# Patient Record
Sex: Male | Born: 1935 | Race: White | Hispanic: No | Marital: Married | Smoking: Never smoker
Health system: Southern US, Community
[De-identification: ages and names within clinical notes are randomized; demographics above are authoritative.]

## PROBLEM LIST (undated history)

## (undated) DIAGNOSIS — I420 Dilated cardiomyopathy: Secondary | ICD-10-CM

## (undated) DIAGNOSIS — I10 Essential (primary) hypertension: Secondary | ICD-10-CM

## (undated) DIAGNOSIS — I209 Angina pectoris, unspecified: Secondary | ICD-10-CM

## (undated) DIAGNOSIS — E785 Hyperlipidemia, unspecified: Secondary | ICD-10-CM

## (undated) DIAGNOSIS — I8393 Asymptomatic varicose veins of bilateral lower extremities: Secondary | ICD-10-CM

## (undated) DIAGNOSIS — I252 Old myocardial infarction: Secondary | ICD-10-CM

## (undated) DIAGNOSIS — M109 Gout, unspecified: Secondary | ICD-10-CM

## (undated) DIAGNOSIS — I2510 Atherosclerotic heart disease of native coronary artery without angina pectoris: Secondary | ICD-10-CM

## (undated) DIAGNOSIS — I255 Ischemic cardiomyopathy: Secondary | ICD-10-CM

## (undated) DIAGNOSIS — I5042 Chronic combined systolic (congestive) and diastolic (congestive) heart failure: Secondary | ICD-10-CM

## (undated) DIAGNOSIS — E039 Hypothyroidism, unspecified: Secondary | ICD-10-CM

## (undated) HISTORY — PX: CHOLECYSTECTOMY: SHX55

## (undated) HISTORY — PX: VASCULAR SURGERY: SHX849

---

## 2012-03-09 DEATH — deceased

## 2012-10-09 DIAGNOSIS — I5042 Chronic combined systolic (congestive) and diastolic (congestive) heart failure: Secondary | ICD-10-CM

## 2012-10-09 HISTORY — DX: Chronic combined systolic (congestive) and diastolic (congestive) heart failure: I50.42

## 2017-04-27 ENCOUNTER — Emergency Department (HOSPITAL_COMMUNITY): Payer: Medicare (Managed Care)

## 2017-04-27 ENCOUNTER — Inpatient Hospital Stay (HOSPITAL_COMMUNITY): Payer: Medicare (Managed Care)

## 2017-04-27 ENCOUNTER — Encounter (HOSPITAL_COMMUNITY): Payer: Self-pay | Admitting: Emergency Medicine

## 2017-04-27 ENCOUNTER — Other Ambulatory Visit: Payer: Self-pay

## 2017-04-27 ENCOUNTER — Inpatient Hospital Stay (HOSPITAL_COMMUNITY)
Admission: EM | Admit: 2017-04-27 | Discharge: 2017-05-15 | DRG: 233 | Disposition: A | Discharge status: Home Health | Payer: Medicare (Managed Care) | Attending: Thoracic Surgery (Cardiothoracic Vascular Surgery) | Admitting: Thoracic Surgery (Cardiothoracic Vascular Surgery)

## 2017-04-27 DIAGNOSIS — E039 Hypothyroidism, unspecified: Secondary | ICD-10-CM | POA: Diagnosis present

## 2017-04-27 DIAGNOSIS — I420 Dilated cardiomyopathy: Secondary | ICD-10-CM | POA: Diagnosis present

## 2017-04-27 DIAGNOSIS — R310 Gross hematuria: Secondary | ICD-10-CM | POA: Diagnosis not present

## 2017-04-27 DIAGNOSIS — Z8679 Personal history of other diseases of the circulatory system: Secondary | ICD-10-CM | POA: Diagnosis not present

## 2017-04-27 DIAGNOSIS — Z955 Presence of coronary angioplasty implant and graft: Secondary | ICD-10-CM | POA: Diagnosis not present

## 2017-04-27 DIAGNOSIS — R748 Abnormal levels of other serum enzymes: Secondary | ICD-10-CM

## 2017-04-27 DIAGNOSIS — I472 Ventricular tachycardia: Secondary | ICD-10-CM | POA: Diagnosis present

## 2017-04-27 DIAGNOSIS — M109 Gout, unspecified: Secondary | ICD-10-CM | POA: Diagnosis present

## 2017-04-27 DIAGNOSIS — I350 Nonrheumatic aortic (valve) stenosis: Secondary | ICD-10-CM | POA: Diagnosis present

## 2017-04-27 DIAGNOSIS — I4510 Unspecified right bundle-branch block: Secondary | ICD-10-CM | POA: Diagnosis present

## 2017-04-27 DIAGNOSIS — I252 Old myocardial infarction: Secondary | ICD-10-CM

## 2017-04-27 DIAGNOSIS — I5043 Acute on chronic combined systolic (congestive) and diastolic (congestive) heart failure: Secondary | ICD-10-CM

## 2017-04-27 DIAGNOSIS — Z8249 Family history of ischemic heart disease and other diseases of the circulatory system: Secondary | ICD-10-CM | POA: Diagnosis not present

## 2017-04-27 DIAGNOSIS — I10 Essential (primary) hypertension: Secondary | ICD-10-CM | POA: Diagnosis present

## 2017-04-27 DIAGNOSIS — R739 Hyperglycemia, unspecified: Secondary | ICD-10-CM | POA: Diagnosis present

## 2017-04-27 DIAGNOSIS — Z01810 Encounter for preprocedural cardiovascular examination: Secondary | ICD-10-CM | POA: Diagnosis not present

## 2017-04-27 DIAGNOSIS — I4892 Unspecified atrial flutter: Secondary | ICD-10-CM | POA: Diagnosis present

## 2017-04-27 DIAGNOSIS — I509 Heart failure, unspecified: Secondary | ICD-10-CM | POA: Diagnosis not present

## 2017-04-27 DIAGNOSIS — J810 Acute pulmonary edema: Secondary | ICD-10-CM

## 2017-04-27 DIAGNOSIS — I110 Hypertensive heart disease with heart failure: Secondary | ICD-10-CM | POA: Diagnosis present

## 2017-04-27 DIAGNOSIS — Z8739 Personal history of other diseases of the musculoskeletal system and connective tissue: Secondary | ICD-10-CM | POA: Diagnosis not present

## 2017-04-27 DIAGNOSIS — I255 Ischemic cardiomyopathy: Secondary | ICD-10-CM | POA: Diagnosis present

## 2017-04-27 DIAGNOSIS — I481 Persistent atrial fibrillation: Secondary | ICD-10-CM | POA: Diagnosis not present

## 2017-04-27 DIAGNOSIS — Z8639 Personal history of other endocrine, nutritional and metabolic disease: Secondary | ICD-10-CM | POA: Diagnosis not present

## 2017-04-27 DIAGNOSIS — I361 Nonrheumatic tricuspid (valve) insufficiency: Secondary | ICD-10-CM | POA: Diagnosis not present

## 2017-04-27 DIAGNOSIS — I77810 Thoracic aortic ectasia: Secondary | ICD-10-CM | POA: Diagnosis present

## 2017-04-27 DIAGNOSIS — Z79899 Other long term (current) drug therapy: Secondary | ICD-10-CM

## 2017-04-27 DIAGNOSIS — D62 Acute posthemorrhagic anemia: Secondary | ICD-10-CM | POA: Diagnosis not present

## 2017-04-27 DIAGNOSIS — I25110 Atherosclerotic heart disease of native coronary artery with unstable angina pectoris: Secondary | ICD-10-CM | POA: Diagnosis present

## 2017-04-27 DIAGNOSIS — I2510 Atherosclerotic heart disease of native coronary artery without angina pectoris: Secondary | ICD-10-CM

## 2017-04-27 DIAGNOSIS — I4891 Unspecified atrial fibrillation: Secondary | ICD-10-CM | POA: Diagnosis not present

## 2017-04-27 DIAGNOSIS — G4733 Obstructive sleep apnea (adult) (pediatric): Secondary | ICD-10-CM | POA: Diagnosis present

## 2017-04-27 DIAGNOSIS — J9811 Atelectasis: Secondary | ICD-10-CM | POA: Diagnosis not present

## 2017-04-27 DIAGNOSIS — M1A9XX0 Chronic gout, unspecified, without tophus (tophi): Secondary | ICD-10-CM

## 2017-04-27 DIAGNOSIS — I214 Non-ST elevation (NSTEMI) myocardial infarction: Secondary | ICD-10-CM | POA: Diagnosis present

## 2017-04-27 DIAGNOSIS — E785 Hyperlipidemia, unspecified: Secondary | ICD-10-CM

## 2017-04-27 DIAGNOSIS — R0600 Dyspnea, unspecified: Secondary | ICD-10-CM | POA: Diagnosis present

## 2017-04-27 DIAGNOSIS — Z7902 Long term (current) use of antithrombotics/antiplatelets: Secondary | ICD-10-CM

## 2017-04-27 DIAGNOSIS — J9601 Acute respiratory failure with hypoxia: Secondary | ICD-10-CM | POA: Diagnosis present

## 2017-04-27 DIAGNOSIS — I5042 Chronic combined systolic (congestive) and diastolic (congestive) heart failure: Secondary | ICD-10-CM | POA: Diagnosis not present

## 2017-04-27 DIAGNOSIS — R0902 Hypoxemia: Secondary | ICD-10-CM

## 2017-04-27 DIAGNOSIS — I351 Nonrheumatic aortic (valve) insufficiency: Secondary | ICD-10-CM | POA: Diagnosis not present

## 2017-04-27 DIAGNOSIS — Z951 Presence of aortocoronary bypass graft: Secondary | ICD-10-CM

## 2017-04-27 DIAGNOSIS — R079 Chest pain, unspecified: Secondary | ICD-10-CM

## 2017-04-27 DIAGNOSIS — Z7982 Long term (current) use of aspirin: Secondary | ICD-10-CM

## 2017-04-27 DIAGNOSIS — I8393 Asymptomatic varicose veins of bilateral lower extremities: Secondary | ICD-10-CM | POA: Diagnosis present

## 2017-04-27 DIAGNOSIS — I25118 Atherosclerotic heart disease of native coronary artery with other forms of angina pectoris: Secondary | ICD-10-CM

## 2017-04-27 HISTORY — DX: Dilated cardiomyopathy: I42.0

## 2017-04-27 HISTORY — DX: Hyperlipidemia, unspecified: E78.5

## 2017-04-27 HISTORY — DX: Old myocardial infarction: I25.2

## 2017-04-27 HISTORY — DX: Gout, unspecified: M10.9

## 2017-04-27 HISTORY — DX: Ischemic cardiomyopathy: I25.5

## 2017-04-27 HISTORY — DX: Hypothyroidism, unspecified: E03.9

## 2017-04-27 HISTORY — DX: Chronic combined systolic (congestive) and diastolic (congestive) heart failure: I50.42

## 2017-04-27 HISTORY — DX: Essential (primary) hypertension: I10

## 2017-04-27 HISTORY — DX: Atherosclerotic heart disease of native coronary artery without angina pectoris: I25.10

## 2017-04-27 HISTORY — DX: Asymptomatic varicose veins of bilateral lower extremities: I83.93

## 2017-04-27 HISTORY — DX: Angina pectoris, unspecified: I20.9

## 2017-04-27 LAB — HEPATIC FUNCTION PANEL
ALBUMIN: 4.3 g/dL (ref 3.5–5.0)
ALT: 22 U/L (ref 17–63)
AST: 28 U/L (ref 15–41)
Alkaline Phosphatase: 71 U/L (ref 38–126)
BILIRUBIN INDIRECT: 0.9 mg/dL (ref 0.3–0.9)
Bilirubin, Direct: 0.1 mg/dL (ref 0.1–0.5)
TOTAL PROTEIN: 7.6 g/dL (ref 6.5–8.1)
Total Bilirubin: 1 mg/dL (ref 0.3–1.2)

## 2017-04-27 LAB — TROPONIN I: Troponin I: 37.52 ng/mL (ref <0.03)

## 2017-04-27 LAB — POCT I-STAT TROPONIN I: Troponin i, poc: 28.77 ng/mL (ref 0.00–0.08)

## 2017-04-27 LAB — D-DIMER, QUANTITATIVE: D-Dimer, Quant: 9.17 ug/mL-FEU — ABNORMAL HIGH (ref 0.00–0.50)

## 2017-04-27 LAB — ECHOCARDIOGRAM COMPLETE
Height: 71 in
Weight: 3121.71 oz

## 2017-04-27 LAB — CBG MONITORING, ED: GLUCOSE-CAPILLARY: 185 mg/dL — AB (ref 65–99)

## 2017-04-27 LAB — BASIC METABOLIC PANEL
ANION GAP: 14 (ref 5–15)
BUN: 20 mg/dL (ref 6–20)
CALCIUM: 8.9 mg/dL (ref 8.9–10.3)
CO2: 22 mmol/L (ref 22–32)
Chloride: 105 mmol/L (ref 101–111)
Creatinine, Ser: 1.15 mg/dL (ref 0.61–1.24)
GFR calc Af Amer: >60 mL/min (ref >60)
GFR, EST NON AFRICAN AMERICAN: 55 mL/min — AB (ref >60)
GLUCOSE: 213 mg/dL — AB (ref 65–99)
POTASSIUM: 4.1 mmol/L (ref 3.5–5.1)
SODIUM: 141 mmol/L (ref 135–145)

## 2017-04-27 LAB — CBC
HCT: 50.3 % (ref 39.0–52.0)
Hemoglobin: 16.6 g/dL (ref 13.0–17.0)
MCH: 30.9 pg (ref 26.0–34.0)
MCHC: 33 g/dL (ref 30.0–36.0)
MCV: 93.5 fL (ref 78.0–100.0)
PLATELETS: 159 10*3/uL (ref 150–400)
RBC: 5.38 MIL/uL (ref 4.22–5.81)
RDW: 13.8 % (ref 11.5–15.5)
WBC: 10.6 10*3/uL — AB (ref 4.0–10.5)

## 2017-04-27 LAB — I-STAT TROPONIN, ED: TROPONIN I, POC: 0.03 ng/mL (ref 0.00–0.08)

## 2017-04-27 LAB — BRAIN NATRIURETIC PEPTIDE: B Natriuretic Peptide: 608 pg/mL — ABNORMAL HIGH (ref 0.0–100.0)

## 2017-04-27 LAB — TSH: TSH: 0.713 u[IU]/mL (ref 0.350–4.500)

## 2017-04-27 MED ORDER — ENOXAPARIN SODIUM 100 MG/ML ~~LOC~~ SOLN
1.0000 mg/kg | Freq: Two times a day (BID) | SUBCUTANEOUS | Status: DC
  Administered 2017-04-27: 90 mg via SUBCUTANEOUS
  Filled 2017-04-27: qty 1

## 2017-04-27 MED ORDER — IOPAMIDOL (ISOVUE-370) INJECTION 76%
100.0000 mL | Freq: Once | INTRAVENOUS | Status: AC | PRN
  Administered 2017-04-27: 100 mL via INTRAVENOUS

## 2017-04-27 MED ORDER — HEPARIN (PORCINE) IN NACL 100-0.45 UNIT/ML-% IJ SOLN
1200.0000 [IU]/h | INTRAMUSCULAR | Status: DC
  Administered 2017-04-28 – 2017-04-29 (×2): 1000 [IU]/h via INTRAVENOUS
  Administered 2017-04-30: 1200 [IU]/h via INTRAVENOUS
  Filled 2017-04-27 (×3): qty 250

## 2017-04-27 MED ORDER — PRAVASTATIN SODIUM 40 MG PO TABS
40.0000 mg | ORAL_TABLET | Freq: Every day | ORAL | Status: DC
  Administered 2017-04-27: 40 mg via ORAL
  Filled 2017-04-27: qty 1

## 2017-04-27 MED ORDER — FLUTICASONE PROPIONATE 0.005 % EX OINT
TOPICAL_OINTMENT | Freq: Two times a day (BID) | CUTANEOUS | Status: DC
  Administered 2017-04-27: 17:00:00 via TOPICAL
  Administered 2017-04-28: 1 via TOPICAL
  Administered 2017-04-28 – 2017-05-06 (×17): via TOPICAL
  Filled 2017-04-27 (×2): qty 30

## 2017-04-27 MED ORDER — ATORVASTATIN CALCIUM 80 MG PO TABS
80.0000 mg | ORAL_TABLET | Freq: Every day | ORAL | Status: DC
  Administered 2017-04-28 – 2017-05-14 (×15): 80 mg via ORAL
  Filled 2017-04-27 (×15): qty 1

## 2017-04-27 MED ORDER — ASPIRIN 81 MG PO CHEW
324.0000 mg | CHEWABLE_TABLET | Freq: Once | ORAL | Status: AC
  Administered 2017-04-27: 324 mg via ORAL
  Filled 2017-04-27: qty 4

## 2017-04-27 MED ORDER — FLUTICASONE PROPIONATE 0.05 % EX CREA: TOPICAL_CREAM | Freq: Two times a day (BID) | CUTANEOUS | Status: DC

## 2017-04-27 MED ORDER — ADULT MULTIVITAMIN W/MINERALS CH
1.0000 | ORAL_TABLET | Freq: Every day | ORAL | Status: DC
  Administered 2017-04-27 – 2017-05-11 (×14): 1 via ORAL
  Filled 2017-04-27 (×14): qty 1

## 2017-04-27 MED ORDER — SODIUM CHLORIDE 0.9% FLUSH
3.0000 mL | Freq: Two times a day (BID) | INTRAVENOUS | Status: DC
  Administered 2017-04-27 – 2017-05-07 (×14): 3 mL via INTRAVENOUS

## 2017-04-27 MED ORDER — ASPIRIN EC 81 MG PO TBEC
81.0000 mg | DELAYED_RELEASE_TABLET | Freq: Every day | ORAL | Status: DC
  Administered 2017-04-28 – 2017-05-08 (×10): 81 mg via ORAL
  Filled 2017-04-27 (×10): qty 1

## 2017-04-27 MED ORDER — ALLOPURINOL 100 MG PO TABS
100.0000 mg | ORAL_TABLET | Freq: Every day | ORAL | Status: DC
Start: 2017-04-27 — End: 2017-05-15
  Administered 2017-04-27 – 2017-05-15 (×18): 100 mg via ORAL
  Filled 2017-04-27 (×18): qty 1

## 2017-04-27 MED ORDER — ENALAPRIL MALEATE 5 MG PO TABS
5.0000 mg | ORAL_TABLET | Freq: Every day | ORAL | Status: DC
  Administered 2017-04-27 – 2017-05-02 (×6): 5 mg via ORAL
  Filled 2017-04-27 (×6): qty 1

## 2017-04-27 MED ORDER — NITROGLYCERIN 0.4 MG SL SUBL
0.4000 mg | SUBLINGUAL_TABLET | SUBLINGUAL | Status: DC | PRN
  Administered 2017-04-28 – 2017-05-03 (×3): 0.4 mg via SUBLINGUAL
  Filled 2017-04-27 (×4): qty 1

## 2017-04-27 MED ORDER — NITROGLYCERIN 2 % TD OINT
0.5000 [in_us] | TOPICAL_OINTMENT | Freq: Four times a day (QID) | TRANSDERMAL | Status: DC
  Administered 2017-04-27: 0.5 [in_us] via TOPICAL
  Filled 2017-04-27: qty 1
  Filled 2017-04-27: qty 30

## 2017-04-27 MED ORDER — FUROSEMIDE 10 MG/ML IJ SOLN
20.0000 mg | Freq: Once | INTRAMUSCULAR | Status: AC
  Administered 2017-04-27: 20 mg via INTRAVENOUS
  Filled 2017-04-27: qty 2

## 2017-04-27 MED ORDER — POTASSIUM CHLORIDE CRYS ER 20 MEQ PO TBCR
20.0000 meq | EXTENDED_RELEASE_TABLET | Freq: Two times a day (BID) | ORAL | Status: DC
  Administered 2017-04-27 (×2): 20 meq via ORAL
  Filled 2017-04-27 (×2): qty 1

## 2017-04-27 MED ORDER — SODIUM CHLORIDE 0.9% FLUSH: 3.0000 mL | INTRAVENOUS | Status: DC | PRN

## 2017-04-27 MED ORDER — IPRATROPIUM BROMIDE 0.02 % IN SOLN
RESPIRATORY_TRACT | Status: AC
  Administered 2017-04-27: 0.5 mg
  Filled 2017-04-27: qty 2.5

## 2017-04-27 MED ORDER — ACETAMINOPHEN 325 MG PO TABS
650.0000 mg | ORAL_TABLET | ORAL | Status: DC | PRN
  Administered 2017-05-03: 650 mg via ORAL
  Filled 2017-04-27: qty 2

## 2017-04-27 MED ORDER — ONDANSETRON HCL 4 MG/2ML IJ SOLN
4.0000 mg | Freq: Four times a day (QID) | INTRAMUSCULAR | Status: DC | PRN
  Administered 2017-04-29: 4 mg via INTRAVENOUS
  Filled 2017-04-27: qty 2

## 2017-04-27 MED ORDER — LEVOTHYROXINE SODIUM 75 MCG PO TABS
150.0000 ug | ORAL_TABLET | Freq: Every day | ORAL | Status: DC
  Administered 2017-04-28 – 2017-05-15 (×17): 150 ug via ORAL
  Filled 2017-04-27: qty 6
  Filled 2017-04-27 (×16): qty 2

## 2017-04-27 MED ORDER — LEVALBUTEROL HCL 1.25 MG/0.5ML IN NEBU
INHALATION_SOLUTION | RESPIRATORY_TRACT | Status: AC
  Administered 2017-04-27: 1.25 mg
  Filled 2017-04-27: qty 0.5

## 2017-04-27 MED ORDER — ASPIRIN 81 MG PO CHEW
81.0000 mg | CHEWABLE_TABLET | Freq: Every day | ORAL | Status: DC
  Administered 2017-04-27: 81 mg via ORAL
  Filled 2017-04-27: qty 1

## 2017-04-27 MED ORDER — FUROSEMIDE 10 MG/ML IJ SOLN
40.0000 mg | Freq: Two times a day (BID) | INTRAMUSCULAR | Status: DC
  Administered 2017-04-27: 40 mg via INTRAVENOUS
  Filled 2017-04-27: qty 4

## 2017-04-27 MED ORDER — NITROGLYCERIN IN D5W 200-5 MCG/ML-% IV SOLN
10.0000 ug/min | INTRAVENOUS | Status: DC
  Administered 2017-04-27: 10 ug/min via INTRAVENOUS
  Filled 2017-04-27: qty 250

## 2017-04-27 MED ORDER — SODIUM CHLORIDE 0.9 % IV SOLN: 250.0000 mL | INTRAVENOUS | Status: DC | PRN

## 2017-04-27 MED ORDER — FUROSEMIDE 10 MG/ML IJ SOLN: 40.0000 mg | Freq: Once | INTRAMUSCULAR | Status: DC

## 2017-04-27 MED ORDER — CARVEDILOL 6.25 MG PO TABS
6.2500 mg | ORAL_TABLET | Freq: Two times a day (BID) | ORAL | Status: DC
  Administered 2017-04-27 – 2017-05-09 (×23): 6.25 mg via ORAL
  Filled 2017-04-27: qty 2
  Filled 2017-04-27 (×21): qty 1
  Filled 2017-04-27: qty 2

## 2017-04-27 NOTE — Progress Notes (Signed)
Addendum:  CT angiogram was negative for pulmonary embolism. RN paged me with markedly elevated troponin. Patient already on therapeutic dose Lovenox due to high pretest probability of pulmonary embolism. We'll add Nitropaste. Called and spoke with Dr. Dietrich Pates to upgrade consultation to urgent request. Updated family and the patient about concerns that he may have had a heart attack.  [name] 04/27/2017 3:57 PM

## 2017-04-27 NOTE — ED Notes (Signed)
Patient helped to bedside commode. 

## 2017-04-27 NOTE — ED Triage Notes (Signed)
Patient start having chest pain and shortness of breath a couple hours ago, patient notably winded and diaphoretic.

## 2017-04-27 NOTE — H&P (Signed)
History and Physical:    Scott [name]   ZOX:[id number] DOB: 12/01/1926 DOA: 04/27/2017  Referring MD/provider: Melene Plan, DO PCP: System, Pcp Not In  Dr. Maple Hudson in [place]  Patient coming from: [facility]. Lives in [place].  Chief Complaint: Dyspnea  History of Present Illness:   Scott [name] is an 83 y.o. male with a PMH of congestive heart failure, coronary artery disease status post MI and stents 2, obstructive sleep apnea on CPAP for the past month, history of angina, history of controlled hypertension and hypothyroidism who presents to the ED with a chief complaint of the sudden onset of extreme dyspnea that began approximately at 2:15 AM after he got up to use the bathroom in the middle of the night. The patient denies any prior history of orthopnea or paroxysmal nocturnal dyspnea prior to the onset of these symptoms. Interestingly, he and his wife traveled here from [place] earlier this week, and there were multiple flight delays resulting in prolonged travel.  ED Course:  The patient was noted to be cyanotic, hypertensive, tachycardic, tachypneic and hypoxic upon arrival. He was placed on BiPAP, given 20 mg of Lasix IV, and placed on a nitroglycerin drip. BNP was 608, troponin was 0.03. There has been improvement in his symptoms since that time, and he has been weaned to nasal cannula.  ROS:   Review of Systems  Constitutional: Negative for chills, fever and weight loss.  HENT: Negative.   Eyes: Negative.   Respiratory: Positive for shortness of breath and wheezing. Negative for cough.   Cardiovascular: Negative for chest pain, palpitations, orthopnea, claudication, leg swelling and PND.  Gastrointestinal: Positive for diarrhea. Negative for blood in stool, melena, nausea and vomiting.  Genitourinary: Negative.   Musculoskeletal: Negative.   Skin: Positive for rash.       Nickel sized patch of erythema with flaking skin to right upper chest  Neurological:  Negative.   Endo/Heme/Allergies: Negative.   Psychiatric/Behavioral: The patient is nervous/anxious.     Past Medical History:   Past Medical History:  Diagnosis Date  . Angina pectoris (HCC)   . CHF (congestive heart failure) (HCC)   . Coronary artery disease   . Essential hypertension   . Gout   . History of MI (myocardial infarction)   . Hyperlipemia   . Hyperlipidemia   . Hypothyroidism   . Ischemic dilated cardiomyopathy (HCC)   . Multi-vessel coronary artery stenosis   . Varicose veins of both lower extremities     Past Surgical History:   Past Surgical History:  Procedure Laterality Date  . CHOLECYSTECTOMY    . VASCULAR SURGERY     Vein stripping    Social History:   Social History   Social History  . Marital status: Married    Spouse name: N/A  . Number of children: 3  . Years of education: N/A   Occupational History  . Not on file.   Social History Main Topics  . Smoking status: Never Smoker  . Smokeless tobacco: Not on file  . Alcohol use Yes     Comment: Social  . Drug use: No  . Sexual activity: Not on file   Other Topics Concern  . Not on file   Social History Narrative   Remarried, with current wife 1 year. Has 3 children with his former wife. Visiting from [place].    Allergies   Patient has no known allergies.  Family history:   Family History  Problem Relation Age  of Onset  . Heart disease Mother        Pacemaker  . Bladder Cancer Father   . Bladder Cancer Brother   . Hypertension Son     Current Medications:   Prior to Admission medications   Medication Sig Start Date End Date Taking? Authorizing Provider  allopurinol (ZYLOPRIM) 100 MG tablet Take 100 mg by mouth daily.   Yes [provider]  aspirin 81 MG chewable tablet Chew 81 mg by mouth daily.   Yes [provider]  carvedilol (COREG) 6.25 MG tablet Take 6.25 mg by mouth 2 (two) times daily with a meal.   Yes [provider]  enalapril  (VASOTEC) 5 MG tablet Take 5 mg by mouth at bedtime.   Yes [provider]  fluticasone (CUTIVATE) 0.05 % cream Apply topically 2 (two) times daily.   Yes [provider]  isosorbide mononitrate (IMDUR) 30 MG 24 hr tablet Take 30 mg by mouth daily.   Yes [provider]  levothyroxine (SYNTHROID, LEVOTHROID) 150 MCG tablet Take 150 mcg by mouth daily before breakfast.   Yes [provider]  Multiple Vitamin (MULTIVITAMIN) tablet Take 1 tablet by mouth daily.   Yes [provider]  nitroGLYCERIN (NITROSTAT) 0.4 MG SL tablet Place 0.4 mg under the tongue every 5 (five) minutes as needed for chest pain.   Yes [provider]  pravastatin (PRAVACHOL) 40 MG tablet Take 40 mg by mouth at bedtime.   Yes [provider]    Physical Exam:   Vitals:   04/27/17 0545 04/27/17 0600 04/27/17 0630 04/27/17 0630  BP: 106/67 128/72 (!) 146/72 128/72  Pulse: 68 69 74 72  Resp: (!) 22 20  (!) 23  SpO2: 97% 94% 93% 94%  Weight:      Height:         Physical Exam: Blood pressure 128/72, pulse 72, resp. rate (!) 23, height 5\' 11"  (1.803 m), weight 88 kg (194 lb), SpO2 94 %. Gen: Well-developed, well-nourished male. Mildly short of breath on initial appearance.  Head: Normocephalic, atraumatic. Eyes: Pupils equal, round and reactive to light. Extraocular movements intact.  Sclerae nonicteric. No lid lag. Mouth: Oropharynx reveals moist mucous membranes. Dentition is fair, bridge to the upper palate. Neck: Supple, no thyromegaly, no lymphadenopathy, very mild jugular venous distention. Chest: Lungs are coarse with diffuse expiratory wheezes. No rales or rhonchi. CV: Heart sounds are slightly irregular with premature beats. Grade 2/6 systolic murmur. Abdomen: Soft, nontender, nondistended with normal active bowel sounds. No hepatosplenomegaly or palpable masses. Extremities: Lower extremities are mildly cyanotic with extensive varicosities and  hemosiderin deposits. Skin: Warm and dry. Small patch of what appears to be eczema right upper chest. Neuro: Alert and oriented times 3; cranial nerves II through XII are grossly intact. Moves all extremities 4 with equal strength. Psych: Insight is good and judgment is appropriate. Mood and affect normal.   Data Review:    Labs: Basic Metabolic Panel:  Recent Labs Lab 04/27/17 0444  NA 141  K 4.1  CL 105  CO2 22  GLUCOSE 213*  BUN 20  CREATININE 1.15  CALCIUM 8.9   Liver Function Tests: No results for input(s): AST, ALT, ALKPHOS, BILITOT, PROT, ALBUMIN in the last 168 hours. No results for input(s): LIPASE, AMYLASE in the last 168 hours. No results for input(s): AMMONIA in the last 168 hours. CBC:  Recent Labs Lab 04/27/17 0444  WBC 10.6*  HGB 16.6  HCT 50.3  MCV  93.5  PLT 159   Cardiac Enzymes: No results for input(s): CKTOTAL, CKMB, CKMBINDEX, TROPONINI in the last 168 hours.  BNP (last 3 results) No results for input(s): PROBNP in the last 8760 hours. CBG:  Recent Labs Lab 04/27/17 0457  GLUCAP 185*    Urinalysis No results found for: COLORURINE, APPEARANCEUR, LABSPEC, PHURINE, GLUCOSEU, HGBUR, BILIRUBINUR, KETONESUR, PROTEINUR, UROBILINOGEN, NITRITE, LEUKOCYTESUR    Radiographic Studies:  Dg Chest Port 1 View 04/27/2017: Personally reviewed. Lung fields show interstitial opacities as pictured below.    EKG: Independently reviewed. Atrial flutter with an intraventricular conduction delay. PVCs. Q waves in V1.   Assessment/Plan:   Principal Problem:   Acute respiratory failure with hypoxia (HCC)In a patient with known ischemic dilated cardiomyopathy/CHF/CAD/history of MI Required BiPAP initially. Differential includes decompensated CHF, ACS, pulmonary embolism. Screening troponin reassuring, but with new atrial flutter and CHF, will need to rule out with serial enzymes. At high risk for pulmonary embolism given recent travel history, d-dimer  and stat CT angiogram of the chest have been ordered. We'll place on empiric Lovenox until ACS and pulmonary embolism have been ruled out. Continue aspirin, Coreg, and a nitroglycerin drip for now. Hold Imdur while on NTG drip. Cardiology consultation has been requested. 2-D echo also ordered.  Active Problems:   Atrial flutter/abnormal EKG Have placed on therapeutic dose Lovenox. Cycle cardiac enzymes. Cardiology consulted.    Hypothyroidism Check TSH and continue Synthroid for now.    Hyperlipidemia Continue statin.    Gout Continue allopurinol.    Essential hypertension Blood pressure markedly elevated on presentation, improved with nitroglycerin drip. Continue home antihypertensives and nitroglycerin drip for now.    Hyperglycemia No history of diabetes but glucose 213 on chemistries. Nonfasting sample. We'll check hemoglobin A1c.   Other information:   DVT prophylaxis: Lovenox ordered. Code Status: Full code. Family Communication: Wife at bedside.  Disposition Plan: Admit to telemetry, anticipate a 2-3 hospital stay for diagnostic testing and stabilization. Consults called: Cardiology. Admission status: Inpatient.   The medical decision making on this patient was of high complexity and the patient is at high risk for clinical deterioration, therefore this is a level 3 visit.   RAMA,CHRISTINA [facility] Pager [phone] Cell: [phone]   If 7PM-7AM, please contact night-coverage [email or web address] Password [email or web address] 04/27/2017, 8:29 AM

## 2017-04-27 NOTE — ED Provider Notes (Addendum)
[facility]-EMERGENCY DEPT Provider Note   CSN: [id number] Arrival date & time: 04/27/17  0439     History   Chief Complaint Chief Complaint  Patient presents with  . Shortness of Breath    HPI Scott [name] is a 83 y.o. male.  Level 5 caveat acute respiratory distress. The patient came in with approximately 2 hours of extreme shortness of breath.  Later history obtained from family.   The history is provided by the patient and the spouse.  Illness  This is a new problem. The current episode started 1 to 2 hours ago. The problem occurs constantly. The problem has been rapidly worsening. Associated symptoms include shortness of breath. Pertinent negatives include no chest pain, no abdominal pain and no headaches. Nothing aggravates the symptoms. Nothing relieves the symptoms. He has tried nothing for the symptoms. The treatment provided no relief.    Past Medical History:  Diagnosis Date  . Angina pectoris (HCC)   . CHF (congestive heart failure) (HCC)   . Coronary artery disease   . History of MI (myocardial infarction)   . Hyperlipemia   . Ischemic dilated cardiomyopathy (HCC)   . Multi-vessel coronary artery stenosis     There are no active problems to display for this patient.   No past surgical history on file.     Home Medications    Prior to Admission medications   Medication Sig Start Date End Date Taking? Authorizing Provider  allopurinol (ZYLOPRIM) 100 MG tablet Take 100 mg by mouth daily.   Yes [provider]  aspirin 81 MG chewable tablet Chew 81 mg by mouth daily.   Yes [provider]  carvedilol (COREG) 6.25 MG tablet Take 6.25 mg by mouth 2 (two) times daily with a meal.   Yes [provider]  enalapril (VASOTEC) 5 MG tablet Take 5 mg by mouth at bedtime.   Yes [provider]  fluticasone (CUTIVATE) 0.05 % cream Apply topically 2 (two) times daily.   Yes [provider]  isosorbide mononitrate (IMDUR) 30  MG 24 hr tablet Take 30 mg by mouth daily.   Yes [provider]  levothyroxine (SYNTHROID, LEVOTHROID) 150 MCG tablet Take 150 mcg by mouth daily before breakfast.   Yes [provider]  Multiple Vitamin (MULTIVITAMIN) tablet Take 1 tablet by mouth daily.   Yes [provider]  nitroGLYCERIN (NITROSTAT) 0.4 MG SL tablet Place 0.4 mg under the tongue every 5 (five) minutes as needed for chest pain.   Yes [provider]  pravastatin (PRAVACHOL) 40 MG tablet Take 40 mg by mouth at bedtime.   Yes [provider]    Family History No family history on file.  Social History Social History  Substance Use Topics  . Smoking status: Not on file  . Smokeless tobacco: Not on file  . Alcohol use Not on file     Allergies   Patient has no known allergies.   Review of Systems Review of Systems  Unable to perform ROS: Acuity of condition  Constitutional: Negative for chills and fever.  HENT: Negative for congestion and facial swelling.   Eyes: Negative for discharge and visual disturbance.  Respiratory: Positive for shortness of breath.   Cardiovascular: Negative for chest pain and palpitations.  Gastrointestinal: Negative for abdominal pain, diarrhea and vomiting.  Musculoskeletal: Negative for arthralgias and myalgias.  Skin: Negative for color change and rash.  Neurological: Negative for tremors, syncope and headaches.  Psychiatric/Behavioral: Negative for confusion and dysphoric  mood.     Physical Exam Updated Vital Signs BP (!) 167/95 Comment: Simultaneous filing. User may not have seen previous data.  Pulse 83 Comment: Simultaneous filing. User may not have seen previous data.  Resp (!) 35 Comment: Simultaneous filing. User may not have seen previous data.  Ht 5\' 11"  (1.803 m)   Wt 88 kg (194 lb)   SpO2 98% Comment: Simultaneous filing. User may not have seen previous data.  BMI 27.06 kg/m   Physical Exam  Constitutional: He is  oriented to person, place, and time. He appears well-developed and well-nourished. He appears distressed.  HENT:  Head: Normocephalic and atraumatic.  jvd up to the angle of the jaw  Eyes: Pupils are equal, round, and reactive to light. EOM are normal.  Neck: Normal range of motion. Neck supple. No JVD present.  Cardiovascular: Normal rate and regular rhythm.  Exam reveals no gallop and no friction rub.   No murmur heard. Pulmonary/Chest: No respiratory distress. He has wheezes.  Diffuse rales up to the top of the lungs   Abdominal: He exhibits no distension and no mass. There is no tenderness. There is no rebound and no guarding.  Musculoskeletal: Normal range of motion. He exhibits edema (1+ to the knees).  Neurological: He is alert and oriented to person, place, and time.  Skin: No rash noted. He is diaphoretic. No pallor.  Psychiatric: He has a normal mood and affect. His behavior is normal.  Nursing note and vitals reviewed.    ED Treatments / Results  Labs (all labs ordered are listed, but only abnormal results are displayed) Labs Reviewed  BASIC METABOLIC PANEL - Abnormal; Notable for the following:       Result Value   Glucose, Bld 213 (*)    GFR calc non Af Amer 55 (*)    All other components within normal limits  CBC - Abnormal; Notable for the following:    WBC 10.6 (*)    All other components within normal limits  BRAIN NATRIURETIC PEPTIDE - Abnormal; Notable for the following:    B Natriuretic Peptide 608.0 (*)    All other components within normal limits  CBG MONITORING, ED - Abnormal; Notable for the following:    Glucose-Capillary 185 (*)    All other components within normal limits  I-STAT TROPONIN, ED    EKG  EKG Interpretation None       Radiology Dg Chest Port 1 View  Result Date: 04/27/2017 CLINICAL DATA:  Shortness of breath and chest pain EXAM: PORTABLE CHEST 1 VIEW COMPARISON:  None. FINDINGS: There are extensive bilateral interstitial  opacities. Cardiomediastinal contours are normal. No pneumothorax or sizable pleural effusion. IMPRESSION: Extensive bilateral interstitial opacities which may indicate moderate interstitial pulmonary edema. In the absence of prior studies for comparison, interstitial lung disease would be difficult to exclude. Electronically Signed   By: Deatra Robinson M.D.   On: 04/27/2017 05:45    Procedures Procedures (including critical care time)  Medications Ordered in ED Medications  nitroGLYCERIN 50 mg in dextrose 5 % 250 mL (0.2 mg/mL) infusion (0 mcg/min Intravenous Stopped 04/27/17 0541)  aspirin chewable tablet 324 mg (324 mg Oral Given 04/27/17 0548)  ipratropium (ATROVENT) 0.02 % nebulizer solution (0.5 mg  Given 04/27/17 0459)  levalbuterol (XOPENEX) 1.25 MG/0.5ML nebulizer solution (1.25 mg  Given 04/27/17 0459)  furosemide (LASIX) injection 20 mg (20 mg Intravenous Given 04/27/17 0541)     Initial Impression / Assessment and Plan / ED Course  I have reviewed the triage vital signs and the nursing notes.  Pertinent labs & imaging results that were available during my care of the patient were reviewed by me and considered in my medical decision making (see chart for details).     83 yo M with a chief complaint of shortness of breath. The started acutely about 2 hours ago and significantly worsening. On arrival to the ED the patient was cyanotic diaphoretic and in respiratory distress. O2 sat was in the 60s. Exam with diffuse rales. Start on BiPAP with significant improvement. Noted to be hypertensive as well. Started on nitroglycerin drip. Chest x-ray consistent with acute pulmonary edema.  The patient's blood pressure titrated down on nitroglycerin. Patient's blood pressure now in the 120s over 70s. Taken off of BiPAP and tolerating nasal cannula well. Will discuss with the hospitalist.  CRITICAL CARE Performed by: Rae Roam   Total critical care time: 80 minutes  Critical care  time was exclusive of separately billable procedures and treating other patients.  Critical care was necessary to treat or prevent imminent or life-threatening deterioration.  Critical care was time spent personally by me on the following activities: development of treatment plan with patient and/or surrogate as well as nursing, discussions with consultants, evaluation of patient's response to treatment, examination of patient, obtaining history from patient or surrogate, ordering and performing treatments and interventions, ordering and review of laboratory studies, ordering and review of radiographic studies, pulse oximetry and re-evaluation of patient's condition.  The patients results and plan were reviewed and discussed.   Any x-rays performed were independently reviewed by myself.   Differential diagnosis were considered with the presenting HPI.  Medications  nitroGLYCERIN 50 mg in dextrose 5 % 250 mL (0.2 mg/mL) infusion (0 mcg/min Intravenous Stopped 04/27/17 0541)  aspirin chewable tablet 324 mg (324 mg Oral Given 04/27/17 0548)  ipratropium (ATROVENT) 0.02 % nebulizer solution (0.5 mg  Given 04/27/17 0459)  levalbuterol (XOPENEX) 1.25 MG/0.5ML nebulizer solution (1.25 mg  Given 04/27/17 0459)  furosemide (LASIX) injection 20 mg (20 mg Intravenous Given 04/27/17 0541)    Vitals:   04/27/17 0451 04/27/17 0452 04/27/17 0459 04/27/17 0500  BP: (!) 152/108   (!) 167/95  Pulse: (!) 101   83  Resp: (!) 29   (!) 35  SpO2: (!) 74%  98% 98%  Weight:  88 kg (194 lb)    Height:  5\' 11"  (1.803 m)      Final diagnoses:  Acute pulmonary edema (HCC)    Admission/ observation were discussed with the admitting physician, patient and/or family and they are comfortable with the plan.   Final Clinical Impressions(s) / ED Diagnoses   Final diagnoses:  Acute pulmonary edema [facility])    New Prescriptions New Prescriptions   No medications on file     Melene Plan, DO 04/27/17 1610    Melene Plan, DO 04/27/17 [phone]

## 2017-04-27 NOTE — Progress Notes (Signed)
Patient taken off BIPAP at 05:50 per MD. Patient's work of breathing is much better. He is laughing and smiling and states that he can breath much better. Placed on 4 lpm nasal cannula at this time and O2 sat is 100%. Family at bedside.

## 2017-04-27 NOTE — Progress Notes (Signed)
ANTICOAGULATION CONSULT NOTE - Initial Consult  Pharmacy Consult for Lovenox>>>Heparin  Indication: chest pain/ACS  No Known Allergies  Patient Measurements: Height: 5' 10.5" (179.1 cm) Weight: 188 lb 4.8 oz (85.4 kg) IBW/kg (Calculated) : 74.15  Vital Signs: Temp: 98.2 F (36.8 C) (07/20 2138) Temp Source: Oral (07/20 2138) BP: 146/61 (07/20 2138) Pulse Rate: 70 (07/20 2138)  Labs:  Recent Labs  04/27/17 0444 04/27/17 1512  HGB 16.6  --   HCT 50.3  --   PLT 159  --   CREATININE 1.15  --   TROPONINI  --  37.52*    Estimated Creatinine Clearance: 47.5 mL/min (by C-G formula based on SCr of 1.15 mg/dL).   Medical History: Past Medical History:  Diagnosis Date  . Angina pectoris (HCC)   . CHF (congestive heart failure) (HCC)   . Coronary artery disease   . Essential hypertension   . Gout   . History of MI (myocardial infarction)   . Hyperlipemia   . Hyperlipidemia   . Hypothyroidism   . Ischemic dilated cardiomyopathy (HCC)   . Multi-vessel coronary artery stenosis   . Varicose veins of both lower extremities     Assessment: 83 y/o M tx from [facility] for elevated troponin, transitioning from Lovenox to Heparin drip, CBC/renal function good, last dose of Lovenox was around 2000 today.  Goal of Therapy:  Heparin level 0.3-0.7 units/ml Monitor platelets by anticoagulation protocol: Yes   Plan:  -Start heparin drip at 1000 units/hr at 0800 on 7/21 -1600 HL -Daily CBC/HL -Monitor for bleeding  Abran Duke 04/27/2017,11:43 PM

## 2017-04-27 NOTE — Progress Notes (Signed)
ANTICOAGULATION CONSULT NOTE - Initial Consult  Pharmacy Consult for lovenox Indication: pulmonary embolus  No Known Allergies  Patient Measurements: Height: 5\' 11"  (180.3 cm) Weight: 194 lb (88 kg) IBW/kg (Calculated) : 75.3   Vital Signs: BP: 128/72 (07/20 0630) Pulse Rate: 72 (07/20 0630)  Labs:  Recent Labs  04/27/17 0444  HGB 16.6  HCT 50.3  PLT 159  CREATININE 1.15    Estimated Creatinine Clearance: 48.2 mL/min (by C-G formula based on SCr of 1.15 mg/dL).   Medical History: Past Medical History:  Diagnosis Date  . Angina pectoris (HCC)   . CHF (congestive heart failure) (HCC)   . Coronary artery disease   . Essential hypertension   . Gout   . History of MI (myocardial infarction)   . Hyperlipemia   . Hyperlipidemia   . Hypothyroidism   . Ischemic dilated cardiomyopathy (HCC)   . Multi-vessel coronary artery stenosis   . Varicose veins of both lower extremities     Medications:  See medication history  Assessment: 83 yo man to start lovenox r/o PE.   Goal of Therapy:  Anti-Xa level 0.6-1 units/ml 4hrs after LMWH dose given Monitor platelets by anticoagulation protocol: Yes   Plan:  Lovenox 90 mg sq q12 hours CBC q 3 days  Monitor for bleeding complications  Aisea Bouldin Poteet 04/27/2017,8:33 AM

## 2017-04-27 NOTE — Consult Note (Signed)
Primary Physician: Primary Cardiologist:  New  Dr Sol Blazing ([facility]    Asked by Dr Darnelle Catalan to see for elevated troponin  HPI:  Pt is an 83 yo male with history ofCAD, AS CHF   thypthryodism  OSA, HTN   He lives in [place]   Cardiologist there is Dr Truddie Hidden, [facility] OR  Pt says he had an MI in 1987  Stent in 1999  Stent in 2013 or 2014   Plavix was discontinued rel recnetly  Had been on over 2 years  His angina is back pain  By internal medicine note: LVEF 25 to 40%  Not a candidate for ICD About 1 month ago he went to local ER with back pain  Told he had fluid around the heart  Given Rx for lasix and K for 20 days   After finished he started having back pain (angina for him) again  He says he takes isosorbid bid to prevent this If misses has more back pain The patieht left [place] Tues to come to  for a family reunion  Long trip  Last night woke up with severe back pain and SOB  Came to ED How he is breathing better  Still with mild back discomfort   Initial troponin:  0.03  Second troponin 28. EKG and tele with SR with frequent PVCs    The pt says that he is still having some back pain 4/10  Breathing is better "since they started working on me"       Past Medical History:  Diagnosis Date  . Angina pectoris (HCC)   . CHF (congestive heart failure) (HCC)   . Coronary artery disease   . Essential hypertension   . Gout   . History of MI (myocardial infarction)   . Hyperlipemia   . Hyperlipidemia   . Hypothyroidism   . Ischemic dilated cardiomyopathy (HCC)   . Multi-vessel coronary artery stenosis   . Varicose veins of both lower extremities     Medications Prior to Admission  Medication Sig Dispense Refill  . allopurinol (ZYLOPRIM) 100 MG tablet Take 100 mg by mouth daily.    Marland Kitchen aspirin 81 MG chewable tablet Chew 81 mg by mouth daily.    Marland Kitchen CALCIUM CARBONATE PO Take 600 mg by mouth 2 (two) times daily.    . carvedilol (COREG) 6.25 MG tablet Take 6.25 mg by mouth 2  (two) times daily with a meal.    . Cholecalciferol (VITAMIN D-1000 MAX ST) 1000 units tablet Take 2,000 Units by mouth every morning.    . cyanocobalamin (TH VITAMIN B12) 100 MCG tablet Take 1 tablet by mouth daily. Take 1 tablet by mouth in the evening.    . enalapril (VASOTEC) 5 MG tablet Take 5 mg by mouth at bedtime.    . fluticasone (CUTIVATE) 0.05 % cream Apply topically 2 (two) times daily.    . isosorbide mononitrate (IMDUR) 30 MG 24 hr tablet Take 30 mg by mouth daily.    Marland Kitchen levothyroxine (SYNTHROID, LEVOTHROID) 150 MCG tablet Take 150 mcg by mouth daily before breakfast.    . Lutein 20 MG CAPS Take 1 capsule by mouth every morning.    . Multiple Vitamin (MULTIVITAMIN) tablet Take 1 tablet by mouth daily.    . nitroGLYCERIN (NITROSTAT) 0.4 MG SL tablet Place 0.4 mg under the tongue every 5 (five) minutes as needed for chest pain.    . pravastatin (PRAVACHOL) 40 MG tablet Take 40 mg by mouth at  bedtime.    Marland Kitchen Ubiquinol 100 MG CAPS Take 1 capsule by mouth every morning.       Marland Kitchen allopurinol  100 mg Oral Daily  . aspirin  81 mg Oral Daily  . carvedilol  6.25 mg Oral BID WC  . enalapril  5 mg Oral QHS  . enoxaparin (LOVENOX) injection  1 mg/kg Subcutaneous Q12H  . fluticasone   Topical BID  . furosemide  40 mg Intravenous BID  . [START ON 04/28/2017] levothyroxine  150 mcg Oral QAC breakfast  . multivitamin with minerals  1 tablet Oral Daily  . potassium chloride  20 mEq Oral BID  . pravastatin  40 mg Oral QHS  . sodium chloride flush  3 mL Intravenous Q12H    Infusions: . sodium chloride    . nitroGLYCERIN Stopped (04/27/17 0541)    No Known Allergies  Social History   Social History  . Marital status: Married    Spouse name: N/A  . Number of children: 3  . Years of education: N/A   Occupational History  . Not on file.   Social History Main Topics  . Smoking status: Never Smoker  . Smokeless tobacco: Not on file  . Alcohol use Yes     Comment: Social  . Drug  use: No  . Sexual activity: Not on file   Other Topics Concern  . Not on file   Social History Narrative   Remarried, with current wife 1 year. Has 3 children with his former wife. Visiting from [place].    Family History  Problem Relation Age of Onset  . Heart disease Mother        Pacemaker  . Bladder Cancer Father   . Bladder Cancer Brother   . Hypertension Son     REVIEW OF SYSTEMS:  All systems reviewed  Negative to the above problem except as noted above.    PHYSICAL EXAM: Vitals:   04/27/17 1400 04/27/17 1500  BP: (!) 149/83 133/72  Pulse: 73 67  Resp: (!) 28 (!) 21  Temp:       Intake/Output Summary (Last 24 hours) at 04/27/17 1546 Last data filed at 04/27/17 1434  Gross per 24 hour  Intake                0 ml  Output              625 ml  Net             -625 ml    General:  Well appearing. No respiratory difficulty HEENT: normal Neck: supple. no JVD. Carotids 2+ bilat; no bruits. No lymphadenopathy or thryomegaly appreciated. Cor:  Regular rate & rhythm. No rubs, gallops Gr II/VI systolic murmur at base   Lungs: clear Abdomen: soft, nontender, nondistended. No hepatosplenomegaly. No bruits or masses. Good bowel sounds. Extremities: no cyanosis, clubbing, rash, edema Neuro: alert & oriented x 3, cranial nerves grossly intact. moves all 4 extremities w/o difficulty. Affect pleasant.  ECG:  Sinus tachycardia  106 bpm  RBBB  LAFB   Ventricular bige  Results for orders placed or performed during the hospital encounter of 04/27/17 (from the past 24 hour(s))  Basic metabolic panel     Status: Abnormal   Collection Time: 04/27/17  4:44 AM  Result Value Ref Range   Sodium 141 135 - 145 mmol/L   Potassium 4.1 3.5 - 5.1 mmol/L   Chloride 105 101 - 111 mmol/L   CO2 22 22 -  32 mmol/L   Glucose, Bld 213 (H) 65 - 99 mg/dL   BUN 20 6 - 20 mg/dL   Creatinine, Ser 4.54 0.61 - 1.24 mg/dL   Calcium 8.9 8.9 - 09.8 mg/dL   GFR calc non Af Amer 55 (L) >60 mL/min   GFR  calc Af Amer >60 >60 mL/min   Anion gap 14 5 - 15  CBC     Status: Abnormal   Collection Time: 04/27/17  4:44 AM  Result Value Ref Range   WBC 10.6 (H) 4.0 - 10.5 K/uL   RBC 5.38 4.22 - 5.81 MIL/uL   Hemoglobin 16.6 13.0 - 17.0 g/dL   HCT 11.9 14.7 - 82.9 %   MCV 93.5 78.0 - 100.0 fL   MCH 30.9 26.0 - 34.0 pg   MCHC 33.0 30.0 - 36.0 g/dL   RDW 56.2 13.0 - 86.5 %   Platelets 159 150 - 400 K/uL  Brain natriuretic peptide     Status: Abnormal   Collection Time: 04/27/17  4:44 AM  Result Value Ref Range   B Natriuretic Peptide 608.0 (H) 0.0 - 100.0 pg/mL  D-dimer, quantitative (not at [facility])     Status: Abnormal   Collection Time: 04/27/17  4:44 AM  Result Value Ref Range   D-Dimer, Quant 9.17 (H) 0.00 - 0.50 ug/mL-FEU  Hepatic function panel     Status: None   Collection Time: 04/27/17  4:44 AM  Result Value Ref Range   Total Protein 7.6 6.5 - 8.1 g/dL   Albumin 4.3 3.5 - 5.0 g/dL   AST 28 15 - 41 U/L   ALT 22 17 - 63 U/L   Alkaline Phosphatase 71 38 - 126 U/L   Total Bilirubin 1.0 0.3 - 1.2 mg/dL   Bilirubin, Direct 0.1 0.1 - 0.5 mg/dL   Indirect Bilirubin 0.9 0.3 - 0.9 mg/dL  I-stat troponin, ED (not at [facility])     Status: None   Collection Time: 04/27/17  4:51 AM  Result Value Ref Range   Troponin i, poc 0.03 0.00 - 0.08 ng/mL   Comment 3          CBG monitoring, ED     Status: Abnormal   Collection Time: 04/27/17  4:57 AM  Result Value Ref Range   Glucose-Capillary 185 (H) 65 - 99 mg/dL  POCT i-Stat troponin I     Status: Abnormal   Collection Time: 04/27/17  1:23 PM  Result Value Ref Range   Troponin i, poc 28.77 (HH) 0.00 - 0.08 ng/mL   Comment 3           Ct Angio Chest Pe W Or Wo Contrast  Result Date: 04/27/2017 CLINICAL DATA:  83 year old male with 2 hour history of extreme shortness of breath. EXAM: CT ANGIOGRAPHY CHEST WITH CONTRAST TECHNIQUE: Multidetector CT imaging of the chest was performed using the standard protocol during bolus administration of  intravenous contrast. Multiplanar CT image reconstructions and MIPs were obtained to evaluate the vascular anatomy. CONTRAST:  100 mL of Isovue 370. COMPARISON:  No priors. FINDINGS: Cardiovascular: No filling defects in the pulmonary tail tree to suggest underlying pulmonary embolism. Heart size is mildly enlarged. There is no significant pericardial fluid, thickening or pericardial calcification. There is aortic atherosclerosis, as well as atherosclerosis of the great vessels of the mediastinum and the coronary arteries, including calcified atherosclerotic plaque in the left main, left anterior descending, left circumflex and right coronary arteries. Extensive thinning and dystrophic calcifications in the left  ventricle involving anterior, anteroseptal and septal wall segments throughout the mid ventricle and apex compatible with extensive remottling and scarring following LAD territory myocardial infarction(s). Calcifications of the aortic valve. Ectasia of ascending thoracic aorta (4.3 cm in diameter). Mediastinum/Nodes: No pathologically enlarged mediastinal or hilar lymph nodes. Esophagus is unremarkable in appearance. No axillary lymphadenopathy. Lungs/Pleura: Small bilateral pleural effusions lying dependently. Widespread ground-glass attenuation and interlobular septal thickening throughout the lungs bilaterally is most compatible with a background of interstitial pulmonary edema. No confluent consolidative airspace disease. No definite suspicious appearing pulmonary nodules or masses are noted. Upper Abdomen: Aortic atherosclerosis. 4.1 cm exophytic simple cyst in the upper pole of the left kidney. Colonic diverticulosis. Status post cholecystectomy. Musculoskeletal: There are no aggressive appearing lytic or blastic lesions noted in the visualized portions of the skeleton. Review of the MIP images confirms the above findings. IMPRESSION: 1. No evidence of pulmonary embolism. 2. There are findings  suggestive of congestive heart failure, as discussed above. 3. Aortic atherosclerosis, in addition to left main and 3 vessel coronary artery disease. In addition, there is evidence of prior distal LAD territory myocardial infarction(s) with extensive scarring in the left ventricle. 4. There are calcifications of the aortic valve. Echocardiographic correlation for evaluation of potential valvular dysfunction may be warranted if clinically indicated. 5. Ectasia of the ascending thoracic aorta (4.3 cm in diameter). Recommend annual imaging followup by CTA or MRA. This recommendation follows 2010 ACCF/AHA/AATS/ACR/ASA/SCA/SCAI/SIR/STS/SVM Guidelines for the Diagnosis and Management of Patients with Thoracic Aortic Disease. Circulation. 2010; 121: O130-Q657. 6. Colonic diverticulosis. Aortic Atherosclerosis (ICD10-I70.0). Electronically Signed   By: Trudie Reed M.D.   On: 04/27/2017 09:01   Dg Chest Port 1 View  Result Date: 04/27/2017 CLINICAL DATA:  Shortness of breath and chest pain EXAM: PORTABLE CHEST 1 VIEW COMPARISON:  None. FINDINGS: There are extensive bilateral interstitial opacities. Cardiomediastinal contours are normal. No pneumothorax or sizable pleural effusion. IMPRESSION: Extensive bilateral interstitial opacities which may indicate moderate interstitial pulmonary edema. In the absence of prior studies for comparison, interstitial lung disease would be difficult to exclude. Electronically Signed   By: Deatra Robinson M.D.   On: 04/27/2017 05:45     ASSESSMENT: Pt is an 83 yo with known CAD , CHF  Recent CHF exacerbation in OR  Pt with continued angina  He did not have this last year. Presents with severe back pain and SOB   Improved but not completely resolved  EKG is nondiagnostic but with frequent PVCs Echo difficult windows  Will need definity  Preliminary the LVEF is sverely depressed   Labs signif for trop of 28 (up from 0.03 yesterday)  1  CAD  I would continue heparin and resume  Plavix  Continue NTG I have discussed tests results with pt  He says he knew something was wrong.  Agrees to recomm for L heart cath with possible intervention    Will plan for Tx to [facility] with Tx to cardiology service Tentatively plan for procedure on Monday unless pain worsens, cannot be controlled  2  CHF  Volume is not bad  Follow I/O  3  AS  I have seen images  Does not appear severe  Will review when echo done   4  HTN  Follow  5   OSA  Will follow  Not sure if using CPAP  6 HL  Check lipids    Dietrich Pates

## 2017-04-27 NOTE — Progress Notes (Addendum)
Called Avis Epley,  RN who was this patients nurse in the emergency room in regards to an elevated troponin level of 28.77. Asked Arlys John who drew this lab and was this critical lab delivered to the physician.  This troponin was drawn in the ED via ISTAT Per Arlys John and he would message Dr. Darnelle Catalan to make aware of this critical lab.

## 2017-04-27 NOTE — Progress Notes (Signed)
Patient ID: Scott [name], male   DOB: January 26, 1927, 83 y.o.   MRN: [id number]    CARDIOLOGY HISTORY & PHYSICAL   Referring Physician: Dr. Darnelle Catalan Primary Physician: Dr. Maple Hudson In [place] Primary Cardiologist: Dr. Sol Blazing in [place] Reason for Admission: NSTEMI   HPI: Please see prior note for additional details. Briefly, Scott [name] is an 83 yo man with PMH CAD s/p MI and stents, obstructive sleep apnea on CPAP, HTN, hypothyroidism who is transferred from [facility] to [facility] for NSTEMI.  The patient reports a gradual increase in his back pain frequency, which is his anginal equivalent, over the last few months. Also using nitroglycerin more frequently. Notes decreased exercise tolerance as well. He was traveling to a family reunion when he suddenly became acutely short of breath in the middle of the night last night. He was seen in the [facility] ER and treated for pulmonary edema and hypoxic respiratory failure. He improved greatly with diuretics and BiPAP. He did continue to have mild back pain through much of the day, and this has recently resolved.  The patient's additional history is reviewed through [facility] and summarized per Dr. Roxy Cedar note 10/25/16: -CAD s/p RCA stent 1999, MI in 2014 -ICM, EF as low as 25%, improved with medical management (most recent EF 40%) -hypothyroidism -dyslipidemia -gout  He reports recently stopping clopidogrel (believes stent was placed in 2014 and was on clopidogrel >2 years). Had been on isosorbide at home with PRN SL NG. Believes he had excess fluid about a month ago but was doing well recently.  On my interview he is comfortable in bed. Denies chest or back pain. Breathing at baseline. No complaints.  Denies syncope, palpitations. Weights stable. Prior to episode last evening no SOB, PND, orthopnea, edema.  Denies bleeding issues, no prior stroke or ICH.  Review of Systems:     Cardiac Review of Systems: {Y] = yes [ ]  = no  Chest Pain [  N   ] Anginal equivalent Y Resting SOB [ Y yesterday  ] Exertional SOB  [ Y ]  Orthopnea Scott [name]  ]   Pedal Edema [ N  ]    Palpitations [ N ] Syncope  [ N ]   Presyncope [ N  ]  General Review of Systems: [Y] = yes [  ]=no Constitional: recent weight change [  ]; anorexia [  ]; fatigue [  ]; nausea [  ]; night sweats [  ]; fever [  ]; or chills [  ];                                                                     Eyes : blurred vision [  ]; diplopia [   ]; vision changes [  ];  Amaurosis fugax[  ]; Resp: cough [ Y ];  wheezing[  ];  hemoptysis[  ];  PND [  ];  GI:  gallstones[  ], vomiting[  ];  dysphagia[  ]; melena[  ];  hematochezia [  ]; heartburn[  ];   GU: kidney stones [  ]; hematuria[  ];   dysuria [  ];  nocturia[  ]; incontinence [  ];  Skin: rash, swelling[  ];, hair loss[  ];  peripheral edema[  ];  or itching[  ]; Musculosketetal: myalgias[  ];  joint swelling[  ];  joint erythema[  ];  joint pain[  ];  back pain[  ];  Heme/Lymph: bruising[  ];  bleeding[  ];  anemia[  ];  Neuro: TIA[  ];  headaches[  ];  stroke[  ];  vertigo[  ];  seizures[  ];   paresthesias[  ];  difficulty walking[  ];  Psych:depression[  ]; anxiety[  ];  Endocrine: diabetes[  ];  thyroid dysfunction[  ];  Other:  Past Medical History:  Diagnosis Date  . Angina pectoris (HCC)   . CHF (congestive heart failure) (HCC)   . Coronary artery disease   . Essential hypertension   . Gout   . History of MI (myocardial infarction)   . Hyperlipemia   . Hyperlipidemia   . Hypothyroidism   . Ischemic dilated cardiomyopathy (HCC)   . Multi-vessel coronary artery stenosis   . Varicose veins of both lower extremities     Medications Prior to Admission  Medication Sig Dispense Refill  . allopurinol (ZYLOPRIM) 100 MG tablet Take 100 mg by mouth daily.    Marland Kitchen aspirin 81 MG chewable tablet Chew 81 mg by mouth daily.    Marland Kitchen CALCIUM CARBONATE PO Take 600 mg by mouth 2 (two) times daily.    . carvedilol (COREG) 6.25  MG tablet Take 6.25 mg by mouth 2 (two) times daily with a meal.    . Cholecalciferol (VITAMIN D-1000 MAX ST) 1000 units tablet Take 2,000 Units by mouth every morning.    . cyanocobalamin (TH VITAMIN B12) 100 MCG tablet Take 1 tablet by mouth daily. Take 1 tablet by mouth in the evening.    . enalapril (VASOTEC) 5 MG tablet Take 5 mg by mouth at bedtime.    . fluticasone (CUTIVATE) 0.05 % cream Apply topically 2 (two) times daily.    . isosorbide mononitrate (IMDUR) 30 MG 24 hr tablet Take 30 mg by mouth daily.    Marland Kitchen levothyroxine (SYNTHROID, LEVOTHROID) 150 MCG tablet Take 150 mcg by mouth daily before breakfast.    . Lutein 20 MG CAPS Take 1 capsule by mouth every morning.    . Multiple Vitamin (MULTIVITAMIN) tablet Take 1 tablet by mouth daily.    . nitroGLYCERIN (NITROSTAT) 0.4 MG SL tablet Place 0.4 mg under the tongue every 5 (five) minutes as needed for chest pain.    . pravastatin (PRAVACHOL) 40 MG tablet Take 40 mg by mouth at bedtime.    Marland Kitchen Ubiquinol 100 MG CAPS Take 1 capsule by mouth every morning.       Marland Kitchen allopurinol  100 mg Oral Daily  . aspirin  81 mg Oral Daily  . carvedilol  6.25 mg Oral BID WC  . enalapril  5 mg Oral QHS  . enoxaparin (LOVENOX) injection  1 mg/kg Subcutaneous Q12H  . fluticasone   Topical BID  . furosemide  40 mg Intravenous BID  . [START ON 04/28/2017] levothyroxine  150 mcg Oral QAC breakfast  . multivitamin with minerals  1 tablet Oral Daily  . nitroGLYCERIN  0.5 inch Topical Q6H  . potassium chloride  20 mEq Oral BID  . pravastatin  40 mg Oral QHS  . sodium chloride flush  3 mL Intravenous Q12H    Infusions: . sodium chloride    . nitroGLYCERIN Stopped (04/27/17 0541)    No Known Allergies  Social History   Social History  . Marital status: Married    Spouse name: N/A  . Number of children: 3  . Years of education: N/A   Occupational History  . Not on file.   Social History Main Topics  . Smoking status: Never Smoker  . Smokeless  tobacco: Not on file  . Alcohol use Yes     Comment: Social  . Drug use: No  . Sexual activity: Not on file   Other Topics Concern  . Not on file   Social History Narrative   Remarried, with current wife 1 year. Has 3 children with his former wife. Visiting from [place].    Family History  Problem Relation Age of Onset  . Heart disease Mother        Pacemaker  . Bladder Cancer Father   . Bladder Cancer Brother   . Hypertension Son     PHYSICAL EXAM: Vitals:   04/27/17 2000 04/27/17 2138  BP:  (!) 146/61  Pulse:  70  Resp:  (!) 24  Temp: 98.1 F (36.7 C) 98.2 F (36.8 C)     Intake/Output Summary (Last 24 hours) at 04/27/17 2322 Last data filed at 04/27/17 2301  Gross per 24 hour  Intake                0 ml  Output             1875 ml  Net            -1875 ml    General:  Well appearing. No respiratory difficulty HEENT: normal Neck: supple. no JVD. Carotids 2+ bilat; no bruits. No lymphadenopathy or thryomegaly appreciated. Cor: PMI nondisplaced. Regular rate & rhythm. No rubs, gallops. II/VI systolic murmur sternal border. Lungs: clear Abdomen: soft, nontender, nondistended. No hepatosplenomegaly. No bruits or masses. Good bowel sounds. Extremities: no cyanosis, clubbing, rash, edema Neuro: alert & oriented x 3, cranial nerves grossly intact. moves all 4 extremities w/o difficulty. Affect pleasant.  ECG: sinus with PVCs  Prior echo from 08/2016 from care[facility]where: Conclusions  Left ventricular systolic function is mildly reduced. Left ventricular ejection fraction is 40% (+/- 5). There is mild aortic stenosis in the setting of mildly reduced LV function, underestimating the degree of stenosis. Mild aortic insufficiency is demonstrated. The ascending aorta is mildly dilated. Compared to the prior echo report on 02/03/2014, there is no significant change.  Study Comments The study was diagnostic quality. 2D transthoracic echocardiogram with color flow  and Doppler. The study was technically adequate with some images being suboptimal in quality. Compared to the prior echo report on 02/03/2014, there is no significant change. The patient was in normal sinus rhythm during the exam.  Left Ventricle The left ventricle is normal in size. There is mild concentric left ventricular hypertrophy. There is apical akinesis. Left ventricular systolic function is mildly reduced. The left ventricular wall motion is globally hypokinetic. Left ventricular ejection fraction is 40% (+/- 5). Spectral Doppler of the mitral valve shows a normal E/A wave ratio. The E/E' ratio between the mitral E wave and thei mitral annulus E' wave is 10. There is diastolic dysfunction of uncertain severity.  Right Ventricle The right ventricle is normal in size and function. The tricuspid annular plane systolic excursion is 2.2 cm. A measurement of 1.6 cm or greater is consistent with normal right ventricular systolic function.  Atria The left and right atria are normal in size. The left atrial volume indexed to BSA  is calculated to be 20cc/m2. A normal value range for LA volume is 16-34 ml/m2. There is no Doppler evidence for an interatrial shunt.   Mitral Valve Mild mitral regurgitation is present.  Aortic Valve The aortic valve is calcified. There is borderline mild aortic stenosis. Mild aortic insufficiency is demonstrated.  Tricuspid Valve The tricuspid valve is normal in structure and function. There is a trace or physiologic amount of tricuspid regurgitation. Right ventricular systolic pressure is normal.  Pulmonic Valve The pulmonic valve is normal in structure and function.  Great Vessels The aortic root is normal size. The aortic arch is normal in size and function. The ascending aorta is dilated. The IVC appears to collapse at least 50% or more and is normal in size which suggests normal central venous pressures.   Pericardium / Pleura There is no  pericardial effusion.  Results for orders placed or performed during the hospital encounter of 04/27/17 (from the past 24 hour(s))  Basic metabolic panel     Status: Abnormal   Collection Time: 04/27/17  4:44 AM  Result Value Ref Range   Sodium 141 135 - 145 mmol/L   Potassium 4.1 3.5 - 5.1 mmol/L   Chloride 105 101 - 111 mmol/L   CO2 22 22 - 32 mmol/L   Glucose, Bld 213 (H) 65 - 99 mg/dL   BUN 20 6 - 20 mg/dL   Creatinine, Ser 2.44 0.61 - 1.24 mg/dL   Calcium 8.9 8.9 - 01.0 mg/dL   GFR calc non Af Amer 55 (L) >60 mL/min   GFR calc Af Amer >60 >60 mL/min   Anion gap 14 5 - 15  CBC     Status: Abnormal   Collection Time: 04/27/17  4:44 AM  Result Value Ref Range   WBC 10.6 (H) 4.0 - 10.5 K/uL   RBC 5.38 4.22 - 5.81 MIL/uL   Hemoglobin 16.6 13.0 - 17.0 g/dL   HCT 27.2 53.6 - 64.4 %   MCV 93.5 78.0 - 100.0 fL   MCH 30.9 26.0 - 34.0 pg   MCHC 33.0 30.0 - 36.0 g/dL   RDW 03.4 74.2 - 59.5 %   Platelets 159 150 - 400 K/uL  Brain natriuretic peptide     Status: Abnormal   Collection Time: 04/27/17  4:44 AM  Result Value Ref Range   B Natriuretic Peptide 608.0 (H) 0.0 - 100.0 pg/mL  D-dimer, quantitative (not at [facility])     Status: Abnormal   Collection Time: 04/27/17  4:44 AM  Result Value Ref Range   D-Dimer, Quant 9.17 (H) 0.00 - 0.50 ug/mL-FEU  Hepatic function panel     Status: None   Collection Time: 04/27/17  4:44 AM  Result Value Ref Range   Total Protein 7.6 6.5 - 8.1 g/dL   Albumin 4.3 3.5 - 5.0 g/dL   AST 28 15 - 41 U/L   ALT 22 17 - 63 U/L   Alkaline Phosphatase 71 38 - 126 U/L   Total Bilirubin 1.0 0.3 - 1.2 mg/dL   Bilirubin, Direct 0.1 0.1 - 0.5 mg/dL   Indirect Bilirubin 0.9 0.3 - 0.9 mg/dL  I-stat troponin, ED (not at [facility])     Status: None   Collection Time: 04/27/17  4:51 AM  Result Value Ref Range   Troponin i, poc 0.03 0.00 - 0.08 ng/mL   Comment 3          CBG monitoring, ED     Status: Abnormal   Collection Time:  04/27/17  4:57 AM  Result Value Ref  Range   Glucose-Capillary 185 (H) 65 - 99 mg/dL  POCT i-Stat troponin I     Status: Abnormal   Collection Time: 04/27/17  1:23 PM  Result Value Ref Range   Troponin i, poc 28.77 (HH) 0.00 - 0.08 ng/mL   Comment 3          TSH     Status: None   Collection Time: 04/27/17  3:12 PM  Result Value Ref Range   TSH 0.713 0.350 - 4.500 uIU/mL  Troponin I     Status: Abnormal   Collection Time: 04/27/17  3:12 PM  Result Value Ref Range   Troponin I 37.52 (HH) <0.03 ng/mL   Ct Angio Chest Pe W Or Wo Contrast  Result Date: 04/27/2017 CLINICAL DATA:  83 year old male with 2 hour history of extreme shortness of breath. EXAM: CT ANGIOGRAPHY CHEST WITH CONTRAST TECHNIQUE: Multidetector CT imaging of the chest was performed using the standard protocol during bolus administration of intravenous contrast. Multiplanar CT image reconstructions and MIPs were obtained to evaluate the vascular anatomy. CONTRAST:  100 mL of Isovue 370. COMPARISON:  No priors. FINDINGS: Cardiovascular: No filling defects in the pulmonary tail tree to suggest underlying pulmonary embolism. Heart size is mildly enlarged. There is no significant pericardial fluid, thickening or pericardial calcification. There is aortic atherosclerosis, as well as atherosclerosis of the great vessels of the mediastinum and the coronary arteries, including calcified atherosclerotic plaque in the left main, left anterior descending, left circumflex and right coronary arteries. Extensive thinning and dystrophic calcifications in the left ventricle involving anterior, anteroseptal and septal wall segments throughout the mid ventricle and apex compatible with extensive remottling and scarring following LAD territory myocardial infarction(s). Calcifications of the aortic valve. Ectasia of ascending thoracic aorta (4.3 cm in diameter). Mediastinum/Nodes: No pathologically enlarged mediastinal or hilar lymph nodes. Esophagus is unremarkable in appearance. No axillary  lymphadenopathy. Lungs/Pleura: Small bilateral pleural effusions lying dependently. Widespread ground-glass attenuation and interlobular septal thickening throughout the lungs bilaterally is most compatible with a background of interstitial pulmonary edema. No confluent consolidative airspace disease. No definite suspicious appearing pulmonary nodules or masses are noted. Upper Abdomen: Aortic atherosclerosis. 4.1 cm exophytic simple cyst in the upper pole of the left kidney. Colonic diverticulosis. Status post cholecystectomy. Musculoskeletal: There are no aggressive appearing lytic or blastic lesions noted in the visualized portions of the skeleton. Review of the MIP images confirms the above findings. IMPRESSION: 1. No evidence of pulmonary embolism. 2. There are findings suggestive of congestive heart failure, as discussed above. 3. Aortic atherosclerosis, in addition to left main and 3 vessel coronary artery disease. In addition, there is evidence of prior distal LAD territory myocardial infarction(s) with extensive scarring in the left ventricle. 4. There are calcifications of the aortic valve. Echocardiographic correlation for evaluation of potential valvular dysfunction may be warranted if clinically indicated. 5. Ectasia of the ascending thoracic aorta (4.3 cm in diameter). Recommend annual imaging followup by CTA or MRA. This recommendation follows 2010 ACCF/AHA/AATS/ACR/ASA/SCA/SCAI/SIR/STS/SVM Guidelines for the Diagnosis and Management of Patients with Thoracic Aortic Disease. Circulation. 2010; 121: W098-J191. 6. Colonic diverticulosis. Aortic Atherosclerosis (ICD10-I70.0). Electronically Signed   By: Trudie Reed M.D.   On: 04/27/2017 09:01   Dg Chest Port 1 View  Result Date: 04/27/2017 CLINICAL DATA:  Shortness of breath and chest pain EXAM: PORTABLE CHEST 1 VIEW COMPARISON:  None. FINDINGS: There are extensive bilateral interstitial opacities. Cardiomediastinal contours are normal. No  pneumothorax or sizable pleural effusion. IMPRESSION: Extensive bilateral interstitial opacities which may indicate moderate interstitial pulmonary edema. In the absence of prior studies for comparison, interstitial lung disease would be difficult to exclude. Electronically Signed   By: Deatra Robinson M.D.   On: 04/27/2017 05:45   ASSESSMENT/PLAN: Scott [name] is an 83 yo man with CAD s/p stents, ICM with last EF 40% who presented with respiratory distress (now resolved) and was found to have NSTEMI with second troponin of 37. Had back pain for much of day, which is his anginal equivalent, now resolved.  NSTEMI -received lovenox dosing, last this evening. Would start heparin drip once therapeutic lovenox window is past -changed pravastatin to atorvastatin -Dr. Tenny Craw recommended clopidogrel, will restart this -plan for cath Monday -heart healthy diet -continue carvedilol and enalapril -SL NG prn for his anginal equivalent--back pain -continue to monitor troponins to peak  OSA -ordered CPAP  HTN -controlled, carvedilol/enalapril as above  HLD -changed to high intensity statin  AS  -not severe on prior echo or by exam, echo results here pending  ICM, heart failure -euvolemic currently, dose lasix and potassium PRN -carvedilol and enalapril as above  Hypothyroidism -continue levothyroxine  FULL CODE, confirmed with patient  Jodelle Red, MD, PhD, cardiology overnight provide

## 2017-04-27 NOTE — ED Notes (Signed)
Pt assisted to bedside commode. Pt voided on the floor unable to obtain output at this time.

## 2017-04-27 NOTE — Progress Notes (Signed)
  Echocardiogram 2D Echocardiogram has been performed.  Draken Farrior T Lashun Ramseyer 04/27/2017, 6:12 PM

## 2017-04-27 NOTE — Progress Notes (Signed)
CRITICAL VALUE ALERT  Critical Value:  Troponin 37.52  Date & Time Notied:  04/27/17 1615  Provider Notified: Dr.Rama  Orders Received/Actions taken: awaiting callback

## 2017-04-27 NOTE — ED Notes (Addendum)
Date and time results received: 04/27/17 2:39 PM  (use smartphrase ".now" to insert current time)  Test: Istat Troponin  Critical Value: 28.77  Name of Provider Notified: Rama  Orders Received? Or Actions Taken?: Orders Received - See Orders for details

## 2017-04-28 ENCOUNTER — Inpatient Hospital Stay (HOSPITAL_COMMUNITY): Payer: Medicare (Managed Care)

## 2017-04-28 ENCOUNTER — Encounter (HOSPITAL_COMMUNITY): Payer: Self-pay

## 2017-04-28 DIAGNOSIS — I351 Nonrheumatic aortic (valve) insufficiency: Secondary | ICD-10-CM

## 2017-04-28 LAB — TROPONIN I
TROPONIN I: 16.19 ng/mL — AB (ref <0.03)
Troponin I: 16.39 ng/mL (ref <0.03)

## 2017-04-28 LAB — HEMOGLOBIN A1C
HEMOGLOBIN A1C: 5.5 % (ref 4.8–5.6)
MEAN PLASMA GLUCOSE: 111 mg/dL

## 2017-04-28 LAB — ECHOCARDIOGRAM LIMITED
HEIGHTINCHES: 70.5 in
Weight: 3005.31 oz

## 2017-04-28 LAB — HEPARIN LEVEL (UNFRACTIONATED): Heparin Unfractionated: 0.37 IU/mL (ref 0.30–0.70)

## 2017-04-28 LAB — MRSA PCR SCREENING: MRSA by PCR: NEGATIVE

## 2017-04-28 MED ORDER — FUROSEMIDE 10 MG/ML IJ SOLN
40.0000 mg | Freq: Once | INTRAMUSCULAR | Status: AC
  Administered 2017-04-28: 40 mg via INTRAVENOUS
  Filled 2017-04-28: qty 4

## 2017-04-28 MED ORDER — PERFLUTREN LIPID MICROSPHERE
1.0000 mL | INTRAVENOUS | Status: AC | PRN
  Administered 2017-04-28: 2 mL via INTRAVENOUS
  Filled 2017-04-28: qty 10

## 2017-04-28 MED ORDER — CLOPIDOGREL BISULFATE 75 MG PO TABS
75.0000 mg | ORAL_TABLET | Freq: Every day | ORAL | Status: DC
  Administered 2017-04-28 – 2017-05-01 (×4): 75 mg via ORAL
  Filled 2017-04-28 (×4): qty 1

## 2017-04-28 MED ORDER — CLOPIDOGREL BISULFATE 75 MG PO TABS
300.0000 mg | ORAL_TABLET | Freq: Once | ORAL | Status: AC
  Administered 2017-04-28: 300 mg via ORAL
  Filled 2017-04-28: qty 4

## 2017-04-28 NOTE — Progress Notes (Signed)
Called into room by patient's wife to assess urine. Patient had voided into urinal; urine was clear and yellow but very slightly blood tinged. There was a small amount of blood noted on wash cloth and a few drops of blood oozing from urethra. Notified on call MD; no new orders received at this time. Will continue to monitor closely.

## 2017-04-28 NOTE — Progress Notes (Signed)
*  PRELIMINARY RESULTS* Echocardiogram 2D Echocardiogram has been performed.  Scott [name] 04/28/2017, 11:51 AM

## 2017-04-28 NOTE — Progress Notes (Signed)
ANTICOAGULATION CONSULT NOTE - Follow Up Consult  Pharmacy Consult for Heparin Indication: NSTEMI  No Known Allergies  Patient Measurements: Height: 5' 10.5" (179.1 cm) Weight: 187 lb 13.3 oz (85.2 kg) IBW/kg (Calculated) : 74.15 Heparin Dosing Weight:    Vital Signs: Temp: 98.5 F (36.9 C) (07/21 1257) Temp Source: Oral (07/21 1257) BP: 120/64 (07/21 1156) Pulse Rate: 69 (07/21 1156)  Labs:  Recent Labs  04/27/17 0444 04/27/17 1512 04/28/17 1151 04/28/17 1641  HGB 16.6  --   --   --   HCT 50.3  --   --   --   PLT 159  --   --   --   HEPARINUNFRC  --   --   --  0.37  CREATININE 1.15  --   --   --   TROPONINI  --  37.52* 16.19*  --     Estimated Creatinine Clearance: 47.5 mL/min (by C-G formula based on SCr of 1.15 mg/dL).   Assessment:  Anticoag: Lovenox>>Heparin, PE has been ruled out with CT Angio Continue heparin for nstemi. HL 0.37 in goal range.   Goal of Therapy:  Heparin level 0.3-0.7 units/ml Monitor platelets by anticoagulation protocol: Yes   Plan:  Continue IV heparin at 1000 units/hr Daily HL and CBC   Takeysha Bonk S. Merilynn Finland, PharmD, BCPS Clinical Staff Pharmacist Pager [phone]  Misty Stanley Stillinger 04/28/2017,5:30 PM

## 2017-04-28 NOTE — Progress Notes (Signed)
Subjective:  He complains of a mild amount of back pain when he lays on his back.  He has no chest pain but evidently has not had chest pain previously.  Troponin was elevated yesterday but no repeat troponin has been noted.  Objective:  Vital Signs in the last 24 hours: BP 135/67   Pulse 66   Temp 98.2 F (36.8 C) (Oral)   Resp (!) 24   Ht 5' 10.5" (1.791 m)   Wt 85.2 kg (187 lb 13.3 oz)   SpO2 97%   BMI 26.57 kg/m   Physical Exam:  Lungs:  Clear Cardiac:  Regular rhythm, normal S1 and S2, no S3, 2/6 systolic murmur in aortic area Abdomen:  Soft, nontender, no masses Extremities:  No edema present  Intake/Output from previous day: 07/20 0701 - 07/21 0700 In: 240 [P.O.:240] Out: 2300 [Urine:2300]  Weight Filed Weights   04/27/17 1421 04/27/17 2138 04/28/17 0557  Weight: 88.5 kg (195 lb 1.7 oz) 85.4 kg (188 lb 4.8 oz) 85.2 kg (187 lb 13.3 oz)    Lab Results: Basic Metabolic Panel:  Recent Labs  05/23/47 0444  NA 141  K 4.1  CL 105  CO2 22  GLUCOSE 213*  BUN 20  CREATININE 1.15   CBC:  Recent Labs  04/27/17 0444  WBC 10.6*  HGB 16.6  HCT 50.3  MCV 93.5  PLT 159   Cardiac Enzymes: Troponin (Point of Care Test)  Recent Labs  04/27/17 1323  TROPIPOC 28.77*   Cardiac Panel (last 3 results)  Recent Labs  04/27/17 1512  TROPONINI 37.52*    Telemetry: Personally reviewed.  Sinus rhythm.  EKG Personally reviewed.  Lateral ST depression, occasional PVCs sinus rhythm.  Right bundle branch block, old anteroseptal infarction..  Assessment/Plan:  1.  Non-STEMI 2.  Chronic systolic heart failure 3.  Aortic stenosis 4.  Right bundle branch block  Recommendations:  We'll get serial troponins.  He is on heparin and complains of very minimal back pain.  Await echocardiogram to assess aortic stenosis today.      Darden Palmer  MD [facility]  04/28/2017, 11:02 AM

## 2017-04-28 NOTE — Progress Notes (Signed)
PROGRESS NOTE    Scott [name]  KZS:[id number] DOB: 03-09-1927 DOA: 04/27/2017 PCP: System, Pcp Not In     Brief Narrative:    83 y.o. male with a PMH of congestive heart failure, coronary artery disease status post MI and stents 2, obstructive sleep apnea on CPAP for the past month, history of angina, history of controlled hypertension and hypothyroidism who presents to the ED with a chief complaint of the sudden onset of extreme dyspnea .  Assessment & Plan:   1-non-ST elevation MI : Atypical presentation , EKG with a new ischemic changes and frequent PVC, troponin has peaked to 37, is currently chest pain-free his back pain is better, we'll continue medical management with heparin drip, asa/ Plavix/Coreg/statin/vasotec and NTG PRN , Plan for [facility] on Monday per cards . 2-history of ischemic cardiomyopathy  status post 2 stents in the past: He has a low EF, pending 2D cho reading to evaluate LVEF and AS , currently euvolemic . 3-Hx of HTN : Better controlled, continue beta blocker/ Vasotec. 4-hyperlipidemia continue statin 5-history of hypothyroidism continue synthroid.  DVT prophylaxis: (/anticoagulated Code Status: (Full) Family Communication: (wife) Disposition Plan: (home)   Consultants:   Cardiology  Procedures: none.  Antimicrobials: (specify start and planned stop date. Auto populated tables are space occupying and do not give end dates)  None       Subjective:  Reports improvement in his shortness of breath and back pain comparing to yesterday, denies any chest pain or fevers or chills or nausea or vomiting or abdominal pain.  Objective: Vitals:   04/27/17 2349 04/28/17 0557 04/28/17 0558 04/28/17 0843  BP:  129/68  135/67  Pulse: 64 63  66  Resp: 19 19  (!) 24  Temp:  (!) 85.2 F (29.6 C) 98.2 F (36.8 C)   TempSrc:  Oral Oral   SpO2: 93% 98%  97%  Weight:  85.2 kg (187 lb 13.3 oz)    Height:        Intake/Output Summary (Last 24 hours) at  04/28/17 0942 Last data filed at 04/28/17 0559  Gross per 24 hour  Intake              240 ml  Output             2300 ml  Net            -2060 ml   Filed Weights   04/27/17 1421 04/27/17 2138 04/28/17 0557  Weight: 88.5 kg (195 lb 1.7 oz) 85.4 kg (188 lb 4.8 oz) 85.2 kg (187 lb 13.3 oz)    Examination:  General exam: NAD. Respiratory system: CTAB. Cardiovascular system: S1 & S2 heard, RRR , sys murmer in the aortic area. Gastrointestinal system: Abdomen is nondistended, soft and nontender. No organomegaly or masses felt. Normal bowel sounds heard. Central nervous system: Alert and oriented. No focal neurological deficits. Extremities: Symmetric 5 x 5 power. Skin: No rashes, lesions or ulcers Psychiatry: Judgement and insight appear normal. Mood & affect appropriate.     Data Reviewed: I have personally reviewed following labs and imaging studies  CBC:  Recent Labs Lab 04/27/17 0444  WBC 10.6*  HGB 16.6  HCT 50.3  MCV 93.5  PLT 159   Basic Metabolic Panel:  Recent Labs Lab 04/27/17 0444  NA 141  K 4.1  CL 105  CO2 22  GLUCOSE 213*  BUN 20  CREATININE 1.15  CALCIUM 8.9   GFR: Estimated Creatinine Clearance: 47.5 mL/min (by C-G  formula based on SCr of 1.15 mg/dL). Liver Function Tests:  Recent Labs Lab 04/27/17 0444  AST 28  ALT 22  ALKPHOS 71  BILITOT 1.0  PROT 7.6  ALBUMIN 4.3   No results for input(s): LIPASE, AMYLASE in the last 168 hours. No results for input(s): AMMONIA in the last 168 hours. Coagulation Profile: No results for input(s): INR, PROTIME in the last 168 hours. Cardiac Enzymes:  Recent Labs Lab 04/27/17 1512  TROPONINI 37.52*   BNP (last 3 results) No results for input(s): PROBNP in the last 8760 hours. HbA1C:  Recent Labs  04/27/17 0444  HGBA1C 5.5   CBG:  Recent Labs Lab 04/27/17 0457  GLUCAP 185*   Lipid Profile: No results for input(s): CHOL, HDL, LDLCALC, TRIG, CHOLHDL, LDLDIRECT in the last 72  hours. Thyroid Function Tests:  Recent Labs  04/27/17 1512  TSH 0.713   Anemia Panel: No results for input(s): VITAMINB12, FOLATE, FERRITIN, TIBC, IRON, RETICCTPCT in the last 72 hours. Urine analysis: No results found for: COLORURINE, APPEARANCEUR, LABSPEC, PHURINE, GLUCOSEU, HGBUR, BILIRUBINUR, KETONESUR, PROTEINUR, UROBILINOGEN, NITRITE, LEUKOCYTESUR Sepsis Labs: @LABRCNTIP (procalcitonin:4,lacticidven:4)  ) Recent Results (from the past 240 hour(s))  MRSA PCR Screening     Status: None   Collection Time: 04/27/17  3:00 PM  Result Value Ref Range Status   MRSA by PCR NEGATIVE NEGATIVE Final    Comment:        The GeneXpert MRSA Assay (FDA approved for NASAL specimens only), is one component of a comprehensive MRSA colonization surveillance program. It is not intended to diagnose MRSA infection nor to guide or monitor treatment for MRSA infections.          Radiology Studies: Ct Angio Chest Pe W Or Wo Contrast  Result Date: 04/27/2017 CLINICAL DATA:  83 year old male with 2 hour history of extreme shortness of breath. EXAM: CT ANGIOGRAPHY CHEST WITH CONTRAST TECHNIQUE: Multidetector CT imaging of the chest was performed using the standard protocol during bolus administration of intravenous contrast. Multiplanar CT image reconstructions and MIPs were obtained to evaluate the vascular anatomy. CONTRAST:  100 mL of Isovue 370. COMPARISON:  No priors. FINDINGS: Cardiovascular: No filling defects in the pulmonary tail tree to suggest underlying pulmonary embolism. Heart size is mildly enlarged. There is no significant pericardial fluid, thickening or pericardial calcification. There is aortic atherosclerosis, as well as atherosclerosis of the great vessels of the mediastinum and the coronary arteries, including calcified atherosclerotic plaque in the left main, left anterior descending, left circumflex and right coronary arteries. Extensive thinning and dystrophic calcifications  in the left ventricle involving anterior, anteroseptal and septal wall segments throughout the mid ventricle and apex compatible with extensive remottling and scarring following LAD territory myocardial infarction(s). Calcifications of the aortic valve. Ectasia of ascending thoracic aorta (4.3 cm in diameter). Mediastinum/Nodes: No pathologically enlarged mediastinal or hilar lymph nodes. Esophagus is unremarkable in appearance. No axillary lymphadenopathy. Lungs/Pleura: Small bilateral pleural effusions lying dependently. Widespread ground-glass attenuation and interlobular septal thickening throughout the lungs bilaterally is most compatible with a background of interstitial pulmonary edema. No confluent consolidative airspace disease. No definite suspicious appearing pulmonary nodules or masses are noted. Upper Abdomen: Aortic atherosclerosis. 4.1 cm exophytic simple cyst in the upper pole of the left kidney. Colonic diverticulosis. Status post cholecystectomy. Musculoskeletal: There are no aggressive appearing lytic or blastic lesions noted in the visualized portions of the skeleton. Review of the MIP images confirms the above findings. IMPRESSION: 1. No evidence of pulmonary embolism. 2. There are findings  suggestive of congestive heart failure, as discussed above. 3. Aortic atherosclerosis, in addition to left main and 3 vessel coronary artery disease. In addition, there is evidence of prior distal LAD territory myocardial infarction(s) with extensive scarring in the left ventricle. 4. There are calcifications of the aortic valve. Echocardiographic correlation for evaluation of potential valvular dysfunction may be warranted if clinically indicated. 5. Ectasia of the ascending thoracic aorta (4.3 cm in diameter). Recommend annual imaging followup by CTA or MRA. This recommendation follows 2010 ACCF/AHA/AATS/ACR/ASA/SCA/SCAI/SIR/STS/SVM Guidelines for the Diagnosis and Management of Patients with Thoracic  Aortic Disease. Circulation. 2010; 121: Z610-R604. 6. Colonic diverticulosis. Aortic Atherosclerosis (ICD10-I70.0). Electronically Signed   By: Trudie Reed M.D.   On: 04/27/2017 09:01   Dg Chest Port 1 View  Result Date: 04/27/2017 CLINICAL DATA:  Shortness of breath and chest pain EXAM: PORTABLE CHEST 1 VIEW COMPARISON:  None. FINDINGS: There are extensive bilateral interstitial opacities. Cardiomediastinal contours are normal. No pneumothorax or sizable pleural effusion. IMPRESSION: Extensive bilateral interstitial opacities which may indicate moderate interstitial pulmonary edema. In the absence of prior studies for comparison, interstitial lung disease would be difficult to exclude. Electronically Signed   By: Deatra Robinson M.D.   On: 04/27/2017 05:45        Scheduled Meds: . allopurinol  100 mg Oral Daily  . aspirin EC  81 mg Oral Daily  . atorvastatin  80 mg Oral q1800  . carvedilol  6.25 mg Oral BID WC  . clopidogrel  75 mg Oral Daily  . enalapril  5 mg Oral QHS  . fluticasone   Topical BID  . levothyroxine  150 mcg Oral QAC breakfast  . multivitamin with minerals  1 tablet Oral Daily  . sodium chloride flush  3 mL Intravenous Q12H   Continuous Infusions: . sodium chloride    . heparin 1,000 Units/hr (04/28/17 0837)     LOS: 1 day    Time spent: 40 minutes .    Efrain Sella, MD [facility] Pager [phone]  If 7PM-7AM, please contact night-coverage [email or web address] Password [facility] 04/28/2017, 9:42 AM

## 2017-04-29 LAB — CBC
HCT: 47.3 % (ref 39.0–52.0)
Hemoglobin: 15.9 g/dL (ref 13.0–17.0)
MCH: 30.5 pg (ref 26.0–34.0)
MCHC: 33.6 g/dL (ref 30.0–36.0)
MCV: 90.6 fL (ref 78.0–100.0)
PLATELETS: 152 10*3/uL (ref 150–400)
RBC: 5.22 MIL/uL (ref 4.22–5.81)
RDW: 13.6 % (ref 11.5–15.5)
WBC: 7.4 10*3/uL (ref 4.0–10.5)

## 2017-04-29 LAB — BASIC METABOLIC PANEL
Anion gap: 13 (ref 5–15)
BUN: 18 mg/dL (ref 6–20)
CALCIUM: 9 mg/dL (ref 8.9–10.3)
CO2: 27 mmol/L (ref 22–32)
CREATININE: 1.07 mg/dL (ref 0.61–1.24)
Chloride: 100 mmol/L — ABNORMAL LOW (ref 101–111)
GFR calc Af Amer: >60 mL/min (ref >60)
GFR calc non Af Amer: >60 mL/min (ref >60)
GLUCOSE: 88 mg/dL (ref 65–99)
Potassium: 3.5 mmol/L (ref 3.5–5.1)
Sodium: 140 mmol/L (ref 135–145)

## 2017-04-29 LAB — PROTIME-INR
INR: 0.87
PROTHROMBIN TIME: 11.8 s (ref 11.4–15.2)

## 2017-04-29 LAB — HEPARIN LEVEL (UNFRACTIONATED)
HEPARIN UNFRACTIONATED: 0.24 [IU]/mL — AB (ref 0.30–0.70)
HEPARIN UNFRACTIONATED: 0.31 [IU]/mL (ref 0.30–0.70)

## 2017-04-29 LAB — TROPONIN I: Troponin I: 10.93 ng/mL (ref <0.03)

## 2017-04-29 MED ORDER — ASPIRIN 81 MG PO CHEW
81.0000 mg | CHEWABLE_TABLET | ORAL | Status: AC
  Administered 2017-04-30: 81 mg via ORAL
  Filled 2017-04-29: qty 1

## 2017-04-29 NOTE — Progress Notes (Signed)
Subjective:  Still with some back pain but no chest pain.  No shortness of breath.  Troponins have come down.  Objective:  Vital Signs in the last 24 hours: BP 122/77 (BP Location: Right Arm)   Pulse 66   Temp 98 F (36.7 C) (Oral)   Resp 18   Ht 5' 10.5" (1.791 m)   Wt 84.6 kg (186 lb 6.4 oz)   SpO2 94%   BMI 26.37 kg/m   Physical Exam:  Lungs:  Clear Cardiac:  Regular rhythm, normal S1 and S2, no S3, 2/6 systolic murmur in aortic area Abdomen:  Soft, nontender, no masses Extremities:  No edema present  Intake/Output from previous day: 07/21 0701 - 07/22 0700 In: 662.4 [P.O.:480; I.V.:182.4] Out: 1000 [Urine:1000]  Weight Filed Weights   04/27/17 2138 04/28/17 0557 04/29/17 0447  Weight: 85.4 kg (188 lb 4.8 oz) 85.2 kg (187 lb 13.3 oz) 84.6 kg (186 lb 6.4 oz)    Lab Results: Basic Metabolic Panel:  Recent Labs  71/69/67 0444 04/29/17 0500  NA 141 140  K 4.1 3.5  CL 105 100*  CO2 22 27  GLUCOSE 213* 88  BUN 20 18  CREATININE 1.15 1.07   CBC:  Recent Labs  04/27/17 0444  WBC 10.6*  HGB 16.6  HCT 50.3  MCV 93.5  PLT 159   Cardiac Enzymes: Troponin (Point of Care Test)  Recent Labs  04/27/17 1323  TROPIPOC 28.77*   Cardiac Panel (last 3 results)  Recent Labs  04/28/17 1151 04/28/17 1641 04/28/17 2317  TROPONINI 16.19* 16.39* 10.93*    Telemetry: Personally reviewed.  Sinus rhythm.  EKG Personally reviewed.  Lateral ST depression, occasional PVCs sinus rhythm.  Right bundle branch block, old anteroseptal infarction..  Assessment/Plan:  1.  Non-STEMI 2.  Chronic systolic heart failure-EF around 30-35% with a previous anteroapical infarction, aortic stenosis appears only mild to moderate.  3.  Aortic stenosis 4.  Right bundle branch block  Recommendations:  Looks fairly stable.  His wife asks whether he should delay his airline flight back to [place] where he lives. Cardiac catheterization was discussed with the patient fully  including risks of myocardial infarction, death, stroke, bleeding, arrhythmia, dye allergy, renal insufficiency or bleeding.  The patient understands and is willing to proceed.  Possibility of intervention at the same time also discussed with patient and wife and they understand and are agreeable to proceed   W. Ashley Royalty  MD [facility]  04/29/2017, 10:20 AM

## 2017-04-29 NOTE — Progress Notes (Signed)
ANTICOAGULATION CONSULT NOTE - Follow Up Consult  Pharmacy Consult for Heparin Indication: NSTEMI  No Known Allergies  Patient Measurements: Height: 5' 10.5" (179.1 cm) Weight: 186 lb 6.4 oz (84.6 kg) IBW/kg (Calculated) : 74.15 Heparin Dosing Weight:  85 kg  Vital Signs: Temp: 98 F (36.7 C) (07/22 0800) Temp Source: Oral (07/22 0800) BP: 122/77 (07/22 0800) Pulse Rate: 66 (07/22 0800)  Labs:  Recent Labs  04/27/17 0444  04/28/17 1151 04/28/17 1641 04/28/17 2317 04/29/17 0500 04/29/17 1329  HGB 16.6  --   --   --   --   --  15.9  HCT 50.3  --   --   --   --   --  47.3  PLT 159  --   --   --   --   --  152  HEPARINUNFRC  --   --   --  0.37  --   --  0.24*  CREATININE 1.15  --   --   --   --  1.07  --   TROPONINI  --   < > 16.19* 16.39* 10.93*  --   --   < > = values in this interval not displayed.  Estimated Creatinine Clearance: 51 mL/min (by C-G formula based on SCr of 1.07 mg/dL).   Assessment: 4 yoM atypically presenting with NSTEMI.  PE has been ruled out with CT Angio. Morning heparin level not ordered, a heparin level and CBC was ordered today at 1400.  1400 Heparin level subtherapeutic: 0.24 CBC stable and no recent bleeding Of note, patient had hematuria on 7/21, verified with nurse today that there were no recent bleeding episodes   Goal of Therapy:  Heparin level 0.3-0.7 units/ml Monitor platelets by anticoagulation protocol: Yes   Plan:  Increase heparin rate to 1,150 units/hr Heparin level at 2300 Daily HL and CBC    Hershal Coria, PharmD, MS PGY1 Pharmacy Resident 04/29/2017,2:43 PM

## 2017-04-29 NOTE — Progress Notes (Signed)
PROGRESS NOTE    Scott [name]  ZOX:[id number] DOB: [birth year] DOA: 04/27/2017 PCP: System, Pcp Not In     Brief Narrative:    83 y.o. male with a PMH of congestive heart failure, coronary artery disease status post MI and stents 2, obstructive sleep apnea on CPAP for the past month, history of angina, history of controlled hypertension and hypothyroidism who presents to the ED with a chief complaint of the sudden onset of extreme dyspnea .  Assessment & Plan:   1-Non-ST elevation MI : Atypical presentation , EKG with a new ischemic changes and frequent PVC, troponin has peaked to 37 and is trending down now , is currently chest pain-free , SOB is better ,  continue medical management with heparin drip, asa/ Plavix/Coreg/statin/vasotec and NTG PRN , Plan for [facility] on Monday per cards . 2-history of ischemic cardiomyopathy  status post 2 stents in the past: He has a low EF, pending 2D cho reading to evaluate LVEF and AS , with such trop leak his EF would be compromised mostly , he is currently euvolemic clinically with clear lungs  after I gave him one dose of Lasix with good UO . 3-Hx of HTN : Better controlled, continue beta blocker/ Vasotec. 4-hyperlipidemia continue statin 5-history of hypothyroidism continue synthroid. 6-Hx of Gout : stable , continue Allopurinol . 7-Mild gross hematuria : in the setting of AC , mild will monitor for now .  DVT prophylaxis: (anticoagulated) Code Status: (Full) Family Communication: (wife) Disposition Plan: (home)   Consultants:   Cardiology  Procedures: none.  Antimicrobials: (specify start and planned stop date. Auto populated tables are space occupying and do not give end dates)  None       Subjective:  SOB has resolved , on and off position related back discomfort , mild gross hematuria , no CP no fevers or chills , no cough .  Objective: Vitals:   04/28/17 1958 04/28/17 2118 04/29/17 0447 04/29/17 0800  BP:  125/66 130/90  122/77  Pulse:   68 66  Resp:   (!) 21 18  Temp: 97.6 F (36.4 C)  97.8 F (36.6 C) 98 F (36.7 C)  TempSrc: Oral  Oral Oral  SpO2:   96% 94%  Weight:   84.6 kg (186 lb 6.4 oz)   Height:        Intake/Output Summary (Last 24 hours) at 04/29/17 0904 Last data filed at 04/29/17 0648  Gross per 24 hour  Intake           662.35 ml  Output              850 ml  Net          -187.65 ml   Filed Weights   04/27/17 2138 04/28/17 0557 04/29/17 0447  Weight: 85.4 kg (188 lb 4.8 oz) 85.2 kg (187 lb 13.3 oz) 84.6 kg (186 lb 6.4 oz)    Examination:  General exam: NAD. Respiratory system: clear to aus B/L . Cardiovascular system: S1 & S2 heard, RRR , sys murmer in the aortic area. Gastrointestinal system: Abdomen is nondistended, soft and nontender. No organomegaly or masses felt. Normal bowel sounds heard. Central nervous system: Alert and oriented. No focal neurological deficits. Extremities: Symmetric 5 x 5 power. Skin: No rashes, lesions or ulcers Psychiatry: Judgement and insight appear normal. Mood & affect appropriate.     Data Reviewed: I have personally reviewed following labs and imaging studies  CBC:  Recent Labs Lab 04/27/17 0444  WBC 10.6*  HGB 16.6  HCT 50.3  MCV 93.5  PLT 159   Basic Metabolic Panel:  Recent Labs Lab 04/27/17 0444 04/29/17 0500  NA 141 140  K 4.1 3.5  CL 105 100*  CO2 22 27  GLUCOSE 213* 88  BUN 20 18  CREATININE 1.15 1.07  CALCIUM 8.9 9.0   GFR: Estimated Creatinine Clearance: 51 mL/min (by C-G formula based on SCr of 1.07 mg/dL). Liver Function Tests:  Recent Labs Lab 04/27/17 0444  AST 28  ALT 22  ALKPHOS 71  BILITOT 1.0  PROT 7.6  ALBUMIN 4.3   No results for input(s): LIPASE, AMYLASE in the last 168 hours. No results for input(s): AMMONIA in the last 168 hours. Coagulation Profile: No results for input(s): INR, PROTIME in the last 168 hours. Cardiac Enzymes:  Recent Labs Lab 04/27/17 1512 04/28/17 1151  04/28/17 1641 04/28/17 2317  TROPONINI 37.52* 16.19* 16.39* 10.93*   BNP (last 3 results) No results for input(s): PROBNP in the last 8760 hours. HbA1C:  Recent Labs  04/27/17 0444  HGBA1C 5.5   CBG:  Recent Labs Lab 04/27/17 0457  GLUCAP 185*   Lipid Profile: No results for input(s): CHOL, HDL, LDLCALC, TRIG, CHOLHDL, LDLDIRECT in the last 72 hours. Thyroid Function Tests:  Recent Labs  04/27/17 1512  TSH 0.713   Anemia Panel: No results for input(s): VITAMINB12, FOLATE, FERRITIN, TIBC, IRON, RETICCTPCT in the last 72 hours. Urine analysis: No results found for: COLORURINE, APPEARANCEUR, LABSPEC, PHURINE, GLUCOSEU, HGBUR, BILIRUBINUR, KETONESUR, PROTEINUR, UROBILINOGEN, NITRITE, LEUKOCYTESUR Sepsis Labs: @LABRCNTIP (procalcitonin:4,lacticidven:4)  ) Recent Results (from the past 240 hour(s))  MRSA PCR Screening     Status: None   Collection Time: 04/27/17  3:00 PM  Result Value Ref Range Status   MRSA by PCR NEGATIVE NEGATIVE Final    Comment:        The GeneXpert MRSA Assay (FDA approved for NASAL specimens only), is one component of a comprehensive MRSA colonization surveillance program. It is not intended to diagnose MRSA infection nor to guide or monitor treatment for MRSA infections.          Radiology Studies: No results found.      Scheduled Meds: . allopurinol  100 mg Oral Daily  . aspirin EC  81 mg Oral Daily  . atorvastatin  80 mg Oral q1800  . carvedilol  6.25 mg Oral BID WC  . clopidogrel  75 mg Oral Daily  . enalapril  5 mg Oral QHS  . fluticasone   Topical BID  . levothyroxine  150 mcg Oral QAC breakfast  . multivitamin with minerals  1 tablet Oral Daily  . sodium chloride flush  3 mL Intravenous Q12H   Continuous Infusions: . sodium chloride    . heparin 1,000 Units/hr (04/29/17 0840)     LOS: 2 days    Time spent: 40 minutes .    Efrain Sella, MD [facility] Pager [phone]  If 7PM-7AM, please  contact night-coverage [email or web address] Password [facility] 04/29/2017, 9:04 AM

## 2017-04-29 NOTE — Progress Notes (Signed)
ANTICOAGULATION CONSULT NOTE - Follow Up Consult  Pharmacy Consult for Heparin Indication: NSTEMI  No Known Allergies  Patient Measurements: Height: 5' 10.5" (179.1 cm) Weight: 186 lb 6.4 oz (84.6 kg) IBW/kg (Calculated) : 74.15 Heparin Dosing Weight:  85 kg  Vital Signs: Temp: 98.1 F (36.7 C) (07/22 2005) Temp Source: Oral (07/22 2005) BP: 134/78 (07/22 2005) Pulse Rate: 68 (07/22 2005)  Labs:  Recent Labs  04/27/17 0444  04/28/17 1151 04/28/17 1641 04/28/17 2317 04/29/17 0500 04/29/17 1329 04/29/17 1957 04/29/17 2311  HGB 16.6  --   --   --   --   --  15.9  --   --   HCT 50.3  --   --   --   --   --  47.3  --   --   PLT 159  --   --   --   --   --  152  --   --   LABPROT  --   --   --   --   --   --   --  11.8  --   INR  --   --   --   --   --   --   --  0.87  --   HEPARINUNFRC  --   --   --  0.37  --   --  0.24*  --  0.31  CREATININE 1.15  --   --   --   --  1.07  --   --   --   TROPONINI  --   < > 16.19* 16.39* 10.93*  --   --   --   --   < > = values in this interval not displayed.  Estimated Creatinine Clearance: 51 mL/min (by C-G formula based on SCr of 1.07 mg/dL).  Assessment: 56 yoM atypically presenting with NSTEMI.  PE has been ruled out with CT Angio. CBC stable WNL  Heparin level therapeutic: 0.31- no bleeding or infusion issues documented  Of note, patient had hematuria on 7/21, verified with nurse again today that there were no recent bleeding episodes   Goal of Therapy:  Heparin level 0.3-0.7 units/ml Monitor platelets by anticoagulation protocol: Yes   Plan:  Increase heparin gtt to 1200 units/hr Daily heparin level/ CBC Monitor for s/sx of bleeding  Ruben Im, PharmD Clinical Pharmacist 04/29/2017 11:50 PM

## 2017-04-30 ENCOUNTER — Encounter (HOSPITAL_COMMUNITY): Payer: Self-pay | Admitting: Cardiology

## 2017-04-30 ENCOUNTER — Inpatient Hospital Stay (HOSPITAL_COMMUNITY)
Admission: EM | Disposition: A | Discharge status: Home Health | Payer: Self-pay | Source: Home / Self Care | Attending: Cardiovascular Disease

## 2017-04-30 DIAGNOSIS — I5043 Acute on chronic combined systolic (congestive) and diastolic (congestive) heart failure: Secondary | ICD-10-CM | POA: Diagnosis present

## 2017-04-30 DIAGNOSIS — I350 Nonrheumatic aortic (valve) stenosis: Secondary | ICD-10-CM

## 2017-04-30 HISTORY — PX: LEFT HEART CATH AND CORONARY ANGIOGRAPHY: CATH118249

## 2017-04-30 LAB — CBC
HCT: 47.3 % (ref 39.0–52.0)
Hemoglobin: 15.4 g/dL (ref 13.0–17.0)
MCH: 29.6 pg (ref 26.0–34.0)
MCHC: 32.6 g/dL (ref 30.0–36.0)
MCV: 91 fL (ref 78.0–100.0)
PLATELETS: 152 10*3/uL (ref 150–400)
RBC: 5.2 MIL/uL (ref 4.22–5.81)
RDW: 13.7 % (ref 11.5–15.5)
WBC: 7.5 10*3/uL (ref 4.0–10.5)

## 2017-04-30 LAB — BASIC METABOLIC PANEL
ANION GAP: 7 (ref 5–15)
BUN: 18 mg/dL (ref 6–20)
CO2: 31 mmol/L (ref 22–32)
Calcium: 8.9 mg/dL (ref 8.9–10.3)
Chloride: 99 mmol/L — ABNORMAL LOW (ref 101–111)
Creatinine, Ser: 1.13 mg/dL (ref 0.61–1.24)
GFR calc Af Amer: >60 mL/min (ref >60)
GFR, EST NON AFRICAN AMERICAN: 56 mL/min — AB (ref >60)
GLUCOSE: 95 mg/dL (ref 65–99)
POTASSIUM: 3.6 mmol/L (ref 3.5–5.1)
SODIUM: 137 mmol/L (ref 135–145)

## 2017-04-30 LAB — HEPARIN LEVEL (UNFRACTIONATED): Heparin Unfractionated: 0.31 IU/mL (ref 0.30–0.70)

## 2017-04-30 SURGERY — LEFT HEART CATH AND CORONARY ANGIOGRAPHY
Anesthesia: LOCAL

## 2017-04-30 MED ORDER — SODIUM CHLORIDE 0.9% FLUSH
3.0000 mL | Freq: Two times a day (BID) | INTRAVENOUS | Status: DC
  Administered 2017-04-30: 3 mL via INTRAVENOUS

## 2017-04-30 MED ORDER — SODIUM CHLORIDE 0.9% FLUSH: 3.0000 mL | INTRAVENOUS | Status: DC | PRN

## 2017-04-30 MED ORDER — SODIUM CHLORIDE 0.9 % WEIGHT BASED INFUSION
1.0000 mL/kg/h | INTRAVENOUS | Status: DC
  Administered 2017-04-30 (×2): 1 mL/kg/h via INTRAVENOUS

## 2017-04-30 MED ORDER — HEPARIN (PORCINE) IN NACL 2-0.9 UNIT/ML-% IJ SOLN
INTRAMUSCULAR | Status: DC | PRN
  Administered 2017-04-30: 18:00:00

## 2017-04-30 MED ORDER — HYDRALAZINE HCL 20 MG/ML IJ SOLN: 10.0000 mg | INTRAMUSCULAR | Status: DC | PRN

## 2017-04-30 MED ORDER — FENTANYL CITRATE (PF) 100 MCG/2ML IJ SOLN
INTRAMUSCULAR | Status: DC | PRN
  Administered 2017-04-30: 25 ug via INTRAVENOUS

## 2017-04-30 MED ORDER — MIDAZOLAM HCL 2 MG/2ML IJ SOLN
INTRAMUSCULAR | Status: DC | PRN
  Administered 2017-04-30: 0.5 mg via INTRAVENOUS

## 2017-04-30 MED ORDER — MORPHINE SULFATE (PF) 2 MG/ML IV SOLN: 2.0000 mg | INTRAVENOUS | Status: DC | PRN

## 2017-04-30 MED ORDER — ONDANSETRON HCL 4 MG/2ML IJ SOLN: 4.0000 mg | Freq: Four times a day (QID) | INTRAMUSCULAR | Status: DC | PRN

## 2017-04-30 MED ORDER — ACETAMINOPHEN 325 MG PO TABS: 650.0000 mg | ORAL_TABLET | ORAL | Status: DC | PRN

## 2017-04-30 MED ORDER — FENTANYL CITRATE (PF) 100 MCG/2ML IJ SOLN
INTRAMUSCULAR | Status: AC
  Filled 2017-04-30: qty 2

## 2017-04-30 MED ORDER — SODIUM CHLORIDE 0.9 % IV SOLN: INTRAVENOUS | Status: AC

## 2017-04-30 MED ORDER — LIDOCAINE HCL (PF) 1 % IJ SOLN
INTRAMUSCULAR | Status: DC | PRN
  Administered 2017-04-30: 15 mL

## 2017-04-30 MED ORDER — VERAPAMIL HCL 2.5 MG/ML IV SOLN
INTRAVENOUS | Status: AC
  Filled 2017-04-30: qty 2

## 2017-04-30 MED ORDER — HEPARIN (PORCINE) IN NACL 2-0.9 UNIT/ML-% IJ SOLN
INTRAMUSCULAR | Status: AC
  Filled 2017-04-30: qty 1000

## 2017-04-30 MED ORDER — MIDAZOLAM HCL 2 MG/2ML IJ SOLN
INTRAMUSCULAR | Status: AC
  Filled 2017-04-30: qty 2

## 2017-04-30 MED ORDER — SODIUM CHLORIDE 0.9 % IV SOLN: 250.0000 mL | INTRAVENOUS | Status: DC | PRN

## 2017-04-30 MED ORDER — NITROGLYCERIN 1 MG/10 ML FOR IR/CATH LAB
INTRA_ARTERIAL | Status: AC
  Filled 2017-04-30: qty 10

## 2017-04-30 MED ORDER — ASPIRIN 81 MG PO CHEW: 81.0000 mg | CHEWABLE_TABLET | Freq: Every day | ORAL | Status: DC

## 2017-04-30 MED ORDER — FUROSEMIDE 10 MG/ML IJ SOLN
40.0000 mg | Freq: Once | INTRAMUSCULAR | Status: AC
  Administered 2017-04-30: 40 mg via INTRAVENOUS
  Filled 2017-04-30: qty 4

## 2017-04-30 MED ORDER — SODIUM CHLORIDE 0.9% FLUSH
3.0000 mL | Freq: Two times a day (BID) | INTRAVENOUS | Status: DC
  Administered 2017-05-02 – 2017-05-08 (×10): 3 mL via INTRAVENOUS

## 2017-04-30 MED ORDER — SODIUM CHLORIDE 0.9 % WEIGHT BASED INFUSION
3.0000 mL/kg/h | INTRAVENOUS | Status: DC
  Administered 2017-04-30: 3 mL/kg/h via INTRAVENOUS

## 2017-04-30 MED ORDER — LIDOCAINE HCL (PF) 1 % IJ SOLN
INTRAMUSCULAR | Status: AC
  Filled 2017-04-30: qty 30

## 2017-04-30 MED ORDER — IOPAMIDOL (ISOVUE-370) INJECTION 76%
INTRAVENOUS | Status: AC
  Filled 2017-04-30: qty 100

## 2017-04-30 MED ORDER — HEPARIN SODIUM (PORCINE) 1000 UNIT/ML IJ SOLN
INTRAMUSCULAR | Status: AC
  Filled 2017-04-30: qty 1

## 2017-04-30 MED ORDER — IOPAMIDOL (ISOVUE-370) INJECTION 76%
INTRAVENOUS | Status: DC | PRN
  Administered 2017-04-30: 60 mL

## 2017-04-30 MED ORDER — HEPARIN (PORCINE) IN NACL 100-0.45 UNIT/ML-% IJ SOLN
1200.0000 [IU]/h | INTRAMUSCULAR | Status: DC
  Administered 2017-04-30 – 2017-05-03 (×3): 1200 [IU]/h via INTRAVENOUS
  Filled 2017-04-30 (×4): qty 250

## 2017-04-30 SURGICAL SUPPLY — 10 items
CATH INFINITI 5FR JL5 (CATHETERS) ×2 IMPLANT
CATH INFINITI 5FR MULTPACK ANG (CATHETERS) ×2 IMPLANT
KIT HEART LEFT (KITS) ×2 IMPLANT
PACK CARDIAC CATHETERIZATION (CUSTOM PROCEDURE TRAY) ×2 IMPLANT
SHEATH PINNACLE 5F 10CM (SHEATH) ×2 IMPLANT
TRANSDUCER W/STOPCOCK (MISCELLANEOUS) ×2 IMPLANT
TUBING CIL FLEX 10 FLL-RA (TUBING) ×2 IMPLANT
WIRE EMERALD 3MM-J .035X150CM (WIRE) ×2 IMPLANT
WIRE EMERALD 3MM-J .035X260CM (WIRE) ×2 IMPLANT
WIRE HI TORQ VERSACORE-J 145CM (WIRE) ×2 IMPLANT

## 2017-04-30 NOTE — Progress Notes (Signed)
ANTICOAGULATION CONSULT NOTE - Follow Up Consult  Pharmacy Consult for Heparin Indication: NSTEMI  No Known Allergies  Patient Measurements: Height: 5' 10.5" (179.1 cm) Weight: 188 lb 3.2 oz (85.4 kg) IBW/kg (Calculated) : 74.15 Heparin Dosing Weight:  85 kg  Vital Signs: Temp: 98.1 F (36.7 C) (07/23 1243) BP: 132/71 (07/23 1853) Pulse Rate: 70 (07/23 1853)  Labs:  Recent Labs  04/28/17 1151  04/28/17 1641 04/28/17 2317 04/29/17 0500 04/29/17 1329 04/29/17 1957 04/29/17 2311 04/30/17 0323  HGB  --   --   --   --   --  15.9  --   --  15.4  HCT  --   --   --   --   --  47.3  --   --  47.3  PLT  --   --   --   --   --  152  --   --  152  LABPROT  --   --   --   --   --   --  11.8  --   --   INR  --   --   --   --   --   --  0.87  --   --   HEPARINUNFRC  --   < > 0.37  --   --  0.24*  --  0.31 0.31  CREATININE  --   --   --   --  1.07  --   --   --  1.13  TROPONINI 16.19*  --  16.39* 10.93*  --   --   --   --   --   < > = values in this interval not displayed.  Estimated Creatinine Clearance: 48.3 mL/min (by C-G formula based on SCr of 1.13 mg/dL).  Assessment: 56 yoM atypically presenting with NSTEMI.  PE has been ruled out with CT Angio. CBC stable WNL  Of note, patient had hematuria on 7/21, verified with nurse again today that there were no recent bleeding episodes  Now s/p cath lab, planning surgical consult.  Pharmacy asked to resume IV heparin 4 hrs after sheath pull, removed at 1818 PM.  Goal of Therapy:  Heparin level 0.3-0.7 units/ml Monitor platelets by anticoagulation protocol: Yes   Plan:  Resume heparin at 1200 units/hr at 2230 PM. Check heparin level 8 hrs after gtt resumed. Daily heparin level and CBC. F/u plans for CABG.  Tad Moore, BCPS  Clinical Pharmacist Pager [phone]  04/30/2017 7:46 PM

## 2017-04-30 NOTE — Progress Notes (Signed)
    Patient previously seen and examined this morning. He is stable. Angina free. No heart failure symptoms. Pending cardiac catheterization today.  Further plans per Note.  Bryan Lemma, M.D., M.S. Interventional Cardiologist   Pager # [phone] Phone # [phone] [address] [place], [place] [address]

## 2017-04-30 NOTE — Progress Notes (Signed)
Pt sent to the cath lab with the transporter. Family at bedside and pt stable at this time. Blood pressure 136/81, pulse 66, temperature 98.1 F (36.7 C), resp. rate (!) 21, height 5' 10.5" (1.791 m), weight 85.4 kg (188 lb 3.2 oz), SpO2 100 %.

## 2017-04-30 NOTE — Care Management Important Message (Signed)
Important Message  Patient Details  Name: Scott [name] MRN: [id number] Date of Birth: 06/22/1927   Medicare Important Message Given:  Yes    [name] Abena 04/30/2017, 9:08 AM

## 2017-04-30 NOTE — Progress Notes (Signed)
PROGRESS NOTE    Scott [name]  BJY:[id number] DOB: 05-Mar-1927 DOA: 04/27/2017 PCP: In [place] .    Brief Narrative:    83 y.o. male with a PMH of congestive heart failure, coronary artery disease status post MI and stents 2, obstructive sleep apnea on CPAP for the past month, history of angina, history of controlled hypertension and hypothyroidism who presents to the ED with a chief complaint of the sudden onset of extreme dyspnea .  Assessment & Plan:   1-Non-ST elevation MI : Atypical presentation , EKG with a new ischemic changes and frequent PVC, troponin has peaked to 37 and has trending down , is currently chest pain-free , SOB is better ,  continue medical management with heparin drip, asa/ Plavix/Coreg/statin/vasotec and NTG PRN , Plan for LHC today . 2-History of ischemic cardiomyopathy  status post 2 stents in the past: He has a low EF,  2D cho showed depressed LVEF to 30-35%  and Mod AS , currently euvolemic , compensated . 3-Hx of HTN : Better controlled, continue beta blocker/ Vasotec. 4-hyperlipidemia continue statin 5-history of hypothyroidism continue synthroid. 6-Hx of Gout : stable , continue Allopurinol . 7-Mild gross hematuria : in the setting of AC , mild ,improving .  DVT prophylaxis: (anticoagulated) Code Status: (Full) Family Communication: (wife) Disposition Plan: (home)   Consultants:   Cardiology  Procedures: none.   Antimicrobials:    None       Subjective:  No issues this morning, denies any chest pain shortness of breath fevers or chills nausea or vomiting , ready for the left heart cath.  Objective: Vitals:   04/29/17 0800 04/29/17 2005 04/30/17 0000 04/30/17 0455  BP: 122/77 134/78 124/88 113/65  Pulse: 66 68  61  Resp: 18 (!) 23  18  Temp: 98 F (36.7 C) 98.1 F (36.7 C) 98 F (36.7 C) 98 F (36.7 C)  TempSrc: Oral Oral Oral Oral  SpO2: 94% 99%  99%  Weight:    85.4 kg (188 lb 3.2 oz)  Height:        Intake/Output  Summary (Last 24 hours) at 04/30/17 0908 Last data filed at 04/30/17 0610  Gross per 24 hour  Intake           335.09 ml  Output              800 ml  Net          -464.91 ml   Filed Weights   04/28/17 0557 04/29/17 0447 04/30/17 0455  Weight: 85.2 kg (187 lb 13.3 oz) 84.6 kg (186 lb 6.4 oz) 85.4 kg (188 lb 3.2 oz)    Examination:  General exam: NAD. Respiratory system: clear to aus B/L . Cardiovascular system:  RRR ,S1S2, sys murmer in the aortic area. Gastrointestinal system: Abdomen is nondistended, soft and nontender. No organomegaly or masses felt. Normal bowel sounds heard. Central nervous system: Alert and oriented. No focal neurological deficits. Extremities: Symmetric 5 x 5 power. Skin: No rashes, lesions or ulcers Psychiatry: Judgement and insight appear normal. Mood & affect appropriate.     Data Reviewed: I have personally reviewed following labs and imaging studies  CBC:  Recent Labs Lab 04/27/17 0444 04/29/17 1329 04/30/17 0323  WBC 10.6* 7.4 7.5  HGB 16.6 15.9 15.4  HCT 50.3 47.3 47.3  MCV 93.5 90.6 91.0  PLT 159 152 152   Basic Metabolic Panel:  Recent Labs Lab 04/27/17 0444 04/29/17 0500 04/30/17 0323  NA 141 140 137  K 4.1 3.5 3.6  CL 105 100* 99*  CO2 22 27 31   GLUCOSE 213* 88 95  BUN 20 18 18   CREATININE 1.15 1.07 1.13  CALCIUM 8.9 9.0 8.9   GFR: Estimated Creatinine Clearance: 48.3 mL/min (by C-G formula based on SCr of 1.13 mg/dL). Liver Function Tests:  Recent Labs Lab 04/27/17 0444  AST 28  ALT 22  ALKPHOS 71  BILITOT 1.0  PROT 7.6  ALBUMIN 4.3   No results for input(s): LIPASE, AMYLASE in the last 168 hours. No results for input(s): AMMONIA in the last 168 hours. Coagulation Profile:  Recent Labs Lab 04/29/17 1957  INR 0.87   Cardiac Enzymes:  Recent Labs Lab 04/27/17 1512 04/28/17 1151 04/28/17 1641 04/28/17 2317  TROPONINI 37.52* 16.19* 16.39* 10.93*   BNP (last 3 results) No results for input(s):  PROBNP in the last 8760 hours. HbA1C: No results for input(s): HGBA1C in the last 72 hours. CBG:  Recent Labs Lab 04/27/17 0457  GLUCAP 185*   Lipid Profile: No results for input(s): CHOL, HDL, LDLCALC, TRIG, CHOLHDL, LDLDIRECT in the last 72 hours. Thyroid Function Tests:  Recent Labs  04/27/17 1512  TSH 0.713   Anemia Panel: No results for input(s): VITAMINB12, FOLATE, FERRITIN, TIBC, IRON, RETICCTPCT in the last 72 hours. Urine analysis: No results found for: COLORURINE, APPEARANCEUR, LABSPEC, PHURINE, GLUCOSEU, HGBUR, BILIRUBINUR, KETONESUR, PROTEINUR, UROBILINOGEN, NITRITE, LEUKOCYTESUR Sepsis Labs: @LABRCNTIP (procalcitonin:4,lacticidven:4)  ) Recent Results (from the past 240 hour(s))  MRSA PCR Screening     Status: None   Collection Time: 04/27/17  3:00 PM  Result Value Ref Range Status   MRSA by PCR NEGATIVE NEGATIVE Final    Comment:        The GeneXpert MRSA Assay (FDA approved for NASAL specimens only), is one component of a comprehensive MRSA colonization surveillance program. It is not intended to diagnose MRSA infection nor to guide or monitor treatment for MRSA infections.          Radiology Studies: No results found.      Scheduled Meds: . allopurinol  100 mg Oral Daily  . aspirin EC  81 mg Oral Daily  . atorvastatin  80 mg Oral q1800  . carvedilol  6.25 mg Oral BID WC  . clopidogrel  75 mg Oral Daily  . enalapril  5 mg Oral QHS  . fluticasone   Topical BID  . levothyroxine  150 mcg Oral QAC breakfast  . multivitamin with minerals  1 tablet Oral Daily  . sodium chloride flush  3 mL Intravenous Q12H  . sodium chloride flush  3 mL Intravenous Q12H   Continuous Infusions: . sodium chloride    . sodium chloride    . [START ON 05/01/2017] sodium chloride 3 mL/kg/hr (04/30/17 0602)   Followed by  . [START ON 05/01/2017] sodium chloride 1 mL/kg/hr (04/30/17 0824)  . heparin 1,200 Units/hr (04/30/17 0827)     LOS: 3 days    Time  spent: 40 minutes .    Efrain Sella, MD [facility] Pager [phone]  If 7PM-7AM, please contact night-coverage [email or web address] Password [email or web address] 04/30/2017, 9:08 AM

## 2017-04-30 NOTE — Progress Notes (Signed)
No new orders regarding heparin gtt post cath however per cath note, Dr. Allyson Sabal states that gtt to be resumed in 4 hrs with no bolus. PA on call notified and order for gtt to resume in 4 hrs. Pharmacy notified. Will continue to monitor pt.

## 2017-04-30 NOTE — Interval H&P Note (Signed)
Cath Lab Visit (complete for each Cath Lab visit)  Clinical Evaluation Leading to the Procedure:   ACS: Yes.    Non-ACS:    Anginal Classification: CCS III  Anti-ischemic medical therapy: Maximal Therapy (2 or more classes of medications)  Non-Invasive Test Results: No non-invasive testing performed  Prior CABG: No previous CABG      History and Physical Interval Note:  04/30/2017 5:27 PM  Emmerson Neary  has presented today for surgery, with the diagnosis of unstable angina  The various methods of treatment have been discussed with the patient and family. After consideration of risks, benefits and other options for treatment, the patient has consented to  Procedure(s): Left Heart Cath and Coronary Angiography (N/A) as a surgical intervention .  The patient's history has been reviewed, patient examined, no change in status, stable for surgery.  I have reviewed the patient's chart and labs.  Questions were answered to the patient's satisfaction.     [name]

## 2017-04-30 NOTE — H&P (View-Only) (Signed)
    Patient previously seen and examined this morning. He is stable. Angina free. No heart failure symptoms. Pending cardiac catheterization today.  Further plans per Note.  David Harding, M.D., M.S. Interventional Cardiologist   Pager # [phone] Phone # [phone] [address] , [place] [address]  

## 2017-05-01 ENCOUNTER — Encounter (HOSPITAL_COMMUNITY): Payer: Self-pay | Admitting: Cardiovascular Disease

## 2017-05-01 DIAGNOSIS — J9601 Acute respiratory failure with hypoxia: Secondary | ICD-10-CM

## 2017-05-01 DIAGNOSIS — J810 Acute pulmonary edema: Secondary | ICD-10-CM

## 2017-05-01 DIAGNOSIS — I5042 Chronic combined systolic (congestive) and diastolic (congestive) heart failure: Secondary | ICD-10-CM

## 2017-05-01 DIAGNOSIS — I350 Nonrheumatic aortic (valve) stenosis: Secondary | ICD-10-CM

## 2017-05-01 DIAGNOSIS — I252 Old myocardial infarction: Secondary | ICD-10-CM

## 2017-05-01 LAB — BASIC METABOLIC PANEL
ANION GAP: 11 (ref 5–15)
BUN: 14 mg/dL (ref 6–20)
CHLORIDE: 100 mmol/L — AB (ref 101–111)
CO2: 27 mmol/L (ref 22–32)
CREATININE: 1.03 mg/dL (ref 0.61–1.24)
Calcium: 8.9 mg/dL (ref 8.9–10.3)
GFR calc non Af Amer: >60 mL/min (ref >60)
Glucose, Bld: 90 mg/dL (ref 65–99)
Potassium: 3.6 mmol/L (ref 3.5–5.1)
SODIUM: 138 mmol/L (ref 135–145)

## 2017-05-01 LAB — CBC
HEMATOCRIT: 48.5 % (ref 39.0–52.0)
Hemoglobin: 15.8 g/dL (ref 13.0–17.0)
MCH: 29.6 pg (ref 26.0–34.0)
MCHC: 32.6 g/dL (ref 30.0–36.0)
MCV: 91 fL (ref 78.0–100.0)
PLATELETS: 148 10*3/uL — AB (ref 150–400)
RBC: 5.33 MIL/uL (ref 4.22–5.81)
RDW: 14 % (ref 11.5–15.5)
WBC: 7.2 10*3/uL (ref 4.0–10.5)

## 2017-05-01 LAB — HEPARIN LEVEL (UNFRACTIONATED)
HEPARIN UNFRACTIONATED: 0.18 [IU]/mL — AB (ref 0.30–0.70)
Heparin Unfractionated: 0.35 IU/mL (ref 0.30–0.70)

## 2017-05-01 LAB — POCT ACTIVATED CLOTTING TIME: Activated Clotting Time: 103 seconds

## 2017-05-01 MED ORDER — POTASSIUM CHLORIDE CRYS ER 20 MEQ PO TBCR
20.0000 meq | EXTENDED_RELEASE_TABLET | Freq: Once | ORAL | Status: AC
  Administered 2017-05-01: 20 meq via ORAL
  Filled 2017-05-01: qty 1

## 2017-05-01 MED FILL — Nitroglycerin IV Soln 100 MCG/ML in D5W: INTRA_ARTERIAL | Qty: 10 | Status: AC

## 2017-05-01 MED FILL — Verapamil HCl IV Soln 2.5 MG/ML: INTRAVENOUS | Qty: 2 | Status: AC

## 2017-05-01 NOTE — Progress Notes (Signed)
ANTICOAGULATION CONSULT NOTE - Follow Up Consult  Pharmacy Consult:  Heparin Indication: NSTEMI  No Known Allergies  Patient Measurements: Height: 5' 10.5" (179.1 cm) Weight: 187 lb (84.8 kg) IBW/kg (Calculated) : 74.15 Heparin Dosing Weight: 85 kg  Vital Signs: Temp: 98.3 F (36.8 C) (07/24 0729) Temp Source: Oral (07/24 0729) BP: 109/54 (07/24 0630) Pulse Rate: 61 (07/24 0729)  Labs:  Recent Labs  04/28/17 1151  04/28/17 1641 04/28/17 2317 04/29/17 0500  04/29/17 1329 04/29/17 1957  04/30/17 0323 05/01/17 0207 05/01/17 0731  HGB  --   --   --   --   --   < > 15.9  --   --  15.4 15.8  --   HCT  --   --   --   --   --   --  47.3  --   --  47.3 48.5  --   PLT  --   --   --   --   --   --  152  --   --  152 148*  --   LABPROT  --   --   --   --   --   --   --  11.8  --   --   --   --   INR  --   --   --   --   --   --   --  0.87  --   --   --   --   HEPARINUNFRC  --   < > 0.37  --   --   --  0.24*  --   < > 0.31 0.18* 0.35  CREATININE  --   --   --   --  1.07  --   --   --   --  1.13 1.03  --   TROPONINI 16.19*  --  16.39* 10.93*  --   --   --   --   --   --   --   --   < > = values in this interval not displayed.  Estimated Creatinine Clearance: 53 mL/min (by C-G formula based on SCr of 1.03 mg/dL).   Assessment: 61 YOM atypically presenting with NSTEMI.  PE has been ruled out with CT Angio.  Patient is c/p cath and Pharmacy asked to continue IV heparin for possible CABG.  Heparin level is therapeutic; no bleeding reported (hematuria resolved).   Goal of Therapy:  Heparin level 0.3-0.7 units/ml Monitor platelets by anticoagulation protocol: Yes    Plan:  Continue heparin gtt at 1200 units/hr Daily heparin level and CBC F/U CVTS consult   Terren Haberle D. Laney Potash, PharmD, BCPS Pager:  [phone] 05/01/2017, 11:37 AM

## 2017-05-01 NOTE — Progress Notes (Signed)
Patient states he has been having trouble with mask leaking through the night. Patient states he will try again tomorrow night but for now he wants to just wear his oxygen. RT informed patient if he changes his mind have RN contact RT.

## 2017-05-01 NOTE — Progress Notes (Signed)
Progress Note  Patient Name: Scott [name] Date of Encounter: 05/01/2017  Primary Cardiologist: In [place] Dr. Sol Blazing  Subjective   No chest pain or SOB currently  Inpatient Medications    Scheduled Meds: . allopurinol  100 mg Oral Daily  . aspirin EC  81 mg Oral Daily  . atorvastatin  80 mg Oral q1800  . carvedilol  6.25 mg Oral BID WC  . clopidogrel  75 mg Oral Daily  . enalapril  5 mg Oral QHS  . fluticasone   Topical BID  . levothyroxine  150 mcg Oral QAC breakfast  . multivitamin with minerals  1 tablet Oral Daily  . sodium chloride flush  3 mL Intravenous Q12H  . sodium chloride flush  3 mL Intravenous Q12H   Continuous Infusions: . sodium chloride    . sodium chloride    . heparin 1,200 Units/hr (04/30/17 2243)   PRN Meds: sodium chloride, sodium chloride, acetaminophen, hydrALAZINE, morphine injection, nitroGLYCERIN, ondansetron (ZOFRAN) IV, sodium chloride flush, sodium chloride flush   Vital Signs    Vitals:   04/30/17 2223 05/01/17 0025 05/01/17 0630 05/01/17 0729  BP: 116/68 106/60 (!) 109/54   Pulse: (!) 56 (!) 56 64 61  Resp: 18 17 19 18   Temp:  98.4 F (36.9 C) 98.2 F (36.8 C) 98.3 F (36.8 C)  TempSrc:  Oral Oral Oral  SpO2: 96% 95% 98% 99%  Weight:   187 lb (84.8 kg)   Height:        Intake/Output Summary (Last 24 hours) at 05/01/17 1145 Last data filed at 05/01/17 1100  Gross per 24 hour  Intake            737.4 ml  Output             1550 ml  Net           -812.6 ml   Filed Weights   04/29/17 0447 04/30/17 0455 05/01/17 0630  Weight: 186 lb 6.4 oz (84.6 kg) 188 lb 3.2 oz (85.4 kg) 187 lb (84.8 kg)    Telemetry    SR with PVCs couplets, and PACs - Personally Reviewed  ECG    No new - Personally Reviewed  Physical Exam   GEN: No acute distress.   Neck: No JVD Cardiac: RRR, + 2/6 systolic murmur,  No rubs, or gallops. Rt groin cath site without hematoma Respiratory: Clear to auscultation bilaterally. GI: Soft, nontender,  non-distended  MS: No edema; No deformity. Neuro:  Nonfocal  Psych: Normal affect   Labs    Chemistry Recent Labs Lab 04/27/17 0444 04/29/17 0500 04/30/17 0323 05/01/17 0207  NA 141 140 137 138  K 4.1 3.5 3.6 3.6  CL 105 100* 99* 100*  CO2 22 27 31 27   GLUCOSE 213* 88 95 90  BUN 20 18 18 14   CREATININE 1.15 1.07 1.13 1.03  CALCIUM 8.9 9.0 8.9 8.9  PROT 7.6  --   --   --   ALBUMIN 4.3  --   --   --   AST 28  --   --   --   ALT 22  --   --   --   ALKPHOS 71  --   --   --   BILITOT 1.0  --   --   --   GFRNONAA 55* >60 56* >60  GFRAA >60 >60 >60 >60  ANIONGAP 14 13 7 11      Hematology Recent Labs Lab 04/29/17 1329  04/30/17 0323 05/01/17 0207  WBC 7.4 7.5 7.2  RBC 5.22 5.20 5.33  HGB 15.9 15.4 15.8  HCT 47.3 47.3 48.5  MCV 90.6 91.0 91.0  MCH 30.5 29.6 29.6  MCHC 33.6 32.6 32.6  RDW 13.6 13.7 14.0  PLT 152 152 148*    Cardiac Enzymes Recent Labs Lab 04/27/17 1512 04/28/17 1151 04/28/17 1641 04/28/17 2317  TROPONINI 37.52* 16.19* 16.39* 10.93*    Recent Labs Lab 04/27/17 0451 04/27/17 1323  TROPIPOC 0.03 28.77*     BNP Recent Labs Lab 04/27/17 0444  BNP 608.0*     DDimer  Recent Labs Lab 04/27/17 0444  DDIMER 9.17*     Radiology    No results found.  Cardiac Studies   Cardiac cath 04/30/17 (Images personally reviewed) Procedures   Left Heart Cath and Coronary Angiography  Conclusion     Dist RCA lesion, 80 %stenosed.  Ost Cx lesion, 99 %stenosed.  Mid Cx lesion, 99 %stenosed.  Ramus lesion, 70 %stenosed.  Ost LAD to [facility] LAD lesion, 80 %stenosed.     IMPRESSION:Mr Besancon has three-vessel disease with moderate LV dysfunction. His LAD appears subtotally occluded stenosis circumflex at the ostium and midportion. These are calcified vessels. He is a large ramus branch that reaches his apex with moderate proximal disease. He also has 80% distal dominant RCA at the January just prior to the takeoff of the PDA which gives  right to left collaterals to the circumflex. I crossed the aortic valve and did not demonstrate a gradient. Intervention of his circumflex would be high risk given the angulation of the takeoff and the fact that it is calcified with a essentially occluded LAD. 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 I'm going to suggest that he have surgical consultation. The sheath was removed and pressure held in the Cath Lab. We will restart heparin without bolus in 4 hours. He left the lab in stable condition.   ECHO 04/28/17 Study Conclusions  - Left ventricle: The cavity size was mildly dilated. Systolic   function was moderately to severely reduced. The estimated   ejection fraction was in the range of 30% to 35%. Akinesis of the   mid-apicalanteroseptal and anterior myocardium. Akinesis of the   apical myocardium. - Aortic valve: There was mild to moderate stenosis. Valve area   (VTI): 0.79 cm^2. Valve area (Vmax): 0.87 cm^2. Valve area   (Vmean): 0.83 cm^2. - Left atrium: The atrium was moderately dilated. - Right ventricle: The cavity size was mildly dilated. Wall   thickness was normal. - Right atrium: The atrium was moderately dilated.  Patient Profile     83 y.o. male with PMH CAD s/p MI and stents, obstructive sleep apnea on CPAP, HTN, hypothyroidism who is transferred from [facility] to [facility] for NSTEMI. Pt lives in [place].  Assessment & Plan    1.  Non-STEMI  Troponin 37.52 on BB was on plavix stopped but did have dose today  2.  CAD multivessel TCTS to see, will stop plavix. TCTS to see today 3.  Chronic systolic heart failure-EF around 30-35% with a previous anteroapical infarction, aortic stenosis appears only mild to moderate.  On ACE.  4.  Aortic stenosis mild to mod 5.  Right bundle branch block 6.  PVCs K+ 3.6 will give 20 meq 7. HLD on statin   Hypothyroidism: Continue home dose  Signed, Nada Boozer, NP  05/01/2017, 11:45 AM    I have seen, examined and  evaluated  the patient this AM along with Shelda Jakes.  After reviewing all the available data and chart, we discussed the patients laboratory, study & physical findings as well as symptoms in detail. I agree with her findings, examination as well as impression recommendations as per our discussion.    Attending adjustments noted in italics.   Thankfully, he seems to be doing relatively well on this point. We does note is that over the last several months he's been noticing more progressive exertional dyspnea and fatigue. He was concerned about having this trip [place], but wanted to make the trip to be with his family. He was concerned that he was having something going on with his heart. He knows that for a long time he's had issues with the LAD from his original infarct, but also thinks that there is another vessel that was difficult to work on. He did not expect to have a heart attack however. I personally reviewed the Images. He has a bifurcation distal RCA-PDA-RPAV lesion (that would require either a bifurcation stenting versus jailing a branch vessel if PCI is chosen) as well as a relatively treatable proximal ramus intermedius lesion. The circumflex osteo-proximal lesion does not look like to be favorable for PCI option. It'll be very difficult to get a wire across the lesion and then would require rotational atherectomy in a vessel it is not very large.   -- I do agree that bypass surgery as his best option - especially in light of his chronically diseased LAD with calcified apical aneurysmal scar.  We have contacted CT surgery and are holding his Plavix. Continue aspirin (he is still want to weigh his options, but would like to use a surgical opinion. I'm indicating that I felt that for true complete revascularization, surgery is definitely his best option. I also indicated that PCI would be high risk based on his low EF and extensive disease)  We restarted IV heparin extent of disease and ACS  presentation  High-dose atorvastatin, mild dose of carvedilol and (at likely maximum with his blood pressure)   Bryan Lemma, M.D., M.S. Interventional Cardiologist   Pager # [phone] Phone # [phone] [address] [place], [place] [address]

## 2017-05-01 NOTE — Progress Notes (Signed)
PROGRESS NOTE    Scott [name]  ZOX:[id number] DOB: December 13, 1926 DOA: 04/27/2017 PCP: In [place] .    Brief Narrative:    83 y.o. male with a PMH of congestive heart failure, coronary artery disease status post MI and stents 2, obstructive sleep apnea on CPAP for the past month, history of angina, history of controlled hypertension and hypothyroidism who presents to the ED with a chief complaint of the sudden onset of extreme dyspnea .  Assessment & Plan:   1-Non-ST elevation MI : Atypical presentation , EKG with a new ischemic changes and frequent PVC, troponin has peaked to 37 and has trended down , is currently chest pain-free , SOB is better ,  continue medical management with heparin drip, asa/ Plavix/Coreg/statin/vasotec and NTG PRN . -LHC done 04/30/17 , no report so far , patient states he was told there are 3 vessel disease and discussion of surgery vs medical management is pending  , final recommendations/official report of cardiology is pending .  2-History of ischemic cardiomyopathy  status post 2 stents in the past: He has a low EF,  2D cho showed depressed LVEF to 30-35%  and Mod AS , currently euvolemic , compensated . 3-Hx of HTN : Better controlled, continue beta blocker/ Vasotec. 4-hyperlipidemia continue statin 5-history of hypothyroidism continue synthroid. 6-Hx of Gout : stable , continue Allopurinol . 7-Mild gross hematuria : in the setting of AC , mild ,improving .  DVT prophylaxis: (anticoagulated) Code Status: (Full) Family Communication: (wife) Disposition Plan: (home)   Consultants:   Cardiology  Procedures: none.   Antimicrobials:    None       Subjective:   continuous to do great no SOB no fevers or shortness of breath no orthonea denies any complaints .  Objective: Vitals:   04/30/17 2223 05/01/17 0025 05/01/17 0630 05/01/17 0729  BP: 116/68 106/60 (!) 109/54   Pulse: (!) 56 (!) 56 64 61  Resp: 18 17 19 18   Temp:  98.4 F (36.9  C) 98.2 F (36.8 C) 98.3 F (36.8 C)  TempSrc:  Oral Oral Oral  SpO2: 96% 95% 98% 99%  Weight:   84.8 kg (187 lb)   Height:        Intake/Output Summary (Last 24 hours) at 05/01/17 1042 Last data filed at 05/01/17 0900  Gross per 24 hour  Intake            713.4 ml  Output             1550 ml  Net           -836.6 ml   Filed Weights   04/29/17 0447 04/30/17 0455 05/01/17 0630  Weight: 84.6 kg (186 lb 6.4 oz) 85.4 kg (188 lb 3.2 oz) 84.8 kg (187 lb)    Examination:  General exam: NAD. Respiratory system: clear to aus B/L . Cardiovascular system:  RRR ,S1S2, sys murmer in the aortic area. Gastrointestinal system: Abdomen is nondistended, soft and nontender. No organomegaly or masses felt. Normal bowel sounds heard. Central nervous system: Alert and oriented. No focal neurological deficits. Extremities: Symmetric 5 x 5 power. Skin: No rashes, lesions or ulcers Psychiatry: Judgement and insight appear normal. Mood & affect appropriate.     Data Reviewed: I have personally reviewed following labs and imaging studies  CBC:  Recent Labs Lab 04/27/17 0444 04/29/17 1329 04/30/17 0323 05/01/17 0207  WBC 10.6* 7.4 7.5 7.2  HGB 16.6 15.9 15.4 15.8  HCT 50.3 47.3 47.3 48.5  MCV 93.5 90.6 91.0 91.0  PLT 159 152 152 148*   Basic Metabolic Panel:  Recent Labs Lab 04/27/17 0444 04/29/17 0500 04/30/17 0323 05/01/17 0207  NA 141 140 137 138  K 4.1 3.5 3.6 3.6  CL 105 100* 99* 100*  CO2 22 27 31 27   GLUCOSE 213* 88 95 90  BUN 20 18 18 14   CREATININE 1.15 1.07 1.13 1.03  CALCIUM 8.9 9.0 8.9 8.9   GFR: Estimated Creatinine Clearance: 53 mL/min (by C-G formula based on SCr of 1.03 mg/dL). Liver Function Tests:  Recent Labs Lab 04/27/17 0444  AST 28  ALT 22  ALKPHOS 71  BILITOT 1.0  PROT 7.6  ALBUMIN 4.3   No results for input(s): LIPASE, AMYLASE in the last 168 hours. No results for input(s): AMMONIA in the last 168 hours. Coagulation Profile:  Recent  Labs Lab 04/29/17 1957  INR 0.87   Cardiac Enzymes:  Recent Labs Lab 04/27/17 1512 04/28/17 1151 04/28/17 1641 04/28/17 2317  TROPONINI 37.52* 16.19* 16.39* 10.93*   BNP (last 3 results) No results for input(s): PROBNP in the last 8760 hours. HbA1C: No results for input(s): HGBA1C in the last 72 hours. CBG:  Recent Labs Lab 04/27/17 0457  GLUCAP 185*   Lipid Profile: No results for input(s): CHOL, HDL, LDLCALC, TRIG, CHOLHDL, LDLDIRECT in the last 72 hours. Thyroid Function Tests: No results for input(s): TSH, T4TOTAL, FREET4, T3FREE, THYROIDAB in the last 72 hours. Anemia Panel: No results for input(s): VITAMINB12, FOLATE, FERRITIN, TIBC, IRON, RETICCTPCT in the last 72 hours. Urine analysis: No results found for: COLORURINE, APPEARANCEUR, LABSPEC, PHURINE, GLUCOSEU, HGBUR, BILIRUBINUR, KETONESUR, PROTEINUR, UROBILINOGEN, NITRITE, LEUKOCYTESUR Sepsis Labs: @LABRCNTIP (procalcitonin:4,lacticidven:4)  ) Recent Results (from the past 240 hour(s))  MRSA PCR Screening     Status: None   Collection Time: 04/27/17  3:00 PM  Result Value Ref Range Status   MRSA by PCR NEGATIVE NEGATIVE Final    Comment:        The GeneXpert MRSA Assay (FDA approved for NASAL specimens only), is one component of a comprehensive MRSA colonization surveillance program. It is not intended to diagnose MRSA infection nor to guide or monitor treatment for MRSA infections.          Radiology Studies: No results found.      Scheduled Meds: . allopurinol  100 mg Oral Daily  . aspirin EC  81 mg Oral Daily  . atorvastatin  80 mg Oral q1800  . carvedilol  6.25 mg Oral BID WC  . clopidogrel  75 mg Oral Daily  . enalapril  5 mg Oral QHS  . fluticasone   Topical BID  . levothyroxine  150 mcg Oral QAC breakfast  . multivitamin with minerals  1 tablet Oral Daily  . sodium chloride flush  3 mL Intravenous Q12H  . sodium chloride flush  3 mL Intravenous Q12H   Continuous  Infusions: . sodium chloride    . sodium chloride    . heparin 1,200 Units/hr (04/30/17 2243)     LOS: 4 days    Time spent: 40 minutes .    Efrain Sella, MD [facility] Pager [phone]  If 7PM-7AM, please contact night-coverage [email or web address] Password [facility] 05/01/2017, 10:42 AM

## 2017-05-01 NOTE — Progress Notes (Signed)
ANTICOAGULATION CONSULT NOTE - Follow Up Consult  Pharmacy Consult for Heparin  Indication: chest pain/ACS  No Known Allergies  Patient Measurements: Height: 5' 10.5" (179.1 cm) Weight: 188 lb 3.2 oz (85.4 kg) IBW/kg (Calculated) : 74.15   Vital Signs: Temp: 98.4 F (36.9 C) (07/24 0025) Temp Source: Oral (07/24 0025) BP: 106/60 (07/24 0025) Pulse Rate: 56 (07/24 0025)  Labs:  Recent Labs  04/28/17 1151  04/28/17 1641 04/28/17 2317 04/29/17 0500  04/29/17 1329 04/29/17 1957 04/29/17 2311 04/30/17 0323 05/01/17 0207  HGB  --   --   --   --   --   < > 15.9  --   --  15.4 15.8  HCT  --   --   --   --   --   --  47.3  --   --  47.3 48.5  PLT  --   --   --   --   --   --  152  --   --  152 148*  LABPROT  --   --   --   --   --   --   --  11.8  --   --   --   INR  --   --   --   --   --   --   --  0.87  --   --   --   HEPARINUNFRC  --   < > 0.37  --   --   --  0.24*  --  0.31 0.31 0.18*  CREATININE  --   --   --   --  1.07  --   --   --   --  1.13 1.03  TROPONINI 16.19*  --  16.39* 10.93*  --   --   --   --   --   --   --   < > = values in this interval not displayed.  Estimated Creatinine Clearance: 53 mL/min (by C-G formula based on SCr of 1.03 mg/dL).   Assessment: 83 y/o M s/p cath with plans for surgical consultation.   Heparin level of 0.18 was drawn only 3.5 hours after re-starting heparin.  Goal of Therapy:  Heparin level 0.3-0.7 units/ml Monitor platelets by anticoagulation protocol: Yes   Plan:  -Cont heparin at current rate given level drawn too early -Re-check HL at 0800   Abran Duke 05/01/2017,3:40 AM

## 2017-05-02 ENCOUNTER — Other Ambulatory Visit: Payer: Self-pay | Admitting: *Deleted

## 2017-05-02 DIAGNOSIS — Z8679 Personal history of other diseases of the circulatory system: Secondary | ICD-10-CM

## 2017-05-02 DIAGNOSIS — Z8739 Personal history of other diseases of the musculoskeletal system and connective tissue: Secondary | ICD-10-CM

## 2017-05-02 DIAGNOSIS — R310 Gross hematuria: Secondary | ICD-10-CM

## 2017-05-02 DIAGNOSIS — I214 Non-ST elevation (NSTEMI) myocardial infarction: Secondary | ICD-10-CM

## 2017-05-02 DIAGNOSIS — I25110 Atherosclerotic heart disease of native coronary artery with unstable angina pectoris: Secondary | ICD-10-CM | POA: Diagnosis present

## 2017-05-02 DIAGNOSIS — Z8639 Personal history of other endocrine, nutritional and metabolic disease: Secondary | ICD-10-CM

## 2017-05-02 DIAGNOSIS — I25118 Atherosclerotic heart disease of native coronary artery with other forms of angina pectoris: Secondary | ICD-10-CM

## 2017-05-02 LAB — BASIC METABOLIC PANEL
Anion gap: 7 (ref 5–15)
BUN: 21 mg/dL — ABNORMAL HIGH (ref 6–20)
CHLORIDE: 101 mmol/L (ref 101–111)
CO2: 28 mmol/L (ref 22–32)
CREATININE: 1.02 mg/dL (ref 0.61–1.24)
Calcium: 8.7 mg/dL — ABNORMAL LOW (ref 8.9–10.3)
GFR calc non Af Amer: >60 mL/min (ref >60)
GLUCOSE: 108 mg/dL — AB (ref 65–99)
Potassium: 3.5 mmol/L (ref 3.5–5.1)
Sodium: 136 mmol/L (ref 135–145)

## 2017-05-02 LAB — CBC
HCT: 46.3 % (ref 39.0–52.0)
HEMOGLOBIN: 15.2 g/dL (ref 13.0–17.0)
MCH: 29.5 pg (ref 26.0–34.0)
MCHC: 32.8 g/dL (ref 30.0–36.0)
MCV: 89.9 fL (ref 78.0–100.0)
PLATELETS: 144 10*3/uL — AB (ref 150–400)
RBC: 5.15 MIL/uL (ref 4.22–5.81)
RDW: 13.5 % (ref 11.5–15.5)
WBC: 6.7 10*3/uL (ref 4.0–10.5)

## 2017-05-02 LAB — HEPARIN LEVEL (UNFRACTIONATED): Heparin Unfractionated: 0.36 IU/mL (ref 0.30–0.70)

## 2017-05-02 MED ORDER — ACTIVE PARTNERSHIP FOR HEALTH OF YOUR HEART BOOK
Freq: Once | Status: AC
Start: 2017-05-02 — End: 2017-05-02
  Administered 2017-05-02: 02:00:00
  Filled 2017-05-02: qty 1

## 2017-05-02 MED ORDER — HEART ATTACK BOUNCING BOOK
Freq: Once | Status: AC
  Administered 2017-05-02: 02:00:00
  Filled 2017-05-02: qty 1

## 2017-05-02 NOTE — Progress Notes (Signed)
ANTICOAGULATION CONSULT NOTE - Follow Up Consult  Pharmacy Consult:  Heparin Indication: NSTEMI  No Known Allergies  Patient Measurements: Height: 5' 10.5" (179.1 cm) Weight: 188 lb 8 oz (85.5 kg) IBW/kg (Calculated) : 74.15 Heparin Dosing Weight: 85 kg  Vital Signs: Temp: 97.7 F (36.5 C) (07/25 0900) Temp Source: Oral (07/25 0900) BP: 104/88 (07/25 0900) Pulse Rate: 67 (07/25 0900)  Labs:  Recent Labs  04/29/17 1957  04/30/17 0323 05/01/17 0207 05/01/17 0731 05/02/17 0214  HGB  --   --  15.4 15.8  --  15.2  HCT  --   --  47.3 48.5  --  46.3  PLT  --   --  152 148*  --  144*  LABPROT 11.8  --   --   --   --   --   INR 0.87  --   --   --   --   --   HEPARINUNFRC  --   < > 0.31 0.18* 0.35 0.36  CREATININE  --   --  1.13 1.03  --  1.02  < > = values in this interval not displayed.  Estimated Creatinine Clearance: 53.5 mL/min (by C-G formula based on SCr of 1.02 mg/dL).   Assessment: 54 YOM atypically presenting with NSTEMI.  PE has been ruled out with CT Angio.  Patient is c/p cath and Pharmacy consulted to continue IV heparin for possible CABG.  Heparin level remains therapeutic at 0.36. H&H stable, pltc slightly low at 144 today. No bleeding reported (hematuria resolved).  Goal of Therapy:  Heparin level 0.3-0.7 units/ml Monitor platelets by anticoagulation protocol: Yes   Plan:  Continue heparin gtt at 1200 units/hr Daily heparin level and CBC F/U CVTS consult   Erin N. Zigmund Daniel, PharmD PGY1 Pharmacy Resident Pager: [phone]  05/02/2017, 10:03 AM    Chelsea Aus D. Laney Potash, PharmD, BCPS Pager:  [phone] 05/02/2017, 10:29 AM

## 2017-05-02 NOTE — Progress Notes (Signed)
CARDIAC REHAB PHASE I   PRE:  Rate/Rhythm: 66 SR  BP:  Sitting: 104/88        SaO2: 97 2L  MODE:  Ambulation: 300 ft   POST:  Rate/Rhythm: 75 SR  BP:  Sitting: 125/61         SaO2: 96 RA  Pt ambulated 300 ft on RA, IV, assist x1, slow, mostly steady gait, tolerated well with no complaints, appreciative of walk. Left cardiac surgery booklet and cardiac surgery guidelines for pt and family to review. Will follow up tomorrow.   [id number] Joylene Grapes, RN, BSN 05/02/2017 10:19 AM

## 2017-05-02 NOTE — Progress Notes (Signed)
PROGRESS NOTE    Scott [name]  UKR:[id number] DOB: 11-17-1926 DOA: 04/27/2017 PCP: In [place] .    Brief Narrative:    83 y.o. male with a PMH of congestive heart failure, coronary artery disease status post MI and stents 2, obstructive sleep apnea on CPAP for the past month, history of angina, history of controlled hypertension and hypothyroidism who presents to the ED with a chief complaint of the sudden onset of extreme dyspnea .  Assessment & Plan:   Non-ST elevation MI :  -Atypical presentation , EKG with a new ischemic changes and frequent PVC, troponin has peaked to 37 and has trended down , is currently chest pain-free , SOB is better ,  continue medical management with heparin drip, asa/ Plavix (held) /Coreg/statin/vasotec and NTG PRN . -LHC done 04/30/17 with 3 vessel disease, cardiac surgery consulted, plan to have CABG, awaiting plavix wash out   History of ischemic cardiomyopathy  status post 2 stents in the past: He has a low EF,  2D cho showed depressed LVEF to 30-35%  and Mod AS , currently euvolemic , compensated .  Hx of HTN : Better controlled, continue beta blocker/ Vasotec.  hyperlipidemia continue statin  history of hypothyroidism continue synthroid.  Hx of Gout : stable , continue Allopurinol .  Mild gross hematuria : in the setting of AC , mild ,improving .  DVT prophylaxis: (anticoagulated) Code Status: (Full) Family Communication: (wife and sister at bedside) Disposition Plan: not ready for discharge   Consultants:   Cardiology  Cardiac surgery  Procedures: none.   Antimicrobials:  None      Subjective: Denies chest pain, no sob, no edema, no fever, wife and sister at bedside   Objective: Vitals:   05/01/17 2348 05/02/17 0432 05/02/17 0900 05/02/17 1245  BP: 109/61 114/61 104/88 116/63  Pulse: 64 62 67 66  Resp: 20 15 (!) 21 17  Temp: 98.2 F (36.8 C) 98.3 F (36.8 C) 97.7 F (36.5 C) 98.1 F (36.7 C)  TempSrc: Oral Oral  Oral Oral  SpO2: 98% 96% 98% 95%  Weight:  85.5 kg (188 lb 8 oz)    Height:        Intake/Output Summary (Last 24 hours) at 05/02/17 1540 Last data filed at 05/02/17 0800  Gross per 24 hour  Intake              928 ml  Output              550 ml  Net              378 ml   Filed Weights   04/30/17 0455 05/01/17 0630 05/02/17 0432  Weight: 85.4 kg (188 lb 3.2 oz) 84.8 kg (187 lb) 85.5 kg (188 lb 8 oz)    Examination:  General exam: NAD. Respiratory system: clear to aus B/L . Cardiovascular system:  RRR ,S1S2, sys murmer in the aortic area. Gastrointestinal system: Abdomen is nondistended, soft and nontender. No organomegaly or masses felt. Normal bowel sounds heard. Central nervous system: Alert and oriented. No focal neurological deficits. Extremities: Symmetric 5 x 5 power. Skin: No rashes, lesions or ulcers Psychiatry: Judgement and insight appear normal. Mood & affect appropriate.     Data Reviewed: I have personally reviewed following labs and imaging studies  CBC:  Recent Labs Lab 04/27/17 0444 04/29/17 1329 04/30/17 0323 05/01/17 0207 05/02/17 0214  WBC 10.6* 7.4 7.5 7.2 6.7  HGB 16.6 15.9 15.4 15.8 15.2  HCT 50.3  47.3 47.3 48.5 46.3  MCV 93.5 90.6 91.0 91.0 89.9  PLT 159 152 152 148* 144*   Basic Metabolic Panel:  Recent Labs Lab 04/27/17 0444 04/29/17 0500 04/30/17 0323 05/01/17 0207 05/02/17 0214  NA 141 140 137 138 136  K 4.1 3.5 3.6 3.6 3.5  CL 105 100* 99* 100* 101  CO2 22 27 31 27 28   GLUCOSE 213* 88 95 90 108*  BUN 20 18 18 14  21*  CREATININE 1.15 1.07 1.13 1.03 1.02  CALCIUM 8.9 9.0 8.9 8.9 8.7*   GFR: Estimated Creatinine Clearance: 53.5 mL/min (by C-G formula based on SCr of 1.02 mg/dL). Liver Function Tests:  Recent Labs Lab 04/27/17 0444  AST 28  ALT 22  ALKPHOS 71  BILITOT 1.0  PROT 7.6  ALBUMIN 4.3   No results for input(s): LIPASE, AMYLASE in the last 168 hours. No results for input(s): AMMONIA in the last 168  hours. Coagulation Profile:  Recent Labs Lab 04/29/17 1957  INR 0.87   Cardiac Enzymes:  Recent Labs Lab 04/27/17 1512 04/28/17 1151 04/28/17 1641 04/28/17 2317  TROPONINI 37.52* 16.19* 16.39* 10.93*   BNP (last 3 results) No results for input(s): PROBNP in the last 8760 hours. HbA1C: No results for input(s): HGBA1C in the last 72 hours. CBG:  Recent Labs Lab 04/27/17 0457  GLUCAP 185*   Lipid Profile: No results for input(s): CHOL, HDL, LDLCALC, TRIG, CHOLHDL, LDLDIRECT in the last 72 hours. Thyroid Function Tests: No results for input(s): TSH, T4TOTAL, FREET4, T3FREE, THYROIDAB in the last 72 hours. Anemia Panel: No results for input(s): VITAMINB12, FOLATE, FERRITIN, TIBC, IRON, RETICCTPCT in the last 72 hours. Urine analysis: No results found for: COLORURINE, APPEARANCEUR, LABSPEC, PHURINE, GLUCOSEU, HGBUR, BILIRUBINUR, KETONESUR, PROTEINUR, UROBILINOGEN, NITRITE, LEUKOCYTESUR Sepsis Labs: @LABRCNTIP (procalcitonin:4,lacticidven:4)  ) Recent Results (from the past 240 hour(s))  MRSA PCR Screening     Status: None   Collection Time: 04/27/17  3:00 PM  Result Value Ref Range Status   MRSA by PCR NEGATIVE NEGATIVE Final    Comment:        The GeneXpert MRSA Assay (FDA approved for NASAL specimens only), is one component of a comprehensive MRSA colonization surveillance program. It is not intended to diagnose MRSA infection nor to guide or monitor treatment for MRSA infections.          Radiology Studies: No results found.      Scheduled Meds: . allopurinol  100 mg Oral Daily  . aspirin EC  81 mg Oral Daily  . atorvastatin  80 mg Oral q1800  . carvedilol  6.25 mg Oral BID WC  . enalapril  5 mg Oral QHS  . fluticasone   Topical BID  . levothyroxine  150 mcg Oral QAC breakfast  . multivitamin with minerals  1 tablet Oral Daily  . sodium chloride flush  3 mL Intravenous Q12H  . sodium chloride flush  3 mL Intravenous Q12H   Continuous  Infusions: . sodium chloride    . sodium chloride    . heparin 1,200 Units/hr (05/02/17 0600)     LOS: 5 days    Time spent: 25 minutes .    Albertine Grates, MD PhD [facility] Pager [phone]  If 7PM-7AM, please contact night-coverage [email or web address] Password [facility] 05/02/2017, 3:40 PM

## 2017-05-02 NOTE — Consult Note (Signed)
[address]       [place] [address]             [phone]        Scott [name] [facility] Medical Record #[id number] Date of Birth: May 02, 1927  Referring: Dr. Herbie Baltimore Cardiologist in [place]: Dr. Sol Blazing Dennard Nip, [place])  Chief Complaint:    Chief Complaint  Patient presents with  . Shortness of Breath  Reason for consultation: Coronary artery disease  History of Present Illness:     This is an 83 year old Caucasian male with a past medical history of coronary artery disease (s/p MI, 1987, PCI with stent in 1999 and again in either 2013 or 2014),congestive heart failure,  obstructive sleep apnea, hypertension, hypothyroidism who had a sudden onset of extreme dyspnea and diaphoresis that occurred on 04/27/2017 after getting up to use the bathroom. The patient is here visiting family from [place], [place]. He had a CT scan of the chest which ruled out PE, but did show ectasia of the ascending thoracic aorta (4.3 cm in diameter), and calcifications of the AV. Initial Troponin was 0.03 but it did go as high as 37.52. EKG showed a flutter, PVCs, and Q waves in V1. He ruled in for a NSTEMI and was put on a Heparin drip. Marland Kitchen He underwent a cardiac catheterization by Dr. Allyson Sabal on 04/30/2017 and he was found to have significant 3 vessel coronary artery disease (please review catheterization results below). Dr. Dorris Fetch has been consulted for the consideration of coronary artery bypass grafting surgery. Currently, the patient has no shortness of breath or chest pain. Vital signs are stable.   Current Activity/ Functional Status: Patient is independent with mobility/ambulation, transfers, ADL's, IADL's.   Zubrod Score: At the time of surgery this patient's most appropriate activity status/level should be described as: []     0    Normal activity, no symptoms [x]     1    Restricted in physical strenuous activity but ambulatory, able to do out light work []     2    Ambulatory  and capable of self care, unable to do work activities, up and about more than 50%  Of the time                            []     3    Only limited self care, in bed greater than 50% of waking hours []     4    Completely disabled, no self care, confined to bed or chair []     5    Moribund  Past Medical History:  Diagnosis Date  . Angina pectoris (HCC)   . Chronic combined systolic and diastolic heart failure, NYHA class 2 (HCC) 2014   EF reportedly anywhere from 25-40%  . Coronary artery disease involving native coronary artery    Reportedly multivessel  . Essential hypertension   . Gout   . History of MI (myocardial infarction)   . Hyperlipemia   . Hyperlipidemia   . Hypothyroidism   . Ischemic dilated cardiomyopathy (HCC)   . Multi-vessel coronary artery stenosis   . Varicose veins of both lower extremities     Past Surgical History:  Procedure Laterality Date  . OPEN CHOLECYSTECTOMY    . LEFT HEART CATH AND CORONARY ANGIOGRAPHY N/A 04/30/2017   Procedure: Left Heart Cath and Coronary Angiography;  Surgeon: Runell Gess, MD;  Location: [facility] INVASIVE CV  LAB;  Service: Cardiovascular;  Laterality: N/A;  . VASCULAR SURGERY     Vein stripping on the left and vein ablation on the right     Social History  . Marital status: Married    Spouse name: N/A  . Number of children: 3   Occupational History  . Retired Acupuncturist   Social History Main Topics  . Smoking status: Never Smoker  . Smokeless tobacco: Never Used  . Alcohol use Yes     Comment: Social  . Drug use: No  . Sexual activity: No    Social History Narrative   Remarried, with current wife 1 year. Has 3 children with his former wife. Visiting from [place].   Family History: Problem Relation Age of Onset  . Heart disease Mother Deceased at age 78       Pacemaker  . Bladder Cancer Father   . Bladder Cancer Brother   . Hypertension Son     Allergies: No Known Allergies  Current  Facility-Administered Medications  Medication Dose Route Frequency Provider Last Rate Last Dose  . 0.9 %  sodium chloride infusion  250 mL Intravenous PRN Rama, Christina P, MD      . 0.9 %  sodium chloride infusion  250 mL Intravenous PRN Runell Gess, MD      . acetaminophen (TYLENOL) tablet 650 mg  650 mg Oral Q4H PRN Rama, Maryruth Bun, MD      . allopurinol (ZYLOPRIM) tablet 100 mg  100 mg Oral Daily Rama, Maryruth Bun, MD   100 mg at 05/02/17 0826  . aspirin EC tablet 81 mg  81 mg Oral Daily Jodelle Red, MD   81 mg at 05/02/17 0825  . atorvastatin (LIPITOR) tablet 80 mg  80 mg Oral q1800 Jodelle Red, MD   80 mg at 05/01/17 1720  . carvedilol (COREG) tablet 6.25 mg  6.25 mg Oral BID WC Rama, Maryruth Bun, MD   6.25 mg at 05/02/17 0826  . enalapril (VASOTEC) tablet 5 mg  5 mg Oral QHS Rama, Maryruth Bun, MD   5 mg at 05/01/17 2118  . fluticasone (CUTIVATE) 0.005 % ointment   Topical BID Rama, Maryruth Bun, MD      . heparin ADULT infusion 100 units/mL (25000 units/258mL sodium chloride 0.45%)  1,200 Units/hr Intravenous Continuous Carney, Gwenlyn Found, RPH 12 mL/hr at 05/02/17 0600 1,200 Units/hr at 05/02/17 0600  . hydrALAZINE (APRESOLINE) injection 10 mg  10 mg Intravenous Q4H PRN Runell Gess, MD      . levothyroxine (SYNTHROID, LEVOTHROID) tablet 150 mcg  150 mcg Oral QAC breakfast Rama, Maryruth Bun, MD   150 mcg at 05/02/17 0825  . morphine 2 MG/ML injection 2 mg  2 mg Intravenous Q1H PRN Runell Gess, MD      . multivitamin with minerals tablet 1 tablet  1 tablet Oral Daily Rama, Maryruth Bun, MD   1 tablet at 05/02/17 0825  . nitroGLYCERIN (NITROSTAT) SL tablet 0.4 mg  0.4 mg Sublingual Q5 min PRN Rama, Maryruth Bun, MD   0.4 mg at 04/28/17 1746  . ondansetron (ZOFRAN) injection 4 mg  4 mg Intravenous Q6H PRN Rama, Maryruth Bun, MD   4 mg at 04/29/17 7829  . sodium chloride flush (NS) 0.9 % injection 3 mL  3 mL Intravenous Q12H Rama, Maryruth Bun, MD   3 mL at  05/02/17 0828  . sodium chloride flush (NS) 0.9 % injection 3 mL  3 mL Intravenous PRN Rama, Trula Ore  P, MD      . sodium chloride flush (NS) 0.9 % injection 3 mL  3 mL Intravenous Q12H Runell Gess, MD      . sodium chloride flush (NS) 0.9 % injection 3 mL  3 mL Intravenous PRN Runell Gess, MD        Prescriptions Prior to Admission  Medication Sig Dispense Refill Last Dose  . allopurinol (ZYLOPRIM) 100 MG tablet Take 100 mg by mouth daily.   04/26/2017 at Unknown time  . aspirin 81 MG chewable tablet Chew 81 mg by mouth daily.   04/26/2017 at Unknown time  . CALCIUM CARBONATE PO Take 600 mg by mouth 2 (two) times daily.   04/26/2017 at Unknown time  . carvedilol (COREG) 6.25 MG tablet Take 6.25 mg by mouth 2 (two) times daily with a meal.   04/26/2017 at 9a  . Cholecalciferol (VITAMIN D-1000 MAX ST) 1000 units tablet Take 2,000 Units by mouth every morning.   04/26/2017 at Unknown time  . cyanocobalamin (TH VITAMIN B12) 100 MCG tablet Take 1 tablet by mouth daily. Take 1 tablet by mouth in the evening.   04/26/2017 at Unknown time  . enalapril (VASOTEC) 5 MG tablet Take 5 mg by mouth at bedtime.   04/26/2017 at Unknown time  . fluticasone (CUTIVATE) 0.05 % cream Apply topically 2 (two) times daily.   Past Week at Unknown time  . isosorbide mononitrate (IMDUR) 30 MG 24 hr tablet Take 30 mg by mouth daily.   04/27/2017 at Unknown time  . levothyroxine (SYNTHROID, LEVOTHROID) 150 MCG tablet Take 150 mcg by mouth daily before breakfast.   04/26/2017 at Unknown time  . Lutein 20 MG CAPS Take 1 capsule by mouth every morning.   04/26/2017 at Unknown time  . Multiple Vitamin (MULTIVITAMIN) tablet Take 1 tablet by mouth daily.   04/26/2017 at Unknown time  . nitroGLYCERIN (NITROSTAT) 0.4 MG SL tablet Place 0.4 mg under the tongue every 5 (five) minutes as needed for chest pain.   unknown  . pravastatin (PRAVACHOL) 40 MG tablet Take 40 mg by mouth at bedtime.   04/26/2017 at Unknown time  . Ubiquinol  100 MG CAPS Take 1 capsule by mouth every morning.   04/26/2017 at Unknown time    Family History  Problem Relation Age of Onset  . Heart disease Mother        Pacemaker  . Bladder Cancer Father   . Bladder Cancer Brother   . Hypertension Son      Review of Systems:     Cardiac Review of Systems: Y or N  Chest Pain [  N  ]  Resting SOB [ N  ]Exertional SOB  [Y  ]  Orthopnea [ N ]   Pedal Edema [  N ]    Palpitations [ N ] Syncope  [ N ]   Presyncope [   N]  General Review of Systems: [Y] = yes [  ]=no Constitional:  fatigue [ Y ]; nausea [ N ]; night sweats [ N ]; fever [ N ]; or chills [ N ]                                                                 Eye :  blurred vision Klaus.Mock  ]; diplopia [  N ]; Resp: cough [ N ];  wheezing[ N ];  hemoptysis[ N ];  GI:  vomiting[N  ];  ; melena[ N ];  hematochezia Klaus.Mock  ];  GU:  hematuria[ N ];    Heme/Lymph: bruising[  ];  bleeding[  ];  anemia[  ];  Neuro: TIA[N  ];   Stroke[N  ];    Endocrine: diabetes[ N ];  thyroid dysfunction[ Y ];    Physical Exam: BP 104/88 (BP Location: Right Arm)   Pulse 67   Temp 97.7 F (36.5 C) (Oral)   Resp (!) 21   Ht 5' 10.5" (1.791 m)   Wt 85.5 kg (188 lb 8 oz)   SpO2 98%   BMI 26.66 kg/m    General appearance: alert, cooperative and no distress Head: Normocephalic, without obvious abnormality, atraumatic Neck: no carotid bruit, no JVD and supple, symmetrical, trachea midline Resp: clear to auscultation bilaterally Cardio: RRR, no murmur GI: Soft, non tender, bowel sounds present, well healed cholecystectomy scar RUQ Extremities: varicose veins noted and Venous stasis changes bottome of extremeties, feet warm Neurologic: Grossly normal  Diagnostic Studies & Laboratory data:  Procedures  CLINICAL DATA:  Shortness of breath and chest pain  EXAM: PORTABLE CHEST 1 VIEW  COMPARISON:  None.  FINDINGS: There are extensive bilateral interstitial opacities. Cardiomediastinal contours are  normal. No pneumothorax or sizable pleural effusion.  IMPRESSION: Extensive bilateral interstitial opacities which may indicate moderate interstitial pulmonary edema. In the absence of prior studies for comparison, interstitial lung disease would be difficult to exclude.   Electronically Signed   By: Deatra Robinson M.D.   On: 04/27/2017 05:45  CT ANGIOGRAPHY CHEST WITH CONTRAST  TECHNIQUE: Multidetector CT imaging of the chest was performed using the standard protocol during bolus administration of intravenous contrast. Multiplanar CT image reconstructions and MIPs were obtained to evaluate the vascular anatomy.  CONTRAST:  100 mL of Isovue 370.  COMPARISON:  No priors.  FINDINGS: Cardiovascular: No filling defects in the pulmonary tail tree to suggest underlying pulmonary embolism. Heart size is mildly enlarged. There is no significant pericardial fluid, thickening or pericardial calcification. There is aortic atherosclerosis, as well as atherosclerosis of the great vessels of the mediastinum and the coronary arteries, including calcified atherosclerotic plaque in the left main, left anterior descending, left circumflex and right coronary arteries. Extensive thinning and dystrophic calcifications in the left ventricle involving anterior, anteroseptal and septal wall segments throughout the mid ventricle and apex compatible with extensive remottling and scarring following LAD territory myocardial infarction(s). Calcifications of the aortic valve. Ectasia of ascending thoracic aorta (4.3 cm in diameter).  Mediastinum/Nodes: No pathologically enlarged mediastinal or hilar lymph nodes. Esophagus is unremarkable in appearance. No axillary lymphadenopathy.  Lungs/Pleura: Small bilateral pleural effusions lying dependently. Widespread ground-glass attenuation and interlobular septal thickening throughout the lungs bilaterally is most compatible with a background of  interstitial pulmonary edema. No confluent consolidative airspace disease. No definite suspicious appearing pulmonary nodules or masses are noted.  Upper Abdomen: Aortic atherosclerosis. 4.1 cm exophytic simple cyst in the upper pole of the left kidney. Colonic diverticulosis. Status post cholecystectomy.  Musculoskeletal: There are no aggressive appearing lytic or blastic lesions noted in the visualized portions of the skeleton.  Review of the MIP images confirms the above findings.  IMPRESSION: 1. No evidence of pulmonary embolism. 2. There are findings suggestive of congestive heart failure, as discussed above. 3. Aortic atherosclerosis, in addition to left main  and 3 vessel coronary artery disease. In addition, there is evidence of prior distal LAD territory myocardial infarction(s) with extensive scarring in the left ventricle. 4. There are calcifications of the aortic valve. Echocardiographic correlation for evaluation of potential valvular dysfunction may be warranted if clinically indicated. 5. Ectasia of the ascending thoracic aorta (4.3 cm in diameter). Recommend annual imaging followup by CTA or MRA. This recommendation follows 2010 ACCF/AHA/AATS/ACR/ASA/SCA/SCAI/SIR/STS/SVM Guidelines for the Diagnosis and Management of Patients with Thoracic Aortic Disease. Circulation. 2010; 121: R604-V409. 6. Colonic diverticulosis.  Left Heart Cath and Coronary Angiography by Dr. Allyson Sabal:  Conclusion     Dist RCA lesion, 80 %stenosed.  Ost Cx lesion, 99 %stenosed.  Mid Cx lesion, 99 %stenosed.  Ramus lesion, 70 %stenosed.  Ost LAD to [facility] LAD lesion, 80 %stenosed.   Maverik Whitworth is a 83 y.o. male    [id number] LOCATION:  FACILITY: [facility]  PHYSICIAN: [name], M.D. 1927-09-20   DATE OF PROCEDURE:  04/30/2017     Recent Radiology Findings:   No results found.   I have independently reviewed the above radiologic studies.  Recent Lab Findings: Lab  Results  Component Value Date   WBC 6.7 05/02/2017   HGB 15.2 05/02/2017   HCT 46.3 05/02/2017   PLT 144 (L) 05/02/2017   GLUCOSE 108 (H) 05/02/2017   ALT 22 04/27/2017   AST 28 04/27/2017   NA 136 05/02/2017   K 3.5 05/02/2017   CL 101 05/02/2017   CREATININE 1.02 05/02/2017   BUN 21 (H) 05/02/2017   CO2 28 05/02/2017   TSH 0.713 04/27/2017   INR 0.87 04/29/2017   HGBA1C 5.5 04/27/2017   Assessment / Plan:   1. S/p NSTEMI-continue Heparin drip.  He has been given Plavix (appears last given on 05/01/2017) so we will wait for Plavix washout. He appears to be a candidate for coronary artery bypass grafting surgery. The next available OR date appears to be 05/09/2017. Also, he has had vein stripping on the left and vein ablation on the right. He is not diabetic, so could consider bilateral IMA's as well as radial artery if needed. Dr. Dorris Fetch to review catheterization and ultimately determine management. 2. Hypertension-on Coreg 6.25 mg bid and Enalapril 5 mg at hs. 3. OSA-on CPAP 4. CHF-has been diuresed. LVEF 30-35% 5. Hypothyroidism-on Levothyroxine 6. Hyperlipidemia-on Atorvastatin 80 mg at hs  I  spent 20 minutes counseling the patient face to face and 50% or more the  time was spent in counseling and coordination of care. The total time spent in the appointment was 60 minutes.    Doree Fudge PA-C 05/02/2017 10:22 AM  Patient seen and examined, agree with above. 83 yo man with known CAD with prior MI and previous stent x 2. Also history of CHF, OSA, hypothyroidism, hypertension and hyperlipidemia. Workup has reveraled severe 3 vessel CAD with severe LV dysfunction including calcified scar in the anterior, septal and apical walls. He has probable moderate AS as well with a calculated valve area of 0.8 cm2 by TTE.   He has had bilateral vein procedures and has significant varicosities. It is doubtful he has any usable vein in his legs but will vein map to see if there is  anything that might be a viable graft. We could potentially use the right radial and bilateral IMAs. His Allen's test on the right side is equivocal so it is not certain that is usable. Will see what the vascular lab studies reveal. He would be high risk  for surgery given his advanced age and severe LV dysfunction.  Medical therapy is always an option but he did have chest heaviness this AM despite being on IV heparin. If that continues it may force our hands. He could end up requiring a hybrid type of procedure.  I think it would be a good idea to get a TEE to better assess the aortic valve. That is not a decision I want to make "on the fly" in an elderly man who is already high risk.  Salvatore Decent Dorris Fetch, MD [facility] [phone]

## 2017-05-02 NOTE — Progress Notes (Signed)
Progress Note  Scott Name: Scott [name] Date of Encounter: 05/02/2017  Primary Cardiologist: Scott [name] Scott [name]  Subjective   No chest pain, he basically has back pain is his anginal equivalent. What is noticing now sounds more musculoskeletal. He did not have any of the back pain walking with cardiac rehabilitation. He said he didn't walk fast enough to get short of breath  Inpatient Medications    Scheduled Meds: . allopurinol  100 mg Oral Daily  . aspirin EC  81 mg Oral Daily  . atorvastatin  80 mg Oral q1800  . carvedilol  6.25 mg Oral BID WC  . enalapril  5 mg Oral QHS  . fluticasone   Topical BID  . levothyroxine  150 mcg Oral QAC breakfast  . multivitamin with minerals  1 tablet Oral Daily  . sodium chloride flush  3 mL Intravenous Q12H  . sodium chloride flush  3 mL Intravenous Q12H   Continuous Infusions: . sodium chloride    . sodium chloride    . heparin 1,200 Units/hr (05/02/17 0600)   PRN Meds: sodium chloride, sodium chloride, acetaminophen, hydrALAZINE, morphine injection, nitroGLYCERIN, ondansetron (ZOFRAN) IV, sodium chloride flush, sodium chloride flush   Vital Signs    Vitals:   05/01/17 2348 05/02/17 0432 05/02/17 0900 05/02/17 1245  BP: 109/61 114/61 104/88 116/63  Pulse: 64 62 67 66  Resp: 20 15 (!) 21 17  Temp: 98.2 F (36.8 C) 98.3 F (36.8 C) 97.7 F (36.5 C) 98.1 F (36.7 C)  TempSrc: Oral Oral Oral Oral  SpO2: 98% 96% 98% 95%  Weight:  188 lb 8 oz (85.5 kg)    Height:        Intake/Output Summary (Last 24 hours) at 05/02/17 1541 Last data filed at 05/02/17 0800  Gross per 24 hour  Intake              928 ml  Output              550 ml  Net              378 ml   Filed Weights   04/30/17 0455 05/01/17 0630 05/02/17 0432  Weight: 188 lb 3.2 oz (85.4 kg) 187 lb (84.8 kg) 188 lb 8 oz (85.5 kg)    Telemetry    Intake sinus rhythm with PACs and PVCs - Personally Reviewed  ECG    No new - Personally Reviewed  Physical  Exam   GEN: No acute distress. Resting Scott bed. No acute distress  Neck:  no JVD or bruit Cardiac:  RRR, normal S1-S2. 2/6 SEM at RUSB. No other R/G Respiratory: CTAB, non-labored. Good air movement GI:  soft/NT/ND. NABS MS:  no C/C/E Neuro:  non-focal Psych: Pleasant mood & affect   Labs    Chemistry Recent Labs Lab 04/27/17 0444  04/30/17 0323 05/01/17 0207 05/02/17 0214  NA 141  < > 137 138 136  K 4.1  < > 3.6 3.6 3.5  CL 105  < > 99* 100* 101  CO2 22  < > 31 27 28   GLUCOSE 213*  < > 95 90 108*  BUN 20  < > 18 14 21*  CREATININE 1.15  < > 1.13 1.03 1.02  CALCIUM 8.9  < > 8.9 8.9 8.7*  PROT 7.6  --   --   --   --   ALBUMIN 4.3  --   --   --   --   AST  28  --   --   --   --   ALT 22  --   --   --   --   ALKPHOS 71  --   --   --   --   BILITOT 1.0  --   --   --   --   GFRNONAA 55*  < > 56* >60 >60  GFRAA >60  < > >60 >60 >60  ANIONGAP 14  < > 7 11 7   < > = values Scott this interval not displayed.   Hematology  Recent Labs Lab 04/30/17 0323 05/01/17 0207 05/02/17 0214  WBC 7.5 7.2 6.7  RBC 5.20 5.33 5.15  HGB 15.4 15.8 15.2  HCT 47.3 48.5 46.3  MCV 91.0 91.0 89.9  MCH 29.6 29.6 29.5  MCHC 32.6 32.6 32.8  RDW 13.7 14.0 13.5  PLT 152 148* 144*    Cardiac Enzymes  Recent Labs Lab 04/27/17 1512 04/28/17 1151 04/28/17 1641 04/28/17 2317  TROPONINI 37.52* 16.19* 16.39* 10.93*     Recent Labs Lab 04/27/17 0451 04/27/17 1323  TROPIPOC 0.03 28.77*     BNP  Recent Labs Lab 04/27/17 0444  BNP 608.0*     DDimer   Recent Labs Lab 04/27/17 0444  DDIMER 9.17*     Radiology    No results found.  Cardiac Studies   Cardiac cath 04/30/17 (Images personally reviewed) Procedures   Left Heart Cath and Coronary Angiography  Conclusion     Dist RCA lesion, 80 %stenosed.  Ost Cx lesion, 99 %stenosed.  Mid Cx lesion, 99 %stenosed.  Ramus lesion, 70 %stenosed.  Ost LAD to [facility] LAD lesion, 80 %stenosed.     IMPRESSION:Scott [name] has  three-vessel disease with moderate LV dysfunction. His LAD appears subtotally occluded stenosis circumflex at the ostium and midportion. These are calcified vessels. He is a large ramus branch that reaches his apex with moderate proximal disease. He also has 80% distal dominant RCA at the January just prior to the takeoff of the PDA which gives right to left collaterals to the circumflex. I crossed the aortic valve and did not demonstrate a gradient. Intervention of his circumflex would be high risk given the angulation of the takeoff and the fact that it is calcified with a essentially occluded LAD. 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 I'm going to suggest that he have surgical consultation. The sheath was removed and pressure held Scott the Cath Lab. We will restart heparin without bolus Scott 4 hours. He left the lab Scott stable condition.   ECHO 04/28/17 Study Conclusions  - Left ventricle: The cavity size was mildly dilated. Systolic   function was moderately to severely reduced. The estimated   ejection fraction was Scott the range of 30% to 35%. Akinesis of the   mid-apicalanteroseptal and anterior myocardium. Akinesis of the   apical myocardium. - Aortic valve: There was mild to moderate stenosis. Valve area   (VTI): 0.79 cm^2. Valve area (Vmax): 0.87 cm^2. Valve area   (Vmean): 0.83 cm^2. - Left atrium: The atrium was moderately dilated. - Right ventricle: The cavity size was mildly dilated. Wall   thickness was normal. - Right atrium: The atrium was moderately dilated.  Scott [name]     83 y.o. male with PMH CAD s/p MI and stents, obstructive sleep apnea on CPAP, HTN, hypothyroidism who is transferred from Scott [name] to Scott [name] for NSTEMI. Pt lives Scott [name].  Assessment & Plan  1.  Non-STEMI  Troponin 37.52 - with multivessel CAD   Unclear with the culprit lesion was with basically chronic LAD disease and likely anterior scar, the circumflex is also very severely  diseased and probably not a good PCI target.   Potential PCI options would be the ramus intermedius and bifurcation of the RCA, however with the severity of disease Scott circumflex place, the capping physician felt like bypass surgery was a better option given his low EF.  Has been on Plavix. Stopped post cath  Maintaining IV heparin until we make a decision as to treatment going forward  On aspirin, beta blocker and high-dose statin  2.  Chronic systolic heart failure-EF around 30-35% with a previous anteroapical infarction, aortic stenosis appears only mild to moderate.    On ACE.  3.  Aortic stenosis mild to mod - could potentially be more significant with low EF, if CABG is chosen, would consider Intra-Op TEE evaluation -  4.  Right bundle branch block 5.  PVCs - replete potassium  6. HLD  = high-dose statin   Hypothyroidism: Continue home dose  Had a long talk with Scott today. We're ending cardiac surgery evaluation. I reviewed images with Dr. Excell Seltzer, and appears the circumflex lesion is just not approachable for PCI target. The RCA and ramus lesions are probably not the culprit lesion for his MI, and therefore may not actually help symptoms.   He is hoping to have a good conversation with the cardiac surgeon Scott order to determine best course of action either CABG or probably medical management returning home to his primary cardiologist at home to compare current Films with FILMS and decide any PCI would be indicated. - If he does go home without CABG, would restart Plavix   Bryan Lemma, M.D., M.S. Interventional Cardiologist   Pager # [phone] Phone # [phone] [address] [place], [place] [address]

## 2017-05-03 LAB — CBC
HEMATOCRIT: 43.5 % (ref 39.0–52.0)
HEMOGLOBIN: 14.6 g/dL (ref 13.0–17.0)
MCH: 30.2 pg (ref 26.0–34.0)
MCHC: 33.6 g/dL (ref 30.0–36.0)
MCV: 89.9 fL (ref 78.0–100.0)
PLATELETS: 142 10*3/uL — AB (ref 150–400)
RBC: 4.84 MIL/uL (ref 4.22–5.81)
RDW: 13.5 % (ref 11.5–15.5)
WBC: 6.7 10*3/uL (ref 4.0–10.5)

## 2017-05-03 LAB — BASIC METABOLIC PANEL
Anion gap: 8 (ref 5–15)
BUN: 16 mg/dL (ref 6–20)
CHLORIDE: 102 mmol/L (ref 101–111)
CO2: 27 mmol/L (ref 22–32)
Calcium: 9 mg/dL (ref 8.9–10.3)
Creatinine, Ser: 0.93 mg/dL (ref 0.61–1.24)
GFR calc Af Amer: >60 mL/min (ref >60)
GFR calc non Af Amer: >60 mL/min (ref >60)
GLUCOSE: 119 mg/dL — AB (ref 65–99)
POTASSIUM: 4.1 mmol/L (ref 3.5–5.1)
Sodium: 137 mmol/L (ref 135–145)

## 2017-05-03 LAB — HEPARIN LEVEL (UNFRACTIONATED): Heparin Unfractionated: 0.32 IU/mL (ref 0.30–0.70)

## 2017-05-03 LAB — MAGNESIUM: MAGNESIUM: 2.1 mg/dL (ref 1.7–2.4)

## 2017-05-03 LAB — PLATELET INHIBITION P2Y12: Platelet Function  P2Y12: 160 [PRU] — ABNORMAL LOW (ref 194–418)

## 2017-05-03 MED ORDER — ENALAPRIL MALEATE 2.5 MG PO TABS
2.5000 mg | ORAL_TABLET | Freq: Every day | ORAL | Status: DC
  Administered 2017-05-03 – 2017-05-08 (×6): 2.5 mg via ORAL
  Filled 2017-05-03 (×6): qty 1

## 2017-05-03 NOTE — Progress Notes (Signed)
Pt. found on home CPAP upon h/s, tolerating well.

## 2017-05-03 NOTE — Plan of Care (Signed)
Problem: Activity: Goal: Ability to tolerate increased activity will improve Outcome: Progressing Per cardiac rehab RN note patient ambulated 360ft in hallway today and tolerated well.

## 2017-05-03 NOTE — Progress Notes (Signed)
Patient had 13 beats of wide QRS, asymptomatic.  No BMP ordered for today, K+ was 3.5 yesterday per lab work.  RN noted no recent magnesium lab result.  [facility] paged with this information.

## 2017-05-03 NOTE — Progress Notes (Signed)
CARDIAC REHAB PHASE I   PRE:  Rate/Rhythm: 61 SR  BP:  Sitting: 112/60        SaO2: 96 RA  MODE:  Ambulation: 450 ft   POST:  Rate/Rhythm: 72 bigeminny  BP:  Sitting: 140/97         SaO2: 96 RA  Pt had chest heaviness this morning, per MD note, pt later walked, however pt states he has not walked since yesterday. Pt reports no more symptoms since this morning, eager to walk. Pt ambulated 450 ft on RA, IV, assist x1, mostly steady gait, tolerated well until last 150 ft when pt went into bigeminy, c/o dizziness, and upon return to room, c/o 3/10 chest heaviness, RN notified. VSS. Pt to chair after walk, call bell within reach. MD to advise on pt activity level. Will follow.   [id number] Joylene Grapes, RN, BSN 05/03/2017 2:11 PM

## 2017-05-03 NOTE — Plan of Care (Signed)
Problem: Skin Integrity: Goal: Risk for impaired skin integrity will decrease Outcome: Progressing Patient is free from skin issues; has an area of eczema on upper chest which is being treated with medication.  Problem: Activity: Goal: Ability to return to baseline activity level will improve Outcome: Progressing Patient has been participating in daily ambulation with cardiac rehab. Patient has been up in a chair in his room.   Problem: Education: Goal: Understanding of CV disease, CV risk reduction, and recovery process will improve Outcome: Progressing Patient has education booklet at bedside; has participated in multiple conversations with cardiac rehab, RNs, and MDs about his condition and is able to verbalize knowledge about his cardiovascular health.

## 2017-05-03 NOTE — Progress Notes (Signed)
ANTICOAGULATION CONSULT NOTE - Follow Up Consult  Pharmacy Consult for Heparin Indication: CAD  No Known Allergies  Patient Measurements: Height: 5' 10.5" (179.1 cm) Weight: 188 lb 12.8 oz (85.6 kg) IBW/kg (Calculated) : 74.15 Heparin Dosing Weight:  85.6 kg  Vital Signs: Temp: 98.4 F (36.9 C) (07/26 1124) Temp Source: Oral (07/26 1124) BP: 122/72 (07/26 1124) Pulse Rate: 72 (07/26 1124)  Labs:  Recent Labs  05/01/17 0207 05/01/17 0731 05/02/17 0214 05/03/17 0242 05/03/17 0935  HGB 15.8  --  15.2 14.6  --   HCT 48.5  --  46.3 43.5  --   PLT 148*  --  144* 142*  --   HEPARINUNFRC 0.18* 0.35 0.36 0.32  --   CREATININE 1.03  --  1.02  --  0.93    Estimated Creatinine Clearance: 58.7 mL/min (by C-G formula based on SCr of 0.93 mg/dL).  Assessment:  Anticoag: Lovenox >> Heparin for NSTEMI, PE ruled out, hematuria resolved - HL 0.32. CBC remains WNL but declining slowly.  Goal of Therapy:  Heparin level 0.3-0.7 units/ml Monitor platelets by anticoagulation protocol: Yes   Plan:  Continue heparin gtt at 1200 units/hr Daily heparin level and CBC F/U CVTS consult Plavix washout   Scott [name] S. Merilynn Finland, PharmD, BCPS Clinical Staff Pharmacist Pager [phone]  Misty Stanley Stillinger 05/03/2017,11:42 AM

## 2017-05-03 NOTE — Progress Notes (Addendum)
Progress Note  Patient Name: Scott [name] Date of Encounter: 05/03/2017  Primary Cardiologist: In [place] Dr. Sol Blazing  Subjective   He had an episode of some discomfort in the right side of his chest earlier this morning that he took nitroglycerin for. He said that made him feel little better. He then was able to walk in the hallway today without any symptoms.  No real dyspnea. No sense of palpitations, however on telemetry he did have a brief run of wide-complex tachycardia. Totally asymptomatic.  Still pending CV TS consult  Inpatient Medications    Scheduled Meds: . allopurinol  100 mg Oral Daily  . aspirin EC  81 mg Oral Daily  . atorvastatin  80 mg Oral q1800  . carvedilol  6.25 mg Oral BID WC  . enalapril  2.5 mg Oral QHS  . fluticasone   Topical BID  . levothyroxine  150 mcg Oral QAC breakfast  . multivitamin with minerals  1 tablet Oral Daily  . sodium chloride flush  3 mL Intravenous Q12H  . sodium chloride flush  3 mL Intravenous Q12H   Continuous Infusions: . sodium chloride    . sodium chloride    . heparin 1,200 Units/hr (05/02/17 0600)   PRN Meds: sodium chloride, sodium chloride, acetaminophen, hydrALAZINE, morphine injection, nitroGLYCERIN, ondansetron (ZOFRAN) IV, sodium chloride flush, sodium chloride flush   Vital Signs    Vitals:   05/03/17 0704 05/03/17 0711 05/03/17 0735 05/03/17 1124  BP:  90/62  122/72  Pulse:  74  72  Resp:  17  18  Temp:   98.3 F (36.8 C) 98.4 F (36.9 C)  TempSrc:   Oral Oral  SpO2: 98% 95%  97%  Weight:      Height:        Intake/Output Summary (Last 24 hours) at 05/03/17 1224 Last data filed at 05/03/17 0800  Gross per 24 hour  Intake              684 ml  Output              675 ml  Net                9 ml   Filed Weights   05/01/17 0630 05/02/17 0432 05/03/17 0451  Weight: 187 lb (84.8 kg) 188 lb 8 oz (85.5 kg) 188 lb 12.8 oz (85.6 kg)    Telemetry    Mostly sinus rhythm with PACs and PVCs. Short run  of PVCs / NSVT - Personally Reviewed  ECG    No new - Personally Reviewed  Physical Exam   Physical Exam  Constitutional: He is oriented to person, place, and time. He appears well-developed and well-nourished. No distress.  HENT:  Head: Normocephalic and atraumatic.  Mouth/Throat: No oropharyngeal exudate.  Eyes: Pupils are equal, round, and reactive to light. EOM are normal.  Neck: Normal range of motion. Neck supple.  No carotid bruit. Radiated murmur  Cardiovascular: Normal rate, regular rhythm and intact distal pulses.  Exam reveals no gallop and no friction rub.   Murmur heard.  Harsh crescendo-decrescendo midsystolic murmur is present with a grade of 2/6  at the upper right sternal border, upper left sternal border radiating to the neck Pulmonary/Chest: Effort normal and breath sounds normal. No respiratory distress. He has no wheezes. He has no rales. He exhibits no tenderness.  Abdominal: Soft. Bowel sounds are normal. He exhibits no distension. There is no tenderness. There is no rebound.  Musculoskeletal:  Normal range of motion. He exhibits no edema (Varicose veins noted, no edema).  Neurological: He is alert and oriented to person, place, and time.  Skin: Skin is warm and dry. No rash noted. He is not diaphoretic. No erythema.  Psychiatric: He has a normal mood and affect. His behavior is normal. Judgment and thought content normal.  Nursing note and vitals reviewed.   Labs    Chemistry Recent Labs Lab 04/27/17 0444  05/01/17 0207 05/02/17 0214 05/03/17 0935  NA 141  < > 138 136 137  K 4.1  < > 3.6 3.5 4.1  CL 105  < > 100* 101 102  CO2 22  < > 27 28 27   GLUCOSE 213*  < > 90 108* 119*  BUN 20  < > 14 21* 16  CREATININE 1.15  < > 1.03 1.02 0.93  CALCIUM 8.9  < > 8.9 8.7* 9.0  PROT 7.6  --   --   --   --   ALBUMIN 4.3  --   --   --   --   AST 28  --   --   --   --   ALT 22  --   --   --   --   ALKPHOS 71  --   --   --   --   BILITOT 1.0  --   --   --   --     GFRNONAA 55*  < > >60 >60 >60  GFRAA >60  < > >60 >60 >60  ANIONGAP 14  < > 11 7 8   < > = values in this interval not displayed.   Hematology  Recent Labs Lab 05/01/17 0207 05/02/17 0214 05/03/17 0242  WBC 7.2 6.7 6.7  RBC 5.33 5.15 4.84  HGB 15.8 15.2 14.6  HCT 48.5 46.3 43.5  MCV 91.0 89.9 89.9  MCH 29.6 29.5 30.2  MCHC 32.6 32.8 33.6  RDW 14.0 13.5 13.5  PLT 148* 144* 142*    Cardiac Enzymes  Recent Labs Lab 04/27/17 1512 04/28/17 1151 04/28/17 1641 04/28/17 2317  TROPONINI 37.52* 16.19* 16.39* 10.93*     Recent Labs Lab 04/27/17 0451 04/27/17 1323  TROPIPOC 0.03 28.77*     BNP  Recent Labs Lab 04/27/17 0444  BNP 608.0*     DDimer   Recent Labs Lab 04/27/17 0444  DDIMER 9.17*     Radiology    No results found.  Cardiac Studies   Cardiac cath 04/30/17 (Images personally reviewed) Procedures   Left Heart Cath and Coronary Angiography  Conclusion     Dist RCA lesion, 80 %stenosed.  Ost Cx lesion, 99 %stenosed.  Mid Cx lesion, 99 %stenosed.  Ramus lesion, 70 %stenosed.  Ost LAD to [facility] LAD lesion, 80 %stenosed.     IMPRESSION:Mr Nitta has three-vessel disease with moderate LV dysfunction. His LAD appears subtotally occluded stenosis circumflex at the ostium and midportion. These are calcified vessels. He is a large ramus branch that reaches his apex with moderate proximal disease. He also has 80% distal dominant RCA at the January just prior to the takeoff of the PDA which gives right to left collaterals to the circumflex. I crossed the aortic valve and did not demonstrate a gradient. Intervention of his circumflex would be high risk given the angulation of the takeoff and the fact that it is calcified with a essentially occluded LAD. 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 I'm going to suggest that he  have surgical consultation. The sheath was removed and pressure held in the Cath Lab. We will restart heparin without  bolus in 4 hours. He left the lab in stable condition.   ECHO 04/28/17: Moderate to severely reduced EF. 30-35%. Akinesis of the mid-apical lateral anteroseptal, d anterior and apical wall - consistent with prior LAD infarct. Mild-moderate aortic stenosis (AVA~0.83 cm). Moderate LA and RA dilation. Mild RV dilation but normal function.    Patient Profile     83 y.o. male with PMH CAD s/p MI and stents, obstructive sleep apnea on CPAP, HTN, hypothyroidism who is transferred from [facility] to [facility] for NSTEMI. Pt lives in [place].  Assessment & Plan    1.  Non-STEMI  Troponin 37.52 - with multivessel CAD  Known chronic LAD disease with some recanalization but dense apical scar.  Culprit lesion is most likely related to the circumflex lesions, as the RCA and ramus lesions do not appear to be critical - although they will be the only potential PCI targets.   Unclear with the culprit lesion was with basically chronic LAD disease and likely anterior scar, the circumflex is also very severely diseased and probably not a good PCI target.   Dr. Allyson Sabal recommended surgical consultation - has been seen by the PA, however Dr. Orson Aloe was unfortunately delayed yesterday due to a long operation procedure and anticipates that he should be by today.  Remains off of Plavix. Stopped post cath.  Continue IV heparin for now pending decision. - however if the plan is for medical management, we would stop IV heparin  Continue aspirin, beta blocker and high-dose statin.  We'll add Ranexa today  2.  Chronic systolic heart failure-EF around 30-35% with a previous anteroapical infarction, aortic stenosis appears only mild to moderate.    On ACE inhibitor as well as beta blocker.  He did have a short run of nonsustained VT. Not unexpectedly low EF, will continue beta blocker.   3.  Aortic stenosis mild to mod - could potentially be more significant with low EF,   If we do decide to do CABG, Dr.  Orson Aloe would ask for a preop TEE to allow for planning.  4.  Right bundle branch block 5.  PVCs/NSVT - replete potassium , beta blocker 6. HLD  = high-dose statin   Hypothyroidism: Home dose Synthroid  - Again had another long talk of 60+ minutes with the patient and his wife today. We discussed his symptoms, but then we also discussed potential options going forward: 1. Option #1: + CABG (will defer to Dr. Dorris Fetch if this is the choice but to mention the concept of preop TEE) 2. Option #2: No CABG, FFR guided PCI of Ramus and RCA 3. Option #3: No CABG. Medical management which would include stopping heparin to reassess for symptoms. If symptoms are controlled, would discharge with medical management.   I have left a message for his primary cardiologist in [place] to contact me in order to discuss his prior cardiac evaluation and thoughts going forward.  He is hoping to have a good conversation with the cardiac surgeon in order to determine best course of action either CABG or probably medical management returning home to his primary cardiologist at home to compare current Films with FILMS and decide any PCI would be indicated. - If he does go home without CABG, would restart Plavix  > 60 minutes spent with the patient and his wife. Multiple different issues to discuss as far as  treatment options going forward.>80% of the time was draped patient contact discussing treatment options noted above.   Bryan Lemma, M.D., M.S. Interventional Cardiologist   Pager # [phone] Phone # [phone] [address] [place], [place] [address]

## 2017-05-03 NOTE — Progress Notes (Signed)
Primary team now is cardiology, hospitalist will sign off.

## 2017-05-04 ENCOUNTER — Inpatient Hospital Stay (HOSPITAL_COMMUNITY): Payer: Medicare (Managed Care)

## 2017-05-04 ENCOUNTER — Inpatient Hospital Stay (HOSPITAL_COMMUNITY): Payer: Medicare (Managed Care) | Admitting: Anesthesiology

## 2017-05-04 ENCOUNTER — Encounter (HOSPITAL_COMMUNITY)
Admission: EM | Disposition: A | Discharge status: Home Health | Payer: Self-pay | Source: Home / Self Care | Attending: Cardiovascular Disease

## 2017-05-04 ENCOUNTER — Encounter (HOSPITAL_COMMUNITY): Payer: Self-pay | Admitting: Certified Registered Nurse Anesthetist

## 2017-05-04 DIAGNOSIS — I350 Nonrheumatic aortic (valve) stenosis: Secondary | ICD-10-CM

## 2017-05-04 DIAGNOSIS — I5043 Acute on chronic combined systolic (congestive) and diastolic (congestive) heart failure: Secondary | ICD-10-CM

## 2017-05-04 HISTORY — PX: TEE WITHOUT CARDIOVERSION: SHX5443

## 2017-05-04 LAB — PULMONARY FUNCTION TEST
FEF 25-75 PRE: 1.55 L/s
FEF 25-75 Post: 1.71 L/sec
FEF2575-%CHANGE-POST: 10 %
FEF2575-%PRED-POST: 107 %
FEF2575-%Pred-Pre: 98 %
FEV1-%CHANGE-POST: 2 %
FEV1-%Pred-Post: 78 %
FEV1-%Pred-Pre: 76 %
FEV1-PRE: 1.94 L
FEV1-Post: 1.98 L
FEV1FVC-%CHANGE-POST: 0 %
FEV1FVC-%Pred-Pre: 107 %
FEV6-%CHANGE-POST: 1 %
FEV6-%PRED-PRE: 76 %
FEV6-%Pred-Post: 77 %
FEV6-PRE: 2.58 L
FEV6-Post: 2.63 L
FEV6FVC-%Change-Post: 0 %
FEV6FVC-%PRED-PRE: 108 %
FEV6FVC-%Pred-Post: 108 %
FVC-%CHANGE-POST: 1 %
FVC-%PRED-POST: 71 %
FVC-%Pred-Pre: 70 %
FVC-POST: 2.63 L
FVC-Pre: 2.58 L
POST FEV1/FVC RATIO: 75 %
POST FEV6/FVC RATIO: 100 %
PRE FEV6/FVC RATIO: 100 %
Pre FEV1/FVC ratio: 75 %

## 2017-05-04 LAB — CBC
HCT: 42.8 % (ref 39.0–52.0)
HEMOGLOBIN: 14.1 g/dL (ref 13.0–17.0)
MCH: 29.7 pg (ref 26.0–34.0)
MCHC: 32.9 g/dL (ref 30.0–36.0)
MCV: 90.1 fL (ref 78.0–100.0)
Platelets: 140 10*3/uL — ABNORMAL LOW (ref 150–400)
RBC: 4.75 MIL/uL (ref 4.22–5.81)
RDW: 13.6 % (ref 11.5–15.5)
WBC: 7 10*3/uL (ref 4.0–10.5)

## 2017-05-04 LAB — ECHO TEE
LDCA: 3.14 cm2
LVOT diameter: 20 mm
MRPISAEROA: 0.11 cm2
VTI: 182 cm

## 2017-05-04 LAB — BASIC METABOLIC PANEL
ANION GAP: 5 (ref 5–15)
BUN: 16 mg/dL (ref 6–20)
CO2: 27 mmol/L (ref 22–32)
Calcium: 8.5 mg/dL — ABNORMAL LOW (ref 8.9–10.3)
Chloride: 106 mmol/L (ref 101–111)
Creatinine, Ser: 0.97 mg/dL (ref 0.61–1.24)
GLUCOSE: 100 mg/dL — AB (ref 65–99)
POTASSIUM: 3.8 mmol/L (ref 3.5–5.1)
Sodium: 138 mmol/L (ref 135–145)

## 2017-05-04 LAB — HEPARIN LEVEL (UNFRACTIONATED): HEPARIN UNFRACTIONATED: 0.4 [IU]/mL (ref 0.30–0.70)

## 2017-05-04 SURGERY — ECHOCARDIOGRAM, TRANSESOPHAGEAL
Anesthesia: Monitor Anesthesia Care

## 2017-05-04 MED ORDER — FUROSEMIDE 20 MG PO TABS
20.0000 mg | ORAL_TABLET | Freq: Every day | ORAL | Status: DC
  Administered 2017-05-05 – 2017-05-08 (×4): 20 mg via ORAL
  Filled 2017-05-04 (×4): qty 1

## 2017-05-04 MED ORDER — BUTAMBEN-TETRACAINE-BENZOCAINE 2-2-14 % EX AERO
INHALATION_SPRAY | CUTANEOUS | Status: DC | PRN
  Administered 2017-05-04: 2 via TOPICAL

## 2017-05-04 MED ORDER — ALBUTEROL SULFATE (2.5 MG/3ML) 0.083% IN NEBU
2.5000 mg | INHALATION_SOLUTION | Freq: Once | RESPIRATORY_TRACT | Status: AC
  Administered 2017-05-04: 2.5 mg via RESPIRATORY_TRACT

## 2017-05-04 MED ORDER — SODIUM CHLORIDE 0.9 % IV SOLN: INTRAVENOUS | Status: DC

## 2017-05-04 MED ORDER — ISOSORBIDE MONONITRATE ER 30 MG PO TB24
30.0000 mg | ORAL_TABLET | Freq: Every day | ORAL | Status: DC
  Administered 2017-05-04 – 2017-05-08 (×5): 30 mg via ORAL
  Filled 2017-05-04 (×5): qty 1

## 2017-05-04 MED ORDER — PROPOFOL 500 MG/50ML IV EMUL
INTRAVENOUS | Status: DC | PRN
  Administered 2017-05-04: 100 ug/kg/min via INTRAVENOUS

## 2017-05-04 MED ORDER — RANOLAZINE ER 500 MG PO TB12
500.0000 mg | ORAL_TABLET | Freq: Two times a day (BID) | ORAL | Status: DC
  Administered 2017-05-04 – 2017-05-08 (×9): 500 mg via ORAL
  Filled 2017-05-04 (×9): qty 1

## 2017-05-04 MED ORDER — PHENYLEPHRINE 40 MCG/ML (10ML) SYRINGE FOR IV PUSH (FOR BLOOD PRESSURE SUPPORT)
PREFILLED_SYRINGE | INTRAVENOUS | Status: DC | PRN
  Administered 2017-05-04: 120 ug via INTRAVENOUS
  Administered 2017-05-04: 40 ug via INTRAVENOUS
  Administered 2017-05-04 (×2): 120 ug via INTRAVENOUS

## 2017-05-04 MED ORDER — LACTATED RINGERS IV SOLN
INTRAVENOUS | Status: DC | PRN
  Administered 2017-05-04: 14:00:00 via INTRAVENOUS

## 2017-05-04 NOTE — Plan of Care (Signed)
Problem: Activity: Goal: Ability to tolerate increased activity will improve Outcome: Progressing Pt ambulating in hall with Cardiac rehab without Chest pressure

## 2017-05-04 NOTE — Care Management Note (Addendum)
Case Management Note  Patient Details  Name: Scott [name] MRN: [id number] Date of Birth: 12-Jan-1927  Subjective/Objective:  Pt presented for Hypertension/ Hypoxia. Initiated on BIPAP- ruled in for Nstemi-on IV Heparin gtt. Cardiac Cath 04-30-17 revealed 3 vessel CAD- CVTS consulted. Plan for Palvix Washout and CABG 05-09-17. PTA Pt was traveling from [place].              Action/Plan: CM will continue to monitor for additional home needs.   Expected Discharge Date:                  Expected Discharge Plan:  Home w Home Health Services  In-House Referral:     Discharge planning Services  CM Consult  Post Acute Care Choice:    Choice offered to:     DME Arranged:    DME Agency:     HH Arranged:    HH Agency:     Status of Service:  In process, will continue to follow  If discussed at Long Length of Stay Meetings, dates discussed:  05-08-17  Additional Comments: [address] Tomi Bamberger, RN,BSN [phone] Plan for CABG 05-09-17. Wife at bedside. CM will continue to monitor for additional needs post procedure.  Gala Lewandowsky, RN 05/04/2017, 4:10 PM

## 2017-05-04 NOTE — Progress Notes (Signed)
ANTICOAGULATION CONSULT NOTE - Follow Up Consult  Pharmacy Consult for heparin Indication: NSTEMI  No Known Allergies  Patient Measurements: Height: 5' 10.5" (179.1 cm) Weight: 190 lb 4.8 oz (86.3 kg) IBW/kg (Calculated) : 74.15 Heparin Dosing Weight:85.6 kg  Vital Signs: Temp: 98 F (36.7 C) (07/27 0744) Temp Source: Oral (07/27 0744) BP: 105/44 (07/27 0744) Pulse Rate: 54 (07/27 0744)  Labs:  Recent Labs  05/02/17 0214 05/03/17 0242 05/03/17 0935 05/04/17 0403  HGB 15.2 14.6  --  14.1  HCT 46.3 43.5  --  42.8  PLT 144* 142*  --  140*  HEPARINUNFRC 0.36 0.32  --  0.40  CREATININE 1.02  --  0.93 0.97    Estimated Creatinine Clearance: 56.3 mL/min (by C-G formula based on SCr of 0.97 mg/dL).   Medications:  Infusions:  . sodium chloride    . sodium chloride    . heparin 1,200 Units/hr (05/03/17 1548)    Assessment: 68 YOM presenting with NSTEMI, PE ruled out, and pharmacy consult for heparin pending CABG decision from cards.    Heparin level therapeutic: 0.40  Goal of Therapy:  Heparin level 0.3-0.7 units/ml Monitor platelets by anticoagulation protocol: Yes   Plan:  Continue heparin gtt at 1200 units/hr Daily heparin level and CBC F/U CVTS consult Plavix washout  Daylene Posey, PharmD Pharmacy Resident Pager #: [phone] 05/04/2017 10:13 AM

## 2017-05-04 NOTE — Progress Notes (Signed)
Progress Note  Patient Name: Scott [name] Date of Encounter: 05/04/2017  Primary Cardiologist: In [place] Dr. Sol Blazing  Subjective   Seen by CT surgery. TEE ordered for today to evaluate aortic valve, and vein mapping also ordered.  Initial recommendation would be to consider medical therapy with surgical option as a backup if necessary. Also consider PCI.  Inpatient Medications    Scheduled Meds: . allopurinol  100 mg Oral Daily  . aspirin EC  81 mg Oral Daily  . atorvastatin  80 mg Oral q1800  . carvedilol  6.25 mg Oral BID WC  . enalapril  2.5 mg Oral QHS  . fluticasone   Topical BID  . levothyroxine  150 mcg Oral QAC breakfast  . multivitamin with minerals  1 tablet Oral Daily  . sodium chloride flush  3 mL Intravenous Q12H  . sodium chloride flush  3 mL Intravenous Q12H   Continuous Infusions: . sodium chloride    . sodium chloride    . sodium chloride     PRN Meds: sodium chloride, sodium chloride, acetaminophen, hydrALAZINE, morphine injection, nitroGLYCERIN, ondansetron (ZOFRAN) IV, sodium chloride flush, sodium chloride flush   Vital Signs    Vitals:   05/04/17 0316 05/04/17 0507 05/04/17 0744 05/04/17 1210  BP: 106/71 108/69 (!) 105/44 102/65  Pulse: (!) 58 (!) 58 (!) 54 (!) 57  Resp: 16 17 19 19   Temp:  97.7 F (36.5 C) 98 F (36.7 C) 97.8 F (36.6 C)  TempSrc:  Oral Oral Oral  SpO2: 97% 98% 96% 99%  Weight:  190 lb 4.8 oz (86.3 kg)    Height:        Intake/Output Summary (Last 24 hours) at 05/04/17 1237 Last data filed at 05/04/17 0900  Gross per 24 hour  Intake              936 ml  Output              900 ml  Net               36 ml   Filed Weights   05/02/17 0432 05/03/17 0451 05/04/17 0507  Weight: 188 lb 8 oz (85.5 kg) 188 lb 12.8 oz (85.6 kg) 190 lb 4.8 oz (86.3 kg)    Telemetry    Mostly sinus rhythm with PACs and PVCs.- Personally Reviewed  ECG    No new - Personally Reviewed  Physical Exam   Physical Exam  Constitutional:  He is oriented to person, place, and time. He appears well-developed and well-nourished. No distress.  HENT:  Head: Normocephalic and atraumatic.  Mouth/Throat: No oropharyngeal exudate.  Eyes: Pupils are equal, round, and reactive to light. EOM are normal.  Neck: Normal range of motion. Neck supple. No JVD present.  No carotid bruit. Radiated murmur  Cardiovascular: Normal rate, regular rhythm and intact distal pulses.  Exam reveals no gallop and no friction rub.   Murmur heard.  Harsh crescendo-decrescendo midsystolic murmur is present with a grade of 2/6  at the upper right sternal border, upper left sternal border radiating to the neck Pulmonary/Chest: Effort normal and breath sounds normal. No respiratory distress. He has no wheezes. He has no rales. He exhibits no tenderness.  Abdominal: Soft. Bowel sounds are normal. He exhibits no distension. There is no tenderness. There is no rebound.  Musculoskeletal: Normal range of motion. He exhibits no edema (Varicose veins noted, no edema).  Neurological: He is alert and oriented to person, place, and time.  Skin: Skin is warm and dry. No rash noted. He is not diaphoretic. No erythema.  Psychiatric: He has a normal mood and affect. His behavior is normal. Judgment and thought content normal.  Nursing note and vitals reviewed.   Labs    Chemistry  Recent Labs Lab 05/02/17 0214 05/03/17 0935 05/04/17 0403  NA 136 137 138  K 3.5 4.1 3.8  CL 101 102 106  CO2 28 27 27   GLUCOSE 108* 119* 100*  BUN 21* 16 16  CREATININE 1.02 0.93 0.97  CALCIUM 8.7* 9.0 8.5*  GFRNONAA >60 >60 >60  GFRAA >60 >60 >60  ANIONGAP 7 8 5      Hematology  Recent Labs Lab 05/02/17 0214 05/03/17 0242 05/04/17 0403  WBC 6.7 6.7 7.0  RBC 5.15 4.84 4.75  HGB 15.2 14.6 14.1  HCT 46.3 43.5 42.8  MCV 89.9 89.9 90.1  MCH 29.5 30.2 29.7  MCHC 32.8 33.6 32.9  RDW 13.5 13.5 13.6  PLT 144* 142* 140*    Cardiac Enzymes  Recent Labs Lab 04/27/17 1512  04/28/17 1151 04/28/17 1641 04/28/17 2317  TROPONINI 37.52* 16.19* 16.39* 10.93*     Recent Labs Lab 04/27/17 1323  TROPIPOC 28.77*     BNP No results for input(s): BNP, PROBNP in the last 168 hours.   DDimer  No results for input(s): DDIMER in the last 168 hours.   Radiology    No results found.  Cardiac Studies   Cardiac cath 04/30/17 (Images personally reviewed) Procedures   Left Heart Cath and Coronary Angiography  Conclusion     Dist RCA lesion, 80 %stenosed.  Ost Cx lesion, 99 %stenosed.  Mid Cx lesion, 99 %stenosed.  Ramus lesion, 70 %stenosed.  Ost LAD to [facility] LAD lesion, 80 %stenosed.     IMPRESSION:Mr Floor has three-vessel disease with moderate LV dysfunction. His LAD appears subtotally occluded stenosis circumflex at the ostium and midportion. These are calcified vessels. He is a large ramus branch that reaches his apex with moderate proximal disease. He also has 80% distal dominant RCA at the January just prior to the takeoff of the PDA which gives right to left collaterals to the circumflex. I crossed the aortic valve and did not demonstrate a gradient. Intervention of his circumflex would be high risk given the angulation of the takeoff and the fact that it is calcified with a essentially occluded LAD. 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 I'm going to suggest that he have surgical consultation. The sheath was removed and pressure held in the Cath Lab. We will restart heparin without bolus in 4 hours. He left the lab in stable condition.   ECHO 04/28/17: Moderate to severely reduced EF. 30-35%. Akinesis of the mid-apical lateral anteroseptal, d anterior and apical wall - consistent with prior LAD infarct. Mild-moderate aortic stenosis (AVA~0.83 cm). Moderate LA and RA dilation. Mild RV dilation but normal function.   Vein mapping performed. Reviewed by Dr. Dorris Fetch PFTs performed. Formal read  TEE pending today   Patient Profile       83 y.o. male with PMH CAD s/p MI and stents, obstructive sleep apnea on CPAP, HTN, hypothyroidism who is transferred from [facility] to [facility] for NSTEMI. Pt lives in [place].  Assessment & Plan    Principal Problem:   Acute respiratory failure with hypoxia (HCC) - respiratory failure has resolved now. Likely related to non-STEMI with acute on chronic combined systolic and diastolic heart failure.  Symptoms essentially resolved, no longer  on oxygen   Treating CAD and CHF as below.  NSTEMI (non-ST elevated myocardial infarction) (HCC)/multivessel Coronary artery disease involving native coronary artery of native heart with unstable angina pectoris (HCC)  Plavix on hold for potential bypass surgery, would not have many good conduits for grafting. Dr. Orson Aloe indicated the possibility of a hybrid procedure, but indicates that he would probably be high risk for surgery.  I have reviewed the images independently myself and with Dr. [place] as well as Dr. Excell Seltzer. Both of [place] feel like the lesions in the RCA and ramus intermedius are not clearly flow-limiting and could be treated medically. The circumflex itself would be very difficult to approach from an interventional standpoint and would be the one vessel that would benefit from bypassing. The LAD is likely to severely diseased area that is probably not viable on akinesis on echocardiogram and large scar noted on cath.   The more I think about it, I would prefer to know for sure that we are going to benefit him from revascularization either via PCI or CABG.  This may be best done via relook catheterization with FFR.    For now, my recommendation is to discontinue heparin. Ambulate him in the hallway today and tomorrow evaluate for recurrent symptoms of his anginal equivalent. He has had some of his back pain and also some chest pressure that are both nitroglycerin responsive - but at rest.  I am not sure if these are true angina symptoms or  not.  If we are unable to get him symptom-free over weekend, it would be prudent to consider relook cath with FFR in order to guide our management. If indeed the RCA in the ramus are physiologically significant, then I think bypass surgery would potentially be his best option, however if not, potentially lesion specific intervention will be better for him.  Options are as follows: 1. Option #1: + CABG (may still want to confirm RCA & Ramus lesions are significant) 2. Option #2: No CABG, FFR guided PCI of Ramus and RCA &attempted Cx PCI 3. Option #3: No CABG. Medical management 1st & reassess (which could be in short OP f/u since they plan to stay in [place] for a short while until he is more stable for travel). If symptoms are controlled, would pursue medical management.      History of MI (myocardial infarction) -looks like anterior infarct by catheterization and echo - /  Ischemic dilated cardiomyopathy (HCC)  Does not seem to be having active heart failure symptoms, and has had known standing cardiomyopathy.   Seems euvolemic.  On combination of ACE inhibitor and beta blocker  Will add low-dose diuretic given elevated LVEDP.     Hyperlipidemia with target low density lipoprotein (LDL) cholesterol less than 70 mg/dL   Essential hypertension   Atrial flutter by electrocardiogram [facility])   Aortic stenosis, mild-moderate - TEE today to evaluate possibility of low output more severe yes.   Hypothyroidism - on home medications    Bryan Lemma, M.D., M.S. Interventional Cardiologist   Pager # [phone] Phone # [phone] [address] [place], [place] [address]

## 2017-05-04 NOTE — Interval H&P Note (Signed)
History and Physical Interval Note:  05/04/2017 2:09 PM  Scott [name]  has presented today for surgery, with the diagnosis of assess aortic valve  The various methods of treatment have been discussed with the patient and family. After consideration of risks, benefits and other options for treatment, the patient has consented to  Procedure(s): TRANSESOPHAGEAL ECHOCARDIOGRAM (TEE) (N/A) as a surgical intervention .  The patient's history has been reviewed, patient examined, no change in status, stable for surgery.  I have reviewed the patient's chart and labs.  Questions were answered to the patient's satisfaction.     [name]

## 2017-05-04 NOTE — Progress Notes (Signed)
Pt refused cpap. Educated pt to call if needed. Vitals stable at this time. RT will cont to monitor.

## 2017-05-04 NOTE — CV Procedure (Signed)
    PROCEDURE NOTE:  Procedure:  Transesophageal echocardiogram Operator:  [name], MD Indications:  Aortic stenosis - preop for CABG Complications: None  During this procedure the patient is administered a total of Propofol 116 mg to achieve and maintain  sedation.  The patient's heart rate, blood pressure, and oxygen saturation are monitored continuously during the procedure by anesthesia.    Results: Mildly dilated LV with moderate to severe LV dysfunction Mildly dilated RV size with normal RV function Moderately  RA Normal LA and LA appendage with no thrombus noted Normal TV Normal PV with trivial PR Normal MV with mild to moderate central MR.  ERO 0.11cm2 by PISA. Trileaflet AV that is thickened and calcified.  Severe aortic eccentric annular calcification.  There is mild AR.  The valve leaflets appear to move well.  Visually appears at most to be be mild AS.   Normal interatrial septum with no evidence of shunt by colorflow dopper  Normal thoracic and ascending aorta.  The patient tolerated the procedure well and was transferred back to their room in stable condition.  Signed: [name], MD [facility]

## 2017-05-04 NOTE — Progress Notes (Addendum)
CARDIAC REHAB PHASE I   PRE:  Rate/Rhythm: 60 SR    BP: sitting 102/65    SaO2: 93 RA  MODE:  Ambulation: 220 ft   POST:  Rate/Rhythm: 70 SR with many PVCs    BP: sitting 137/92     SaO2: 100 RA  Pt walked right after heparin turned off. Sts he has 1/10 back burning "since I've been here". Denies chest heaviness like he had walking yesterday. Steady walking except slight stagger on turn. Many PVCs. After 150 ft pt sts his head feels "tight", like it started yesterday when walking. Pt returned to room (before sx got worse). Sts his head tightness resolved after a few minutes. Denied chest heaviness. Sts his back burning is "fine" and maybe better.  [id number]  Harriet Masson CES, ACSM 05/04/2017 12:29 PM

## 2017-05-04 NOTE — Progress Notes (Signed)
      [address]       [place] [address]             [phone]      Recently back from TEE. Is mildly confused at present No Cp today  BP 123/67 (BP Location: Right Arm)   Pulse (!) 57   Temp 98 F (36.7 C) (Oral)   Resp 19   Ht 5' 10.5" (1.791 m)   Wt 190 lb 4.8 oz (86.3 kg)   SpO2 96%   BMI 26.92 kg/m   Intake/Output Summary (Last 24 hours) at 05/04/17 1638 Last data filed at 05/04/17 1450  Gross per 24 hour  Intake             1260 ml  Output              700 ml  Net              560 ml   Discussed TEE and reviewed films with Dr. Mayford Knife. The noncoronary cusp is relatively fixed but the other 2 cusps move freely. As is mild at worst. Will not need AVR at this time.  Awaiting vein mapping results. (Not optimistic in regards to this study)  Plan as outlined in detail in Dr. Elissa Hefty note. Will try medical therapy off iv heparin and see how he does. If he continues to have unstable symptoms will need CABG next week. He is on the schedule for Wednesday as of now.  Salvatore Decent Dorris Fetch, MD [facility] [phone]

## 2017-05-04 NOTE — Anesthesia Preprocedure Evaluation (Addendum)
Anesthesia Evaluation  Patient identified by MRN, date of birth, ID band Patient awake    Reviewed: Allergy & Precautions, NPO status , Patient's Chart, lab work & pertinent test results  Airway Mallampati: II  TM Distance: >3 FB Neck ROM: Full    Dental  (+) Teeth Intact, Dental Advisory Given   Pulmonary    Pulmonary exam normal        Cardiovascular hypertension, + angina + CAD, + Past MI, + Peripheral Vascular Disease and +CHF  Normal cardiovascular exam+ Valvular Problems/Murmurs AS  Rhythm:Regular Rate:Normal     Neuro/Psych    GI/Hepatic   Endo/Other  Hypothyroidism   Renal/GU      Musculoskeletal   Abdominal   Peds  Hematology   Anesthesia Other Findings 7/18 ECHO  Left ventricle:  Poor acoustic windows llimit study LVEF is severely depressed at approximately 25 to 30%  There is diffuse hypokinesis; inferior and apical akinesis Would recomm limited echo with Definity to further define wall motion The cavity size was severely dilated. Wall thickness was normal.   Reproductive/Obstetrics                           Anesthesia Physical Anesthesia Plan  ASA: IV  Anesthesia Plan: MAC   Post-op Pain Management:    Induction: Intravenous  PONV Risk Score and Plan:   Airway Management Planned: Natural Airway and Nasal Cannula  Additional Equipment:   Intra-op Plan:   Post-operative Plan:   Informed Consent: I have reviewed the patients History and Physical, chart, labs and discussed the procedure including the risks, benefits and alternatives for the proposed anesthesia with the patient or authorized representative who has indicated his/her understanding and acceptance.   Dental advisory given  Plan Discussed with: CRNA and Anesthesiologist  Anesthesia Plan Comments:         Anesthesia Quick Evaluation

## 2017-05-04 NOTE — Anesthesia Procedure Notes (Signed)
Procedure Name: MAC Date/Time: 05/04/2017 2:33 PM Performed by: Garrison Columbus T Pre-anesthesia Checklist: Patient identified, Emergency Drugs available and Patient being monitored Patient Re-evaluated:Patient Re-evaluated prior to induction Oxygen Delivery Method: Nasal cannula Preoxygenation: Pre-oxygenation with 100% oxygen Induction Type: IV induction Placement Confirmation: positive ETCO2 and breath sounds checked- equal and bilateral Dental Injury: Teeth and Oropharynx as per pre-operative assessment

## 2017-05-04 NOTE — Progress Notes (Signed)
  Echocardiogram Echocardiogram Transesophageal has been performed.  Leta Jungling M 05/04/2017, 3:08 PM

## 2017-05-04 NOTE — Transfer of Care (Signed)
Immediate Anesthesia Transfer of Care Note  Patient: Scott [name]  Procedure(s) Performed: Procedure(s): TRANSESOPHAGEAL ECHOCARDIOGRAM (TEE) (N/A)  Patient Location: Endoscopy Unit  Anesthesia Type:MAC  Level of Consciousness: awake, alert  and oriented  Airway & Oxygen Therapy: Patient Spontanous Breathing and Patient connected to nasal cannula oxygen  Post-op Assessment: Report given to RN, Post -op Vital signs reviewed and stable and Patient moving all extremities X 4  Post vital signs: Reviewed and stable  Last Vitals:  Vitals:   05/04/17 0744 05/04/17 1210  BP: (!) 105/44 102/65  Pulse: (!) 54 (!) 57  Resp: 19 19  Temp: 36.7 C 36.6 C    Last Pain:  Vitals:   05/04/17 1210  TempSrc: Oral  PainSc:       Patients Stated Pain Goal: 0 (62/13/08 6578)  Complications: No apparent anesthesia complications

## 2017-05-04 NOTE — Care Management Important Message (Signed)
Important Message  Patient Details  Name: Scott [name] MRN: [id number] Date of Birth: 07-Aug-1927   Medicare Important Message Given:  Yes    Cy Bresee Stefan Church 05/04/2017, 2:44 PM

## 2017-05-05 ENCOUNTER — Inpatient Hospital Stay (HOSPITAL_COMMUNITY): Payer: Medicare (Managed Care)

## 2017-05-05 DIAGNOSIS — Z01810 Encounter for preprocedural cardiovascular examination: Secondary | ICD-10-CM

## 2017-05-05 LAB — CBC
HCT: 41.1 % (ref 39.0–52.0)
Hemoglobin: 13.4 g/dL (ref 13.0–17.0)
MCH: 29.5 pg (ref 26.0–34.0)
MCHC: 32.6 g/dL (ref 30.0–36.0)
MCV: 90.5 fL (ref 78.0–100.0)
PLATELETS: 153 10*3/uL (ref 150–400)
RBC: 4.54 MIL/uL (ref 4.22–5.81)
RDW: 13.7 % (ref 11.5–15.5)
WBC: 7.7 10*3/uL (ref 4.0–10.5)

## 2017-05-05 MED ORDER — LORAZEPAM 0.5 MG PO TABS
0.5000 mg | ORAL_TABLET | Freq: Once | ORAL | Status: AC
  Administered 2017-05-05: 0.5 mg via ORAL
  Filled 2017-05-05: qty 1

## 2017-05-05 NOTE — Progress Notes (Signed)
Pre-op Cardiac Surgery  Carotid Findings:  Bilateral:  1-39% ICA stenosis.  Vertebral artery flow is antegrade.     Upper Extremity Right Left  Brachial Pressures 132 Triphasic 124 Triphasic  Radial Waveforms Triphasic Triphasic  Ulnar Waveforms Triphasic Triphasic  Palmar Arch (Allen's Test) Normal Normal   Findings:  Doppler waveforms remained normal bilaterally with both redial and ulnar compressions. normal    Lower  Extremity Right Left  Dorsalis Pedis 91 Biphasic 75 Monophasic  Posterior Tibial 110 Biphasic 106 Biphasic  Ankle/Brachial Indices 0.83 0.80    Findings:  ABIs and Doppler waveforms indicate a mild reduction in arterial flow bilaterally at rest.  Toma Deiters, RVS  7.28.2018 2:38 PM

## 2017-05-05 NOTE — Progress Notes (Signed)
Right Lower Extremity Vein Map    Right Great Saphenous Vein   Segment Diameter Comment  1. Origin 5.78mm Multiple branches versus trotuosity  2. High Thigh 3.79mm   3. Mid Thigh 1.42mm   4. Low Thigh 2.22mm   5. At Knee 2.59mm   6. High Calf 2.58mm   7. Low Calf 63mm   8. Ankle 39mm     Left Lower Extremity Vein Map    Left Great Saphenous Vein   Segment Diameter Comment  1. Origin mm Thrombosed previous strippimg  2. High Thigh mm Thrombosed previous stripping  3. Mid Thigh mm Thrombosed previous stripping  4. Low Thigh mm Thrombosed previous stripping  5. At Knee 1.77mm   6. High Calf 2.32mm   7. Low Calf 2.50mm Patent compressible. Unable to fully identify the vein due to branches versus tortuosity. Ranged from 3.35mm to 1.9 mm    8. Ankle 3.42mm Multiple branches    Technically difficult bilaterally due to previous procedures. Tortuous  And multiple branches    Woodrow Dulski, RVS 05/05/2017, 2:12 PM

## 2017-05-05 NOTE — Progress Notes (Signed)
Progress Note  Patient Name: Scott [name] Date of Encounter: 05/05/2017  Primary Cardiologist: Scott [name] Scott [name]  Subjective   No chest pain off heparin   Inpatient Medications    Scheduled Meds: . allopurinol  100 mg Oral Daily  . aspirin EC  81 mg Oral Daily  . atorvastatin  80 mg Oral q1800  . carvedilol  6.25 mg Oral BID WC  . enalapril  2.5 mg Oral QHS  . fluticasone   Topical BID  . furosemide  20 mg Oral Daily  . isosorbide mononitrate  30 mg Oral QHS  . levothyroxine  150 mcg Oral QAC breakfast  . multivitamin with minerals  1 tablet Oral Daily  . ranolazine  500 mg Oral BID  . sodium chloride flush  3 mL Intravenous Q12H  . sodium chloride flush  3 mL Intravenous Q12H   Continuous Infusions: . sodium chloride    . sodium chloride     PRN Meds: sodium chloride, sodium chloride, acetaminophen, hydrALAZINE, morphine injection, nitroGLYCERIN, ondansetron (ZOFRAN) IV, sodium chloride flush, sodium chloride flush   Vital Signs    Vitals:   05/05/17 0418 05/05/17 0636 05/05/17 0721 05/05/17 0738  BP: (!) 101/52   (!) 98/52  Pulse: (!) 51 (!) 52 (!) 53 (!) 51  Resp: 19 20 20 13   Temp: 97.7 F (36.5 C)   98.4 F (36.9 C)  TempSrc: Oral   Oral  SpO2: 96% 93% 98% 100%  Weight: 189 lb (85.7 kg)     Height:        Intake/Output Summary (Last 24 hours) at 05/05/17 0855 Last data filed at 05/04/17 2346  Gross per 24 hour  Intake             1020 ml  Output              350 ml  Net              670 ml   Filed Weights   05/03/17 0451 05/04/17 0507 05/05/17 0418  Weight: 188 lb 12.8 oz (85.6 kg) 190 lb 4.8 oz (86.3 kg) 189 lb (85.7 kg)    Telemetry    Mostly sinus rhythm with PACs and PVCs. Short run of PVCs / NSVT - Personally Reviewed  ECG    No new - Personally Reviewed  Physical Exam   Physical Exam  Constitutional: He is oriented to person, place, and time. He appears well-developed and well-nourished. No distress.  HENT:  Head:  Normocephalic and atraumatic.  Mouth/Throat: No oropharyngeal exudate.  Eyes: Pupils are equal, round, and reactive to light. EOM are normal.  Neck: Normal range of motion. Neck supple.  No carotid bruit. Radiated murmur  Cardiovascular: Normal rate, regular rhythm and intact distal pulses.  Exam reveals no gallop and no friction rub.   Murmur heard.  Harsh crescendo-decrescendo midsystolic murmur is present with a grade of 2/6  at the upper right sternal border, upper left sternal border radiating to the neck Pulmonary/Chest: Effort normal and breath sounds normal. No respiratory distress. He has no wheezes. He has no rales. He exhibits no tenderness.  Abdominal: Soft. Bowel sounds are normal. He exhibits no distension. There is no tenderness. There is no rebound.  Musculoskeletal: Normal range of motion. He exhibits no edema (Varicose veins noted, no edema).  Neurological: He is alert and oriented to person, place, and time.  Skin: Skin is warm and dry. No rash noted. He is not diaphoretic. No erythema.  Psychiatric: He has a normal mood and affect. His behavior is normal. Judgment and thought content normal.  Nursing note and vitals reviewed.   Labs    Chemistry  Recent Labs Lab 05/02/17 0214 05/03/17 0935 05/04/17 0403  NA 136 137 138  K 3.5 4.1 3.8  CL 101 102 106  CO2 28 27 27   GLUCOSE 108* 119* 100*  BUN 21* 16 16  CREATININE 1.02 0.93 0.97  CALCIUM 8.7* 9.0 8.5*  GFRNONAA >60 >60 >60  GFRAA >60 >60 >60  ANIONGAP 7 8 5      Hematology  Recent Labs Lab 05/03/17 0242 05/04/17 0403 05/05/17 0232  WBC 6.7 7.0 7.7  RBC 4.84 4.75 4.54  HGB 14.6 14.1 13.4  HCT 43.5 42.8 41.1  MCV 89.9 90.1 90.5  MCH 30.2 29.7 29.5  MCHC 33.6 32.9 32.6  RDW 13.5 13.6 13.7  PLT 142* 140* 153    Cardiac Enzymes  Recent Labs Lab 04/28/17 1151 04/28/17 1641 04/28/17 2317  TROPONINI 16.19* 16.39* 10.93*    No results for input(s): TROPIPOC Scott the last 168 hours.   BNP No  results for input(s): BNP, PROBNP Scott the last 168 hours.   DDimer  No results for input(s): DDIMER Scott the last 168 hours.   Radiology    No results found.  Cardiac Studies   Cardiac cath 04/30/17 (Images personally reviewed) Procedures   Left Heart Cath and Coronary Angiography  Conclusion     Dist RCA lesion, 80 %stenosed.  Ost Cx lesion, 99 %stenosed.  Mid Cx lesion, 99 %stenosed.  Ramus lesion, 70 %stenosed.  Ost LAD to Scott [name] LAD lesion, 80 %stenosed.     IMPRESSION:Scott [name] has three-vessel disease with moderate LV dysfunction. His LAD appears subtotally occluded stenosis circumflex at the ostium and midportion. These are calcified vessels. He is a large ramus branch that reaches his apex with moderate proximal disease. He also has 80% distal dominant RCA at the January just prior to the takeoff of the PDA which gives right to left collaterals to the circumflex. I crossed the aortic valve and did not demonstrate a gradient. Intervention of his circumflex would be high risk given the angulation of the takeoff and the fact that it is calcified with a essentially occluded LAD. 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 I'm going to suggest that he have surgical consultation. The sheath was removed and pressure held Scott the Cath Lab. We will restart heparin without bolus Scott 4 hours. He left the lab Scott stable condition.   ECHO 04/28/17: Moderate to severely reduced EF. 30-35%. Akinesis of the mid-apical lateral anteroseptal, d anterior and apical wall - consistent with prior LAD infarct. Mild-moderate aortic stenosis (AVA~0.83 cm). Moderate LA and RA dilation. Mild RV dilation but normal function.    Patient Profile     83 y.o. male with PMH CAD s/p MI and stents, obstructive sleep apnea on CPAP, HTN, hypothyroidism who is transferred from [name] to Scott [name] for NSTEMI. Pt lives Scott [name].  Assessment & Plan    1.  Non-STEMI  Troponin 37.52 - with multivessel  CAD  Known chronic LAD disease with some recanalization but dense apical scar.  Culprit lesion is most likely related to the circumflex lesions, as the RCA and ramus lesions do not appear to be critical - although they will be the only potential PCI targets.   Unclear with the culprit lesion was with basically chronic LAD disease and likely anterior scar, the  circumflex is also very severely diseased and probably not a good PCI target.   Seen by Dr Dorris Fetch and tentatively on for CABG [facility]   2.  Chronic systolic heart failure-EF around 30-35% with a previous anteroapical infarction, aortic stenosis appears only mild to moderate.    On ACE inhibitor as well as beta blocker.  He did have a short run of nonsustained VT. Not unexpectedly low EF, will continue beta blocker.  3.  Aortic stenosis mild to mod -  TEE with no indication for AVR especially given age   46.  Right bundle branch block no high grade AV block   5.  PVCs/NSVT - replete potassium , beta blocker  6. HLD  = high-dose statin    Charlton Haws, MD

## 2017-05-05 NOTE — Plan of Care (Signed)
Problem: Safety: Goal: Ability to remain free from injury will improve Outcome: Progressing No injuries, falls or skin break down this shift. Fall risk bundle in place. Pt turns self in bed and ambulated once this shift.

## 2017-05-05 NOTE — Progress Notes (Signed)
Patient ambulated six laps around the nurses' station. Patient w/ steady gait and no complaints of any pain during walk. Some ventricular bigeminy noted on telemetry during walk.

## 2017-05-05 NOTE — Progress Notes (Signed)
This note also relates to the following rows which could not be included: Resp - Cannot attach notes to unvalidated device data  Pt has home machine and wife state that she can place on pt when he is ready.  Instructed to notify RT if further assistance is needed.

## 2017-05-06 LAB — VAS US DOPPLER PRE CABG
LCCADDIAS: -8 cm/s
LCCADSYS: -50 cm/s
LCCAPDIAS: -8 cm/s
LCCAPSYS: -72 cm/s
LEFT ECA DIAS: -3 cm/s
LEFT VERTEBRAL DIAS: -10 cm/s
Left ICA dist sys: -57 cm/s
Left ICA prox dias: -8 cm/s
Left ICA prox sys: -54 cm/s
RCCADSYS: -56 cm/s
RIGHT ECA DIAS: -9 cm/s
RIGHT VERTEBRAL DIAS: -13 cm/s
Right CCA prox dias: 8 cm/s
Right CCA prox sys: 67 cm/s

## 2017-05-06 NOTE — Progress Notes (Signed)
Pt walked 4 labs around the nurses' station. Patient ambulated with steady gait and denied any CP, SOB, dizziness or lightheadedness. Pt's VSS and will continue to monitor.

## 2017-05-06 NOTE — Plan of Care (Signed)
Problem: Safety: Goal: Ability to remain free from injury will improve Outcome: Progressing Pt has verbalizes understanding how to use the call light system and the purpose of it to keep him safe. Wife has remained at pt's bedside.   Problem: Activity: Goal: Ability to return to baseline activity level will improve Outcome: Progressing Pt will continue to ambulate throughout the hallways to regain baseline strength. Pt is continuing without complication or complaint.   Problem: Education: Goal: Understanding of medication regimen will improve Outcome: Progressing Will continue to educate pt on new medications that have been started. Pt has remained receptive to starting all new medications are continues to tolerate well.

## 2017-05-06 NOTE — Progress Notes (Signed)
Progress Note  Patient Name: Aroldo Galli Date of Encounter: 05/06/2017  Primary Cardiologist: In [place] Dr. Sol Blazing  Subjective   No chest pain off heparin sitting in chair Newlywed wife of a year with him this am   Inpatient Medications    Scheduled Meds: . allopurinol  100 mg Oral Daily  . aspirin EC  81 mg Oral Daily  . atorvastatin  80 mg Oral q1800  . carvedilol  6.25 mg Oral BID WC  . enalapril  2.5 mg Oral QHS  . fluticasone   Topical BID  . furosemide  20 mg Oral Daily  . isosorbide mononitrate  30 mg Oral QHS  . levothyroxine  150 mcg Oral QAC breakfast  . multivitamin with minerals  1 tablet Oral Daily  . ranolazine  500 mg Oral BID  . sodium chloride flush  3 mL Intravenous Q12H  . sodium chloride flush  3 mL Intravenous Q12H   Continuous Infusions: . sodium chloride    . sodium chloride     PRN Meds: sodium chloride, sodium chloride, acetaminophen, hydrALAZINE, morphine injection, nitroGLYCERIN, ondansetron (ZOFRAN) IV, sodium chloride flush, sodium chloride flush   Vital Signs    Vitals:   05/05/17 1738 05/05/17 2058 05/05/17 2222 05/06/17 0432  BP: 119/79 (!) 127/92  103/63  Pulse:  67  (!) 56  Resp: 16 19 (!) 22 20  Temp:  98 F (36.7 C)  98.2 F (36.8 C)  TempSrc:      SpO2:  100%  100%  Weight:    190 lb 4.8 oz (86.3 kg)  Height:        Intake/Output Summary (Last 24 hours) at 05/06/17 0912 Last data filed at 05/06/17 0432  Gross per 24 hour  Intake              480 ml  Output              500 ml  Net              -20 ml   Filed Weights   05/04/17 0507 05/05/17 0418 05/06/17 0432  Weight: 190 lb 4.8 oz (86.3 kg) 189 lb (85.7 kg) 190 lb 4.8 oz (86.3 kg)    Telemetry    Mostly sinus rhythm with PACs and PVCs. Short run of PVCs / NSVT - Personally Reviewed  ECG    No new - Personally Reviewed  Physical Exam   Physical Exam  Constitutional: He is oriented to person, place, and time. He appears well-developed and well-nourished.  No distress.  HENT:  Head: Normocephalic and atraumatic.  Mouth/Throat: No oropharyngeal exudate.  Eyes: Pupils are equal, round, and reactive to light. EOM are normal.  Neck: Normal range of motion. Neck supple.  No carotid bruit. Radiated murmur  Cardiovascular: Normal rate, regular rhythm and intact distal pulses.  Exam reveals no gallop and no friction rub.   Murmur heard.  Harsh crescendo-decrescendo midsystolic murmur is present with a grade of 2/6  at the upper right sternal border, upper left sternal border radiating to the neck Pulmonary/Chest: Effort normal and breath sounds normal. No respiratory distress. He has no wheezes. He has no rales. He exhibits no tenderness.  Abdominal: Soft. Bowel sounds are normal. He exhibits no distension. There is no tenderness. There is no rebound.  Musculoskeletal: Normal range of motion. He exhibits no edema (Varicose veins noted, no edema).  Neurological: He is alert and oriented to person, place, and time.  Skin: Skin is  warm and dry. No rash noted. He is not diaphoretic. No erythema.  Psychiatric: He has a normal mood and affect. His behavior is normal. Judgment and thought content normal.  Nursing note and vitals reviewed.   Labs    Chemistry  Recent Labs Lab 05/02/17 0214 05/03/17 0935 05/04/17 0403  NA 136 137 138  K 3.5 4.1 3.8  CL 101 102 106  CO2 28 27 27   GLUCOSE 108* 119* 100*  BUN 21* 16 16  CREATININE 1.02 0.93 0.97  CALCIUM 8.7* 9.0 8.5*  GFRNONAA >60 >60 >60  GFRAA >60 >60 >60  ANIONGAP 7 8 5      Hematology  Recent Labs Lab 05/03/17 0242 05/04/17 0403 05/05/17 0232  WBC 6.7 7.0 7.7  RBC 4.84 4.75 4.54  HGB 14.6 14.1 13.4  HCT 43.5 42.8 41.1  MCV 89.9 90.1 90.5  MCH 30.2 29.7 29.5  MCHC 33.6 32.9 32.6  RDW 13.5 13.6 13.7  PLT 142* 140* 153    Cardiac Enzymes No results for input(s): TROPONINI in the last 168 hours.  No results for input(s): TROPIPOC in the last 168 hours.   BNP No results for  input(s): BNP, PROBNP in the last 168 hours.   DDimer  No results for input(s): DDIMER in the last 168 hours.   Radiology    No results found.  Cardiac Studies   Cardiac cath 04/30/17 (Images personally reviewed) Procedures   Left Heart Cath and Coronary Angiography  Conclusion     Dist RCA lesion, 80 %stenosed.  Ost Cx lesion, 99 %stenosed.  Mid Cx lesion, 99 %stenosed.  Ramus lesion, 70 %stenosed.  Ost LAD to [facility] LAD lesion, 80 %stenosed.     IMPRESSION:Mr Bernales has three-vessel disease with moderate LV dysfunction. His LAD appears subtotally occluded stenosis circumflex at the ostium and midportion. These are calcified vessels. He is a large ramus branch that reaches his apex with moderate proximal disease. He also has 80% distal dominant RCA at the January just prior to the takeoff of the PDA which gives right to left collaterals to the circumflex. I crossed the aortic valve and did not demonstrate a gradient. Intervention of his circumflex would be high risk given the angulation of the takeoff and the fact that it is calcified with a essentially occluded LAD. 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 I'm going to suggest that he have surgical consultation. The sheath was removed and pressure held in the Cath Lab. We will restart heparin without bolus in 4 hours. He left the lab in stable condition.   ECHO 04/28/17: Moderate to severely reduced EF. 30-35%. Akinesis of the mid-apical lateral anteroseptal, d anterior and apical wall - consistent with prior LAD infarct. Mild-moderate aortic stenosis (AVA~0.83 cm). Moderate LA and RA dilation. Mild RV dilation but normal function.    Patient Profile     83 y.o. male with PMH CAD s/p MI and stents, obstructive sleep apnea on CPAP, HTN, hypothyroidism who is transferred from [facility] to [facility] for NSTEMI. Pt lives in [place].  Assessment & Plan    1.  Non-STEMI  Troponin 37.52 - with multivessel CAD  Known  chronic LAD disease with some recanalization but dense apical scar.  Culprit lesion is most likely related to the circumflex lesions, as the RCA and ramus lesions do not appear to be critical - although they will be the only potential PCI targets.   Unclear with the culprit lesion was with basically chronic LAD  disease and likely anterior scar, the circumflex is also very severely diseased and probably not a good PCI target.   Seen by Dr Dorris Fetch and tentatively on for CABG [facility]   2.  Chronic systolic heart failure-EF around 30-35% with a previous anteroapical infarction, aortic stenosis appears only mild to moderate.    On ACE inhibitor as well as beta blocker.  He did have a short run of nonsustained VT. Not unexpectedly low EF, will continue beta blocker.  3.  Aortic stenosis mild to mod -  TEE with no indication for AVR especially given age   74.  Right bundle branch block no high grade AV block   5.  PVCs/NSVT - replete potassium , beta blocker  6. HLD  = high-dose statin    Charlton Haws, MD

## 2017-05-07 MED ORDER — WHITE PETROLATUM GEL
Status: AC
  Administered 2017-05-07: 11:00:00
  Filled 2017-05-07: qty 1

## 2017-05-07 MED ORDER — FLUOCINONIDE 0.05 % EX OINT
TOPICAL_OINTMENT | Freq: Two times a day (BID) | CUTANEOUS | Status: DC
  Administered 2017-05-07: 1 via TOPICAL
  Administered 2017-05-08 (×2): via TOPICAL
  Filled 2017-05-07: qty 15

## 2017-05-07 NOTE — Progress Notes (Signed)
CARDIAC REHAB PHASE I   PRE:  Rate/Rhythm: 66 SR  BP:  Sitting: 132/63        SaO2: 96 RA  MODE:  Ambulation: 700 ft   POST:  Rate/Rhythm: 79 bigeminy  BP:  Sitting: 137/81         SaO2: 98 RA  Pt ambulated 700 ft on RA, hand held assist, tolerated well. Pt states he did have some dizziness towards end of walk, denies any other complaints, declined rest stop. Pt in and out of bigeminy. Pt to recliner after walk, call bell within reach. Will follow.   [phone] Joylene Grapes, RN, BSN 05/07/2017 11:15 AM

## 2017-05-07 NOTE — Progress Notes (Signed)
Progress Note  Patient Name: Scott [name] Date of Encounter: 05/07/2017  Primary Cardiologist: Dr. Sol Blazing [facility])  Subjective   No chest pain over the weekend, walked many laps around the nursing station.   Inpatient Medications    Scheduled Meds: . allopurinol  100 mg Oral Daily  . aspirin EC  81 mg Oral Daily  . atorvastatin  80 mg Oral q1800  . carvedilol  6.25 mg Oral BID WC  . enalapril  2.5 mg Oral QHS  . fluticasone   Topical BID  . furosemide  20 mg Oral Daily  . isosorbide mononitrate  30 mg Oral QHS  . levothyroxine  150 mcg Oral QAC breakfast  . multivitamin with minerals  1 tablet Oral Daily  . ranolazine  500 mg Oral BID  . sodium chloride flush  3 mL Intravenous Q12H  . sodium chloride flush  3 mL Intravenous Q12H   Continuous Infusions: . sodium chloride    . sodium chloride     PRN Meds: sodium chloride, sodium chloride, acetaminophen, hydrALAZINE, morphine injection, nitroGLYCERIN, ondansetron (ZOFRAN) IV, sodium chloride flush, sodium chloride flush   Vital Signs    Vitals:   05/06/17 0911 05/06/17 1501 05/06/17 1946 05/07/17 0420  BP:  113/73 124/73 112/66  Pulse:   68 61  Resp: (!) 22 20 19 20   Temp:   98 F (36.7 C) 98.6 F (37 C)  TempSrc:      SpO2:   99% 94%  Weight:    189 lb 4.8 oz (85.9 kg)  Height:        Intake/Output Summary (Last 24 hours) at 05/07/17 0931 Last data filed at 05/07/17 0420  Gross per 24 hour  Intake                0 ml  Output              800 ml  Net             -800 ml   Filed Weights   05/05/17 0418 05/06/17 0432 05/07/17 0420  Weight: 189 lb (85.7 kg) 190 lb 4.8 oz (86.3 kg) 189 lb 4.8 oz (85.9 kg)    Telemetry    SR with PVCs - Personally Reviewed  ECG    N/a - Personally Reviewed  Physical Exam   General: Well developed, well nourished, male appearing in no acute distress. Head: Normocephalic, atraumatic.  Neck: Supple without bruits, JVD. Lungs:  Resp regular and unlabored,  CTA. Heart: RRR, S1, S2, no S3, S4, or 2/6 systolic murmur; no rub. Abdomen: Soft, non-tender, non-distended with normoactive bowel sounds. No hepatomegaly. No rebound/guarding. No obvious abdominal masses. Extremities: No clubbing, cyanosis, mild LE edema. Distal pedal pulses are 2+ bilaterally. Neuro: Alert and oriented X 3. Moves all extremities spontaneously. Psych: Normal affect.  Labs    Chemistry Recent Labs Lab 05/02/17 0214 05/03/17 0935 05/04/17 0403  NA 136 137 138  K 3.5 4.1 3.8  CL 101 102 106  CO2 28 27 27   GLUCOSE 108* 119* 100*  BUN 21* 16 16  CREATININE 1.02 0.93 0.97  CALCIUM 8.7* 9.0 8.5*  GFRNONAA >60 >60 >60  GFRAA >60 >60 >60  ANIONGAP 7 8 5      Hematology Recent Labs Lab 05/03/17 0242 05/04/17 0403 05/05/17 0232  WBC 6.7 7.0 7.7  RBC 4.84 4.75 4.54  HGB 14.6 14.1 13.4  HCT 43.5 42.8 41.1  MCV 89.9 90.1 90.5  MCH 30.2 29.7 29.5  MCHC 33.6 32.9 32.6  RDW 13.5 13.6 13.7  PLT 142* 140* 153    Cardiac EnzymesNo results for input(s): TROPONINI in the last 168 hours. No results for input(s): TROPIPOC in the last 168 hours.   BNPNo results for input(s): BNP, PROBNP in the last 168 hours.   DDimer No results for input(s): DDIMER in the last 168 hours.    Radiology    No results found.  Cardiac Studies   LHC: 04/30/17  IMPRESSION:Scott [name] has three-vessel disease with moderate LV dysfunction. His LAD appears subtotally occluded stenosis circumflex at the ostium and midportion. These are calcified vessels. He is a large ramus branch that reaches his apex with moderate proximal disease. He also has 80% distal dominant RCA at the January just prior to the takeoff of the PDA which gives right to left collaterals to the circumflex. I crossed the aortic valve and did not demonstrate a gradient. Intervention of his circumflex would be high risk given the angulation of the takeoff and the fact that it is calcified with a essentially occluded LAD. 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 I'm going to suggest that he have surgical consultation. The sheath was removed and pressure held in the Cath Lab. We will restart heparin without bolus in 4 hours. He left the lab in stable condition.  [name]. MD, [facility]  Diagnostic Diagram      TEE: 05/04/17  Study Conclusions  - Left ventricle: The cavity size was mildly dilated. Systolic   function was moderately to severely reduced. The estimated   ejection fraction was in the range of 30% to 35%. Wall motion was   normal; there were no regional wall motion abnormalities. - Aortic valve: There is severe aoritc annular calcification that   appears somewhat eccentric. The leaflets appear to open well and   at most is mild aortic stenosis. Severely calcified annulus.   Trileaflet; mildly thickened, mildly calcified leaflets. There   was mild regurgitation. - Mitral valve: There was mild to moderate regurgitation directed   centrally. Regurgitant volume (PISA): 20 ml. ERO 0.11cm2. - Left atrium: No evidence of thrombus in the atrial cavity or   appendage. - Right ventricle: The cavity size was mildly dilated. Wall   thickness was normal. - Right atrium: The atrium was moderately dilated. No evidence of   thrombus in the atrial cavity or appendage. - Tricuspid valve: No evidence of vegetation. - Pulmonic valve: There was trivial regurgitation.  Patient Profile     83 y.o. male  with PMH CAD s/p MI and stents, obstructive sleep apnea on CPAP, HTN, hypothyroidism who is transferred from [facility] to [facility] for NSTEMI. Pt lives in [place]. Seen by [facility] for possible CABG.   Assessment & Plan    1. NSTEMI: multivessel disease noted on cath. Plavix on hold for potential bypass surgery, though may not have good conduits for grafting. Dr. Dorris Fetch indicated the possibility of a hybrid procedure. He was transitioned off IV heparin prior to the weekend, and no recurrent chest  pain. Currently awaiting decision regarding plan for PCI (Ramus/RCA, and possible Lcx) vs CABG.  --  Continue, ASA, statin, imdur, ranexa, BB and ACEi. Plavix on hold until decision about cath made.   2. Acute respiratory failure with hypoxia: Resolved. 2/2 to NSTEMI with acute combined HF.   3. HTN: controlled.   4. HL: on high dose statin  5. AS: noted as mild to moderate on TEE  Signed, Laverda Page, NP  05/07/2017, 9:31 AM    I have examined the patient and reviewed assessment and plan and discussed with patient.  Agree with above as stated.  Vein mapping done.  Will have to check with cardiac surgery whether veins are suitable for CABG.  He feels well today.    Lance Muss

## 2017-05-07 NOTE — Anesthesia Postprocedure Evaluation (Signed)
Anesthesia Post Note  Patient: Scott [name]  Procedure(s) Performed: Procedure(s) (LRB): TRANSESOPHAGEAL ECHOCARDIOGRAM (TEE) (N/A)     Patient location during evaluation: PACU Anesthesia Type: MAC Level of consciousness: awake and alert Pain management: pain level controlled Vital Signs Assessment: post-procedure vital signs reviewed and stable Respiratory status: spontaneous breathing, nonlabored ventilation, respiratory function stable and patient connected to nasal cannula oxygen Cardiovascular status: stable and blood pressure returned to baseline Anesthetic complications: no    Last Vitals:  Vitals:   05/06/17 1946 05/07/17 0420  BP: 124/73 112/66  Pulse: 68 61  Resp: 19 20  Temp: 36.7 C 37 C    Last Pain:  Vitals:   05/06/17 2010  TempSrc:   PainSc: 0-No pain                 Ndidi Nesby

## 2017-05-07 NOTE — Progress Notes (Signed)
Pt brought CPAP from home. Per pt's spouse, no assistance is needed at this time. Advised pt to notify RT if they need anything.

## 2017-05-08 ENCOUNTER — Inpatient Hospital Stay (HOSPITAL_COMMUNITY): Payer: Medicare (Managed Care)

## 2017-05-08 ENCOUNTER — Encounter (HOSPITAL_COMMUNITY): Payer: Self-pay | Admitting: Cardiology

## 2017-05-08 DIAGNOSIS — I2510 Atherosclerotic heart disease of native coronary artery without angina pectoris: Secondary | ICD-10-CM

## 2017-05-08 LAB — URINALYSIS, ROUTINE W REFLEX MICROSCOPIC
Bilirubin Urine: NEGATIVE
Glucose, UA: NEGATIVE mg/dL
Hgb urine dipstick: NEGATIVE
Ketones, ur: NEGATIVE mg/dL
LEUKOCYTES UA: NEGATIVE
NITRITE: NEGATIVE
Protein, ur: NEGATIVE mg/dL
SPECIFIC GRAVITY, URINE: 1.008 (ref 1.005–1.030)
pH: 7 (ref 5.0–8.0)

## 2017-05-08 LAB — ABO/RH: ABO/RH(D): B NEG

## 2017-05-08 LAB — TYPE AND SCREEN
ABO/RH(D): B NEG
ANTIBODY SCREEN: NEGATIVE

## 2017-05-08 MED ORDER — TRANEXAMIC ACID (OHS) BOLUS VIA INFUSION
15.0000 mg/kg | INTRAVENOUS | Status: AC
Start: 2017-05-09 — End: 2017-05-09
  Administered 2017-05-09: 1285.5 mg via INTRAVENOUS
  Filled 2017-05-08: qty 1286

## 2017-05-08 MED ORDER — MAGNESIUM SULFATE 50 % IJ SOLN
40.0000 meq | INTRAMUSCULAR | Status: DC
  Filled 2017-05-08: qty 10

## 2017-05-08 MED ORDER — PLASMA-LYTE 148 IV SOLN
INTRAVENOUS | Status: AC
  Administered 2017-05-09: 500 mL
  Filled 2017-05-08: qty 2.5

## 2017-05-08 MED ORDER — POTASSIUM CHLORIDE 2 MEQ/ML IV SOLN
80.0000 meq | INTRAVENOUS | Status: DC
  Filled 2017-05-08: qty 40

## 2017-05-08 MED ORDER — DOPAMINE-DEXTROSE 3.2-5 MG/ML-% IV SOLN
0.0000 ug/kg/min | INTRAVENOUS | Status: DC
  Filled 2017-05-08: qty 250

## 2017-05-08 MED ORDER — TRANEXAMIC ACID (OHS) PUMP PRIME SOLUTION
2.0000 mg/kg | INTRAVENOUS | Status: DC
  Filled 2017-05-08: qty 1.71

## 2017-05-08 MED ORDER — BISACODYL 5 MG PO TBEC
5.0000 mg | DELAYED_RELEASE_TABLET | Freq: Once | ORAL | Status: AC
  Administered 2017-05-08: 5 mg via ORAL
  Filled 2017-05-08: qty 1

## 2017-05-08 MED ORDER — TEMAZEPAM 15 MG PO CAPS: 15.0000 mg | ORAL_CAPSULE | Freq: Once | ORAL | Status: DC | PRN

## 2017-05-08 MED ORDER — NITROGLYCERIN IN D5W 200-5 MCG/ML-% IV SOLN
2.0000 ug/min | INTRAVENOUS | Status: AC
  Administered 2017-05-09: 16.6 ug/min via INTRAVENOUS
  Filled 2017-05-08: qty 250

## 2017-05-08 MED ORDER — DEXTROSE 5 % IV SOLN
1.5000 g | INTRAVENOUS | Status: AC
  Administered 2017-05-09: .75 g via INTRAVENOUS
  Administered 2017-05-09: 1.5 g via INTRAVENOUS
  Filled 2017-05-08: qty 1.5

## 2017-05-08 MED ORDER — TRANEXAMIC ACID 1000 MG/10ML IV SOLN
1.5000 mg/kg/h | INTRAVENOUS | Status: AC
  Administered 2017-05-09: 1.5 mg/kg/h via INTRAVENOUS
  Filled 2017-05-08: qty 25

## 2017-05-08 MED ORDER — SODIUM CHLORIDE 0.9 % IV SOLN
INTRAVENOUS | Status: DC
  Filled 2017-05-08: qty 30

## 2017-05-08 MED ORDER — DEXTROSE 5 % IV SOLN
750.0000 mg | INTRAVENOUS | Status: DC
  Filled 2017-05-08: qty 750

## 2017-05-08 MED ORDER — DEXMEDETOMIDINE HCL IN NACL 400 MCG/100ML IV SOLN
0.1000 ug/kg/h | INTRAVENOUS | Status: DC
  Filled 2017-05-08: qty 100

## 2017-05-08 MED ORDER — SODIUM CHLORIDE 0.9 % IV SOLN
INTRAVENOUS | Status: DC
  Filled 2017-05-08: qty 1

## 2017-05-08 MED ORDER — EPINEPHRINE PF 1 MG/ML IJ SOLN
0.0000 ug/min | INTRAVENOUS | Status: DC
  Filled 2017-05-08: qty 4

## 2017-05-08 MED ORDER — DIAZEPAM 2 MG PO TABS
2.0000 mg | ORAL_TABLET | Freq: Once | ORAL | Status: AC
  Administered 2017-05-09: 2 mg via ORAL
  Filled 2017-05-08: qty 1

## 2017-05-08 MED ORDER — CHLORHEXIDINE GLUCONATE 0.12 % MT SOLN
15.0000 mL | Freq: Once | OROMUCOSAL | Status: AC
  Administered 2017-05-09: 15 mL via OROMUCOSAL
  Filled 2017-05-08: qty 15

## 2017-05-08 MED ORDER — CHLORHEXIDINE GLUCONATE CLOTH 2 % EX PADS
6.0000 | MEDICATED_PAD | Freq: Once | CUTANEOUS | Status: AC
  Administered 2017-05-08: 6 via TOPICAL

## 2017-05-08 MED ORDER — SODIUM CHLORIDE 0.9 % IV SOLN
30.0000 ug/min | INTRAVENOUS | Status: DC
  Filled 2017-05-08: qty 2

## 2017-05-08 MED ORDER — VANCOMYCIN HCL 10 G IV SOLR
1500.0000 mg | INTRAVENOUS | Status: AC
  Administered 2017-05-09: 1500 mg via INTRAVENOUS
  Filled 2017-05-08: qty 1500

## 2017-05-08 MED ORDER — CHLORHEXIDINE GLUCONATE CLOTH 2 % EX PADS
6.0000 | MEDICATED_PAD | Freq: Once | CUTANEOUS | Status: AC
Start: 2017-05-08 — End: 2017-05-08
  Administered 2017-05-08: 6 via TOPICAL

## 2017-05-08 NOTE — Anesthesia Preprocedure Evaluation (Addendum)
Anesthesia Evaluation  Patient identified by MRN, date of birth, ID band Patient awake    Reviewed: Allergy & Precautions, NPO status , Patient's Chart, lab work & pertinent test results  Airway Mallampati: II  TM Distance: >3 FB Neck ROM: Full   Comment: Prominent upper teeth.  Dental  (+) Teeth Intact, Dental Advisory Given   Pulmonary neg pulmonary ROS,    breath sounds clear to auscultation       Cardiovascular hypertension, Pt. on medications and Pt. on home beta blockers + CAD, + Past MI, + Peripheral Vascular Disease and +CHF  negative cardio ROS   Rhythm:Regular Rate:Normal     Neuro/Psych negative neurological ROS     GI/Hepatic negative GI ROS, Neg liver ROS,   Endo/Other  Hypothyroidism   Renal/GU negative Renal ROS     Musculoskeletal negative musculoskeletal ROS (+)   Abdominal   Peds  Hematology negative hematology ROS (+)   Anesthesia Other Findings Day of surgery medications reviewed with the patient.  Reproductive/Obstetrics                            Lab Results  Component Value Date   WBC 7.7 05/05/2017   HGB 13.4 05/05/2017   HCT 41.1 05/05/2017   MCV 90.5 05/05/2017   PLT 153 05/05/2017   Lab Results  Component Value Date   CREATININE 0.97 05/04/2017   BUN 16 05/04/2017   NA 138 05/04/2017   K 3.8 05/04/2017   CL 106 05/04/2017   CO2 27 05/04/2017   Lab Results  Component Value Date   INR 0.87 04/29/2017   EKG: NSR, RBBB  Echo: Left ventricle: The cavity size was mildly dilated. Systolic   function was moderately to severely reduced. The estimated ejection fraction was in the range of 30% to 35%. Wall motion was normal; there were no regional wall motion abnormalities. - Aortic valve: There is severe aoritc annular calcification that appears somewhat eccentric. The leaflets appear to open well and at most is mild aortic stenosis. Severely calcified  annulus.Trileaflet; mildly thickened, mildly calcified leaflets. There was mild regurgitation. - Mitral valve: There was mild to moderate regurgitation directed centrally. Regurgitant volume (PISA): 20 ml. ERO 0.11cm2. - Left atrium: No evidence of thrombus in the atrial cavity or appendage. - Right ventricle: The cavity size was mildly dilated. Wall   thickness was normal. - Right atrium: The atrium was moderately dilated. No evidence of thrombus in the atrial cavity or appendage. - Tricuspid valve: No evidence of vegetation. - Pulmonic valve: There was trivial regurgitation.  Anesthesia Physical Anesthesia Plan  ASA: IV  Anesthesia Plan: General   Post-op Pain Management:    Induction: Intravenous  PONV Risk Score and Plan: 3 and Ondansetron, Dexamethasone, Midazolam and Propofol infusion  Airway Management Planned: Oral ETT  Additional Equipment: Arterial line, CVP, PA Cath, TEE and Ultrasound Guidance Line Placement  Intra-op Plan:   Post-operative Plan: Post-operative intubation/ventilation  Informed Consent: I have reviewed the patients History and Physical, chart, labs and discussed the procedure including the risks, benefits and alternatives for the proposed anesthesia with the patient or authorized representative who has indicated his/her understanding and acceptance.     Plan Discussed with: CRNA  Anesthesia Plan Comments:         Anesthesia Quick Evaluation

## 2017-05-08 NOTE — Care Management Important Message (Signed)
Important Message  Patient Details  Name: Scott [name] MRN: [id number] Date of Birth: 21-May-1927   Medicare Important Message Given:  Yes    Conni Knighton Abena 05/08/2017, 9:45 AM

## 2017-05-08 NOTE — Plan of Care (Signed)
Problem: Safety: Goal: Ability to remain free from injury will improve Outcome: Progressing Pt ambulates in room and in hallway independently without difficulty; has steady gait and is aware of his surroundings.  Problem: Activity: Goal: Ability to return to baseline activity level will improve Outcome: Progressing Pt walks frequently around nurses station with wife.  Problem: Activity: Goal: Ability to tolerate increased activity will improve Outcome: Progressing Tolerates ambulation well; VS remain stable; pt remains CP free.

## 2017-05-08 NOTE — Progress Notes (Signed)
Progress Note  Patient Name: Scott [name] Date of Encounter: 05/08/2017  Primary Cardiologist: Dr. Sol Blazing [facility])  Subjective   Doing well overall, ready to have surgery. Wife and son at the bedside.   Inpatient Medications    Scheduled Meds: . allopurinol  100 mg Oral Daily  . aspirin EC  81 mg Oral Daily  . atorvastatin  80 mg Oral q1800  . carvedilol  6.25 mg Oral BID WC  . enalapril  2.5 mg Oral QHS  . fluocinonide ointment   Topical BID  . furosemide  20 mg Oral Daily  . isosorbide mononitrate  30 mg Oral QHS  . levothyroxine  150 mcg Oral QAC breakfast  . multivitamin with minerals  1 tablet Oral Daily  . ranolazine  500 mg Oral BID  . sodium chloride flush  3 mL Intravenous Q12H  . sodium chloride flush  3 mL Intravenous Q12H   Continuous Infusions: . sodium chloride    . sodium chloride     PRN Meds: sodium chloride, sodium chloride, acetaminophen, hydrALAZINE, morphine injection, nitroGLYCERIN, ondansetron (ZOFRAN) IV, sodium chloride flush, sodium chloride flush   Vital Signs    Vitals:   05/07/17 1719 05/07/17 2100 05/08/17 0725 05/08/17 0733  BP: 134/76 (!) 136/59  120/76  Pulse: 69 67  60  Resp:  20  19  Temp:  98.7 F (37.1 C)  98.2 F (36.8 C)  TempSrc:    Oral  SpO2:  97%  96%  Weight:   188 lb 14.4 oz (85.7 kg)   Height:        Intake/Output Summary (Last 24 hours) at 05/08/17 1139 Last data filed at 05/08/17 0700  Gross per 24 hour  Intake              240 ml  Output              400 ml  Net             -160 ml   Filed Weights   05/06/17 0432 05/07/17 0420 05/08/17 0725  Weight: 190 lb 4.8 oz (86.3 kg) 189 lb 4.8 oz (85.9 kg) 188 lb 14.4 oz (85.7 kg)    Telemetry    SR with PVCs - Personally Reviewed  ECG    N/a - Personally Reviewed  Physical Exam   General: Well developed, well nourished, male appearing in no acute distress. Head: Normocephalic, atraumatic.  Neck: Supple without bruits, JVD. Lungs:  Resp regular and  unlabored, CTA. Heart: RRR, S1, S2, no S3, S4, 2/6 systolic murmur; no rub. Abdomen: Soft, non-tender, non-distended with normoactive bowel sounds. No hepatomegaly. No rebound/guarding. No obvious abdominal masses. Extremities: No clubbing, cyanosis, mild LE edema. Distal pedal pulses are 2+ bilaterally. Neuro: Alert and oriented X 3. Moves all extremities spontaneously. Psych: Normal affect.  Labs    Chemistry Recent Labs Lab 05/02/17 0214 05/03/17 0935 05/04/17 0403  NA 136 137 138  K 3.5 4.1 3.8  CL 101 102 106  CO2 28 27 27   GLUCOSE 108* 119* 100*  BUN 21* 16 16  CREATININE 1.02 0.93 0.97  CALCIUM 8.7* 9.0 8.5*  GFRNONAA >60 >60 >60  GFRAA >60 >60 >60  ANIONGAP 7 8 5      Hematology Recent Labs Lab 05/03/17 0242 05/04/17 0403 05/05/17 0232  WBC 6.7 7.0 7.7  RBC 4.84 4.75 4.54  HGB 14.6 14.1 13.4  HCT 43.5 42.8 41.1  MCV 89.9 90.1 90.5  MCH 30.2 29.7 29.5  MCHC 33.6 32.9 32.6  RDW 13.5 13.6 13.7  PLT 142* 140* 153    Cardiac EnzymesNo results for input(s): TROPONINI in the last 168 hours. No results for input(s): TROPIPOC in the last 168 hours.   BNPNo results for input(s): BNP, PROBNP in the last 168 hours.   DDimer No results for input(s): DDIMER in the last 168 hours.    Radiology    No results found.  Cardiac Studies   LHC: 04/30/17  IMPRESSION:Mr Bessire has three-vessel disease with moderate LV dysfunction. His LAD appears subtotally occluded stenosis circumflex at the ostium and midportion. These are calcified vessels. He is a large ramus branch that reaches his apex with moderate proximal disease. He also has 80% distal dominant RCA at the January just prior to the takeoff of the PDA which gives right to left collaterals to the circumflex. I crossed the aortic valve and did not demonstrate a gradient. Intervention of his circumflex would be high risk given the angulation of the takeoff and the fact that it is calcified with a essentially occluded  LAD. 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 I'm going to suggest that he have surgical consultation. The sheath was removed and pressure held in the Cath Lab. We will restart heparin without bolus in 4 hours. He left the lab in stable condition.  [name]. MD, [facility]  Diagnostic Diagram      TEE: 05/04/17  Study Conclusions  - Left ventricle: The cavity size was mildly dilated. Systolic function was moderately to severely reduced. The estimated ejection fraction was in the range of 30% to 35%. Wall motion was normal; there were no regional wall motion abnormalities. - Aortic valve: There is severe aoritc annular calcification that appears somewhat eccentric. The leaflets appear to open well and at most is mild aortic stenosis. Severely calcified annulus. Trileaflet; mildly thickened, mildly calcified leaflets. There was mild regurgitation. - Mitral valve: There was mild to moderate regurgitation directed centrally. Regurgitant volume (PISA): 20 ml. ERO 0.11cm2. - Left atrium: No evidence of thrombus in the atrial cavity or appendage. - Right ventricle: The cavity size was mildly dilated. Wall thickness was normal. - Right atrium: The atrium was moderately dilated. No evidence of thrombus in the atrial cavity or appendage. - Tricuspid valve: No evidence of vegetation. - Pulmonic valve: There was trivial regurgitation.  Patient Profile     83 y.o. male with PMH CAD s/p MI and stents, obstructive sleep apnea on CPAP, HTN, hypothyroidism who is transferred from [facility] to [facility] for NSTEMI. Pt lives in [place]. Seen by [facility] for possible CABG  Assessment & Plan    1. NSTEMI: multivessel disease noted on cath. Plavix on hold for potential bypass surgery, though may not have good conduits for grafting. Dr. Dorris Fetch indicated the possibility of a hybrid procedure. He was transitioned off IV heparin prior to the weekend, and no  recurrent chest pain. Plan is for CABG tomorrow in the morning.  -- Continue, ASA, statin, imdur, ranexa, BB and ACEi. Plavix on hold.  2. Acute respiratory failure with hypoxia: Resolved. 2/2 to NSTEMI with acute combined HF.   3. HTN: controlled.   4. HL: on high dose statin  5. AS: noted as mild to moderate on TEE   Signed, Laverda Page, NP  05/08/2017, 11:39 AM    I have examined the patient and reviewed assessment and plan and discussed with patient.  Agree with above as stated.  Plan for CABG tomorrow.  Eventually will head back to [place].  Plavix on hold.   Lance Muss

## 2017-05-08 NOTE — Progress Notes (Signed)
CARDIAC REHAB PHASE I   PRE:  Rate/Rhythm: 62 SR  BP:  Sitting: 115/62        SaO2: 96 RA  MODE:  Ambulation: 1000 ft   POST:  Rate/Rhythm: 77 SR  BP:  Sitting: 138/81         SaO2: 97 RA  Pt ambulated 1000 ft on RA, hand held assist, mostly steady gait, tolerated well with no complaints. Completed cardiac surgery pre-op education with pt, pt's wife and son at bedside. Reviewed IS, sternal precautions, activity progression, cardiac surgery booklet and cardiac surgery guidelines. Pt and family verbalized understanding. Pt has viewed pre-op video. Pt to recliner after walk, call bell within reach. Will follow post-op.  [phone] Joylene Grapes, RN, BSN 05/08/2017 10:51 AM

## 2017-05-08 NOTE — Progress Notes (Signed)
      [address]       [place] [address]             [phone]       No complaints  BP 104/60 (BP Location: Right Arm)   Pulse 61   Temp 98.7 F (37.1 C) (Oral)   Resp 20   Ht 5' 10.5" (1.791 m)   Wt 188 lb 14.4 oz (85.7 kg)   SpO2 98%   BMI 26.72 kg/m    Intake/Output Summary (Last 24 hours) at 05/08/17 1922 Last data filed at 05/08/17 1830  Gross per 24 hour  Intake              840 ml  Output             1100 ml  Net             -260 ml   Exam unchanged  Plan for CABG in AM with bilateral IMA and right radial  He understands the risks include but are not limited to death, MI, stroke, DVT, PE, bleeding, possible need for transfusion, infection, irregular heart rhythms, as well as the possibility of other unforeseeable complications.   He accepts the risks and wishes to proceed.  Salvatore Decent Dorris Fetch, MD [facility] [phone]

## 2017-05-09 ENCOUNTER — Inpatient Hospital Stay (HOSPITAL_COMMUNITY): Payer: Medicare (Managed Care)

## 2017-05-09 ENCOUNTER — Encounter (HOSPITAL_COMMUNITY): Payer: Self-pay | Admitting: Certified Registered Nurse Anesthetist

## 2017-05-09 ENCOUNTER — Inpatient Hospital Stay (HOSPITAL_COMMUNITY)
Admission: EM | Disposition: A | Discharge status: Home Health | Payer: Self-pay | Source: Home / Self Care | Attending: Cardiovascular Disease

## 2017-05-09 ENCOUNTER — Inpatient Hospital Stay (HOSPITAL_COMMUNITY): Payer: Medicare (Managed Care) | Admitting: Anesthesiology

## 2017-05-09 DIAGNOSIS — I2510 Atherosclerotic heart disease of native coronary artery without angina pectoris: Secondary | ICD-10-CM | POA: Diagnosis present

## 2017-05-09 DIAGNOSIS — I214 Non-ST elevation (NSTEMI) myocardial infarction: Secondary | ICD-10-CM

## 2017-05-09 HISTORY — PX: TEE WITHOUT CARDIOVERSION: SHX5443

## 2017-05-09 HISTORY — PX: RADIAL ARTERY HARVEST: SHX5067

## 2017-05-09 HISTORY — PX: CORONARY ARTERY BYPASS GRAFT: SHX141

## 2017-05-09 LAB — ECHO TEE
AO mean calculated velocity dopler: 133 cm/s
AOPV: 0.54 m/s
AOVTI: 47.7 cm
AV Area VTI: 2.24 cm2
AV Mean grad: 9 mmHg
AV Peak grad: 16 mmHg
AV VEL mean LVOT/AV: 0.57
AVAREAMEANV: 2.37 cm2
AVAREAMEANVIN: 1.16 cm2/m2
AVAREAVTIIND: 1.09 cm2/m2
AVPKVEL: 200 cm/s
CHL CUP AV PEAK INDEX: 1.1
CHL CUP AV VEL: 2.23
FS: 8 % — AB (ref 28–44)
LVOT SV: 106 mL
LVOT VTI: 25.6 cm
LVOT area: 4.15 cm2
LVOT diameter: 23 mm
LVOT peak vel: 108 cm/s
LVOTVTI: 0.54 cm
Valve area index: 1.09
Valve area: 2.23 cm2

## 2017-05-09 LAB — CBC
HCT: 45.4 % (ref 39.0–52.0)
HCT: 38.2 % — ABNORMAL LOW (ref 39.0–52.0)
HEMATOCRIT: 34.9 % — AB (ref 39.0–52.0)
HEMOGLOBIN: 11.8 g/dL — AB (ref 13.0–17.0)
HEMOGLOBIN: 12.7 g/dL — AB (ref 13.0–17.0)
Hemoglobin: 14.9 g/dL (ref 13.0–17.0)
MCH: 29.3 pg (ref 26.0–34.0)
MCH: 29.9 pg (ref 26.0–34.0)
MCH: 29.5 pg (ref 26.0–34.0)
MCHC: 32.8 g/dL (ref 30.0–36.0)
MCHC: 33.8 g/dL (ref 30.0–36.0)
MCHC: 33.2 g/dL (ref 30.0–36.0)
MCV: 89.4 fL (ref 78.0–100.0)
MCV: 88.4 fL (ref 78.0–100.0)
MCV: 88.8 fL (ref 78.0–100.0)
PLATELETS: 164 10*3/uL (ref 150–400)
PLATELETS: 131 10*3/uL — AB (ref 150–400)
Platelets: 113 10*3/uL — ABNORMAL LOW (ref 150–400)
RBC: 5.08 MIL/uL (ref 4.22–5.81)
RBC: 3.95 MIL/uL — ABNORMAL LOW (ref 4.22–5.81)
RBC: 4.3 MIL/uL (ref 4.22–5.81)
RDW: 13.6 % (ref 11.5–15.5)
RDW: 13.3 % (ref 11.5–15.5)
RDW: 13.6 % (ref 11.5–15.5)
WBC: 7.5 10*3/uL (ref 4.0–10.5)
WBC: 13.3 10*3/uL — AB (ref 4.0–10.5)
WBC: 12.5 10*3/uL — ABNORMAL HIGH (ref 4.0–10.5)

## 2017-05-09 LAB — POCT I-STAT, CHEM 8
BUN: 21 mg/dL — AB (ref 6–20)
BUN: 18 mg/dL (ref 6–20)
BUN: 16 mg/dL (ref 6–20)
BUN: 18 mg/dL (ref 6–20)
BUN: 18 mg/dL (ref 6–20)
BUN: 17 mg/dL (ref 6–20)
BUN: 14 mg/dL (ref 6–20)
CALCIUM ION: 1.24 mmol/L (ref 1.15–1.40)
CALCIUM ION: 1.21 mmol/L (ref 1.15–1.40)
CALCIUM ION: 1.09 mmol/L — AB (ref 1.15–1.40)
CALCIUM ION: 1.08 mmol/L — AB (ref 1.15–1.40)
CALCIUM ION: 1.14 mmol/L — AB (ref 1.15–1.40)
CHLORIDE: 97 mmol/L — AB (ref 101–111)
CHLORIDE: 98 mmol/L — AB (ref 101–111)
CHLORIDE: 95 mmol/L — AB (ref 101–111)
CHLORIDE: 97 mmol/L — AB (ref 101–111)
CHLORIDE: 97 mmol/L — AB (ref 101–111)
CHLORIDE: 100 mmol/L — AB (ref 101–111)
CREATININE: 0.7 mg/dL (ref 0.61–1.24)
CREATININE: 0.6 mg/dL — AB (ref 0.61–1.24)
CREATININE: 0.7 mg/dL (ref 0.61–1.24)
CREATININE: 0.6 mg/dL — AB (ref 0.61–1.24)
CREATININE: 0.8 mg/dL (ref 0.61–1.24)
Calcium, Ion: 0.97 mmol/L — ABNORMAL LOW (ref 1.15–1.40)
Calcium, Ion: 1.08 mmol/L — ABNORMAL LOW (ref 1.15–1.40)
Chloride: 94 mmol/L — ABNORMAL LOW (ref 101–111)
Creatinine, Ser: 0.8 mg/dL (ref 0.61–1.24)
Creatinine, Ser: 0.6 mg/dL — ABNORMAL LOW (ref 0.61–1.24)
GLUCOSE: 108 mg/dL — AB (ref 65–99)
GLUCOSE: 104 mg/dL — AB (ref 65–99)
GLUCOSE: 120 mg/dL — AB (ref 65–99)
GLUCOSE: 130 mg/dL — AB (ref 65–99)
Glucose, Bld: 116 mg/dL — ABNORMAL HIGH (ref 65–99)
Glucose, Bld: 119 mg/dL — ABNORMAL HIGH (ref 65–99)
Glucose, Bld: 152 mg/dL — ABNORMAL HIGH (ref 65–99)
HCT: 44 % (ref 39.0–52.0)
HCT: 39 % (ref 39.0–52.0)
HCT: 34 % — ABNORMAL LOW (ref 39.0–52.0)
HCT: 31 % — ABNORMAL LOW (ref 39.0–52.0)
HCT: 38 % — ABNORMAL LOW (ref 39.0–52.0)
HEMATOCRIT: 33 % — AB (ref 39.0–52.0)
HEMATOCRIT: 33 % — AB (ref 39.0–52.0)
Hemoglobin: 15 g/dL (ref 13.0–17.0)
Hemoglobin: 13.3 g/dL (ref 13.0–17.0)
Hemoglobin: 11.2 g/dL — ABNORMAL LOW (ref 13.0–17.0)
Hemoglobin: 11.2 g/dL — ABNORMAL LOW (ref 13.0–17.0)
Hemoglobin: 11.6 g/dL — ABNORMAL LOW (ref 13.0–17.0)
Hemoglobin: 10.5 g/dL — ABNORMAL LOW (ref 13.0–17.0)
Hemoglobin: 12.9 g/dL — ABNORMAL LOW (ref 13.0–17.0)
POTASSIUM: 4.3 mmol/L (ref 3.5–5.1)
POTASSIUM: 3.9 mmol/L (ref 3.5–5.1)
POTASSIUM: 4.6 mmol/L (ref 3.5–5.1)
POTASSIUM: 4.9 mmol/L (ref 3.5–5.1)
Potassium: 4 mmol/L (ref 3.5–5.1)
Potassium: 4.1 mmol/L (ref 3.5–5.1)
Potassium: 4.7 mmol/L (ref 3.5–5.1)
SODIUM: 136 mmol/L (ref 135–145)
SODIUM: 134 mmol/L — AB (ref 135–145)
SODIUM: 134 mmol/L — AB (ref 135–145)
SODIUM: 137 mmol/L (ref 135–145)
Sodium: 139 mmol/L (ref 135–145)
Sodium: 138 mmol/L (ref 135–145)
Sodium: 135 mmol/L (ref 135–145)
TCO2: 28 mmol/L (ref 0–100)
TCO2: 29 mmol/L (ref 0–100)
TCO2: 31 mmol/L (ref 0–100)
TCO2: 29 mmol/L (ref 0–100)
TCO2: 32 mmol/L (ref 0–100)
TCO2: 28 mmol/L (ref 0–100)
TCO2: 24 mmol/L (ref 0–100)

## 2017-05-09 LAB — POCT I-STAT 3, ART BLOOD GAS (G3+)
ACID-BASE DEFICIT: 1 mmol/L (ref 0.0–2.0)
ACID-BASE EXCESS: 8 mmol/L — AB (ref 0.0–2.0)
ACID-BASE EXCESS: 7 mmol/L — AB (ref 0.0–2.0)
ACID-BASE EXCESS: 5 mmol/L — AB (ref 0.0–2.0)
ACID-BASE EXCESS: 2 mmol/L (ref 0.0–2.0)
Acid-Base Excess: 10 mmol/L — ABNORMAL HIGH (ref 0.0–2.0)
Acid-Base Excess: 11 mmol/L — ABNORMAL HIGH (ref 0.0–2.0)
BICARBONATE: 32.6 mmol/L — AB (ref 20.0–28.0)
BICARBONATE: 29.6 mmol/L — AB (ref 20.0–28.0)
BICARBONATE: 25.3 mmol/L (ref 20.0–28.0)
BICARBONATE: 25.7 mmol/L (ref 20.0–28.0)
BICARBONATE: 25 mmol/L (ref 20.0–28.0)
Bicarbonate: 34.3 mmol/L — ABNORMAL HIGH (ref 20.0–28.0)
Bicarbonate: 34.4 mmol/L — ABNORMAL HIGH (ref 20.0–28.0)
Bicarbonate: 29.8 mmol/L — ABNORMAL HIGH (ref 20.0–28.0)
Bicarbonate: 25.9 mmol/L (ref 20.0–28.0)
O2 SAT: 100 %
O2 SAT: 100 %
O2 SAT: 100 %
O2 SAT: 100 %
O2 SAT: 100 %
O2 SAT: 96 %
O2 SAT: 97 %
O2 SAT: 94 %
O2 Saturation: 96 %
PCO2 ART: 35 mmHg (ref 32.0–48.0)
PCO2 ART: 42 mmHg (ref 32.0–48.0)
PCO2 ART: 36.7 mmHg (ref 32.0–48.0)
PCO2 ART: 42.2 mmHg (ref 32.0–48.0)
PCO2 ART: 45.6 mmHg (ref 32.0–48.0)
PH ART: 7.54 — AB (ref 7.350–7.450)
PH ART: 7.453 — AB (ref 7.350–7.450)
PH ART: 7.384 (ref 7.350–7.450)
PO2 ART: 464 mmHg — AB (ref 83.0–108.0)
PO2 ART: 325 mmHg — AB (ref 83.0–108.0)
PO2 ART: 78 mmHg — AB (ref 83.0–108.0)
PO2 ART: 93 mmHg (ref 83.0–108.0)
PO2 ART: 74 mmHg — AB (ref 83.0–108.0)
Patient temperature: 36.2
Patient temperature: 36.4
Patient temperature: 36.4
TCO2: 34 mmol/L (ref 0–100)
TCO2: 36 mmol/L (ref 0–100)
TCO2: 31 mmol/L (ref 0–100)
TCO2: 36 mmol/L (ref 0–100)
TCO2: 31 mmol/L (ref 0–100)
TCO2: 27 mmol/L (ref 0–100)
TCO2: 27 mmol/L (ref 0–100)
TCO2: 27 mmol/L (ref 0–100)
TCO2: 26 mmol/L (ref 0–100)
pCO2 arterial: 42.1 mmHg (ref 32.0–48.0)
pCO2 arterial: 43.1 mmHg (ref 32.0–48.0)
pCO2 arterial: 40.3 mmHg (ref 32.0–48.0)
pCO2 arterial: 44.1 mmHg (ref 32.0–48.0)
pH, Arterial: 7.486 — ABNORMAL HIGH (ref 7.350–7.450)
pH, Arterial: 7.519 — ABNORMAL HIGH (ref 7.350–7.450)
pH, Arterial: 7.535 — ABNORMAL HIGH (ref 7.350–7.450)
pH, Arterial: 7.458 — ABNORMAL HIGH (ref 7.350–7.450)
pH, Arterial: 7.356 (ref 7.350–7.450)
pH, Arterial: 7.358 (ref 7.350–7.450)
pO2, Arterial: 495 mmHg — ABNORMAL HIGH (ref 83.0–108.0)
pO2, Arterial: 473 mmHg — ABNORMAL HIGH (ref 83.0–108.0)
pO2, Arterial: 281 mmHg — ABNORMAL HIGH (ref 83.0–108.0)
pO2, Arterial: 77 mmHg — ABNORMAL LOW (ref 83.0–108.0)

## 2017-05-09 LAB — COMPREHENSIVE METABOLIC PANEL
ALK PHOS: 71 U/L (ref 38–126)
ALT: 31 U/L (ref 17–63)
AST: 24 U/L (ref 15–41)
Albumin: 3.4 g/dL — ABNORMAL LOW (ref 3.5–5.0)
Anion gap: 7 (ref 5–15)
BUN: 20 mg/dL (ref 6–20)
CALCIUM: 9 mg/dL (ref 8.9–10.3)
CO2: 28 mmol/L (ref 22–32)
CREATININE: 1.02 mg/dL (ref 0.61–1.24)
Chloride: 100 mmol/L — ABNORMAL LOW (ref 101–111)
GFR calc non Af Amer: >60 mL/min (ref >60)
Glucose, Bld: 105 mg/dL — ABNORMAL HIGH (ref 65–99)
Potassium: 4 mmol/L (ref 3.5–5.1)
SODIUM: 135 mmol/L (ref 135–145)
Total Bilirubin: 0.9 mg/dL (ref 0.3–1.2)
Total Protein: 6.6 g/dL (ref 6.5–8.1)

## 2017-05-09 LAB — POCT I-STAT 4, (NA,K, GLUC, HGB,HCT)
GLUCOSE: 108 mg/dL — AB (ref 65–99)
HCT: 33 % — ABNORMAL LOW (ref 39.0–52.0)
HEMOGLOBIN: 11.2 g/dL — AB (ref 13.0–17.0)
Potassium: 3.9 mmol/L (ref 3.5–5.1)
Sodium: 137 mmol/L (ref 135–145)

## 2017-05-09 LAB — BLOOD GAS, ARTERIAL
ACID-BASE EXCESS: 4.8 mmol/L — AB (ref 0.0–2.0)
BICARBONATE: 28.6 mmol/L — AB (ref 20.0–28.0)
DRAWN BY: 317771
FIO2: 0.21
O2 Saturation: 97.6 %
PCO2 ART: 40.9 mmHg (ref 32.0–48.0)
PH ART: 7.458 — AB (ref 7.350–7.450)
Patient temperature: 98.6
pO2, Arterial: 105 mmHg (ref 83.0–108.0)

## 2017-05-09 LAB — GLUCOSE, CAPILLARY
GLUCOSE-CAPILLARY: 111 mg/dL — AB (ref 65–99)
Glucose-Capillary: 120 mg/dL — ABNORMAL HIGH (ref 65–99)
Glucose-Capillary: 122 mg/dL — ABNORMAL HIGH (ref 65–99)
Glucose-Capillary: 124 mg/dL — ABNORMAL HIGH (ref 65–99)
Glucose-Capillary: 124 mg/dL — ABNORMAL HIGH (ref 65–99)
Glucose-Capillary: 131 mg/dL — ABNORMAL HIGH (ref 65–99)

## 2017-05-09 LAB — HEMOGLOBIN AND HEMATOCRIT, BLOOD
HCT: 32.7 % — ABNORMAL LOW (ref 39.0–52.0)
Hemoglobin: 11.1 g/dL — ABNORMAL LOW (ref 13.0–17.0)

## 2017-05-09 LAB — SURGICAL PCR SCREEN
MRSA, PCR: NEGATIVE
Staphylococcus aureus: NEGATIVE

## 2017-05-09 LAB — CREATININE, SERUM
CREATININE: 0.93 mg/dL (ref 0.61–1.24)
GFR calc Af Amer: >60 mL/min (ref >60)

## 2017-05-09 LAB — PROTIME-INR
INR: 1.42
PROTHROMBIN TIME: 17.5 s — AB (ref 11.4–15.2)

## 2017-05-09 LAB — MAGNESIUM: MAGNESIUM: 2.9 mg/dL — AB (ref 1.7–2.4)

## 2017-05-09 LAB — PLATELET COUNT: Platelets: 108 10*3/uL — ABNORMAL LOW (ref 150–400)

## 2017-05-09 LAB — APTT
aPTT: 31 seconds (ref 24–36)
aPTT: 34 seconds (ref 24–36)

## 2017-05-09 SURGERY — CORONARY ARTERY BYPASS GRAFTING (CABG)
Anesthesia: General | Site: Chest | Laterality: Right

## 2017-05-09 MED ORDER — NOREPINEPHRINE BITARTRATE 1 MG/ML IV SOLN
0.0000 ug/min | INTRAVENOUS | Status: DC
  Filled 2017-05-09: qty 16

## 2017-05-09 MED ORDER — FAMOTIDINE IN NACL 20-0.9 MG/50ML-% IV SOLN
20.0000 mg | Freq: Two times a day (BID) | INTRAVENOUS | Status: AC
  Administered 2017-05-09: 20 mg via INTRAVENOUS

## 2017-05-09 MED ORDER — CHLORHEXIDINE GLUCONATE 0.12 % MT SOLN
15.0000 mL | OROMUCOSAL | Status: AC
  Administered 2017-05-09: 15 mL via OROMUCOSAL

## 2017-05-09 MED ORDER — METOPROLOL TARTRATE 25 MG/10 ML ORAL SUSPENSION: 12.5000 mg | Freq: Two times a day (BID) | ORAL | Status: DC

## 2017-05-09 MED ORDER — MAGNESIUM SULFATE 4 GM/100ML IV SOLN
4.0000 g | Freq: Once | INTRAVENOUS | Status: AC
  Administered 2017-05-09: 4 g via INTRAVENOUS
  Filled 2017-05-09: qty 100

## 2017-05-09 MED ORDER — MILRINONE LACTATE IN DEXTROSE 20-5 MG/100ML-% IV SOLN
0.2500 ug/kg/min | INTRAVENOUS | Status: DC
  Administered 2017-05-10: 0.25 ug/kg/min via INTRAVENOUS
  Filled 2017-05-09: qty 100

## 2017-05-09 MED ORDER — ACETAMINOPHEN 160 MG/5ML PO SOLN
1000.0000 mg | Freq: Four times a day (QID) | ORAL | Status: AC
  Administered 2017-05-10 (×2): 1000 mg
  Filled 2017-05-09 (×2): qty 40.6

## 2017-05-09 MED ORDER — HEMOSTATIC AGENTS (NO CHARGE) OPTIME
TOPICAL | Status: DC | PRN
  Administered 2017-05-09: 1 via TOPICAL

## 2017-05-09 MED ORDER — ROCURONIUM BROMIDE 10 MG/ML (PF) SYRINGE
PREFILLED_SYRINGE | INTRAVENOUS | Status: AC
  Filled 2017-05-09: qty 25

## 2017-05-09 MED ORDER — OXYCODONE HCL 5 MG PO TABS
5.0000 mg | ORAL_TABLET | ORAL | Status: DC | PRN
  Administered 2017-05-10 (×2): 5 mg via ORAL
  Filled 2017-05-09 (×2): qty 1

## 2017-05-09 MED ORDER — LACTATED RINGERS IV SOLN: 500.0000 mL | Freq: Once | INTRAVENOUS | Status: DC | PRN

## 2017-05-09 MED ORDER — ACETAMINOPHEN 650 MG RE SUPP
650.0000 mg | Freq: Once | RECTAL | Status: AC
  Administered 2017-05-09: 650 mg via RECTAL

## 2017-05-09 MED ORDER — DOCUSATE SODIUM 100 MG PO CAPS
200.0000 mg | ORAL_CAPSULE | Freq: Every day | ORAL | Status: DC
  Administered 2017-05-10 – 2017-05-14 (×3): 200 mg via ORAL
  Filled 2017-05-09 (×4): qty 2

## 2017-05-09 MED ORDER — LACTATED RINGERS IV SOLN: INTRAVENOUS | Status: DC

## 2017-05-09 MED ORDER — ORAL CARE MOUTH RINSE
15.0000 mL | Freq: Four times a day (QID) | OROMUCOSAL | Status: DC
  Administered 2017-05-10 – 2017-05-13 (×10): 15 mL via OROMUCOSAL

## 2017-05-09 MED ORDER — SODIUM CHLORIDE 0.9 % IV SOLN: 250.0000 mL | INTRAVENOUS | Status: DC

## 2017-05-09 MED ORDER — LACTATED RINGERS IV SOLN
INTRAVENOUS | Status: DC | PRN
  Administered 2017-05-09 (×2): via INTRAVENOUS

## 2017-05-09 MED ORDER — SODIUM CHLORIDE 0.9 % IV SOLN: INTRAVENOUS | Status: DC

## 2017-05-09 MED ORDER — SODIUM CHLORIDE 0.9 % IV SOLN
INTRAVENOUS | Status: DC | PRN
  Administered 2017-05-09: 0.2 ug/kg/h via INTRAVENOUS

## 2017-05-09 MED ORDER — FENTANYL CITRATE (PF) 250 MCG/5ML IJ SOLN
INTRAMUSCULAR | Status: DC | PRN
  Administered 2017-05-09 (×2): 250 ug via INTRAVENOUS
  Administered 2017-05-09: 150 ug via INTRAVENOUS
  Administered 2017-05-09: 50 ug via INTRAVENOUS
  Administered 2017-05-09 (×5): 100 ug via INTRAVENOUS
  Administered 2017-05-09: 50 ug via INTRAVENOUS

## 2017-05-09 MED ORDER — MORPHINE SULFATE (PF) 2 MG/ML IV SOLN: 2.0000 mg | INTRAVENOUS | Status: DC | PRN

## 2017-05-09 MED ORDER — ALBUMIN HUMAN 5 % IV SOLN
INTRAVENOUS | Status: DC | PRN
  Administered 2017-05-09 (×2): via INTRAVENOUS

## 2017-05-09 MED ORDER — VANCOMYCIN HCL IN DEXTROSE 1-5 GM/200ML-% IV SOLN
1000.0000 mg | Freq: Once | INTRAVENOUS | Status: AC
  Administered 2017-05-09: 1000 mg via INTRAVENOUS
  Filled 2017-05-09: qty 200

## 2017-05-09 MED ORDER — VASOPRESSIN 20 UNIT/ML IV SOLN
INTRAVENOUS | Status: AC
  Filled 2017-05-09: qty 1

## 2017-05-09 MED ORDER — ISOSORBIDE MONONITRATE 15 MG HALF TABLET
15.0000 mg | ORAL_TABLET | Freq: Every day | ORAL | Status: DC
  Filled 2017-05-09: qty 1

## 2017-05-09 MED ORDER — SODIUM CHLORIDE 0.9% FLUSH: 3.0000 mL | INTRAVENOUS | Status: DC | PRN

## 2017-05-09 MED ORDER — METOPROLOL TARTRATE 12.5 MG HALF TABLET: 12.5000 mg | ORAL_TABLET | Freq: Two times a day (BID) | ORAL | Status: DC

## 2017-05-09 MED ORDER — PHENYLEPHRINE 40 MCG/ML (10ML) SYRINGE FOR IV PUSH (FOR BLOOD PRESSURE SUPPORT)
PREFILLED_SYRINGE | INTRAVENOUS | Status: AC
  Filled 2017-05-09: qty 30

## 2017-05-09 MED ORDER — ALBUMIN HUMAN 5 % IV SOLN
250.0000 mL | INTRAVENOUS | Status: AC | PRN
  Administered 2017-05-09: 250 mL via INTRAVENOUS

## 2017-05-09 MED ORDER — LACTATED RINGERS IV SOLN
INTRAVENOUS | Status: DC | PRN
  Administered 2017-05-09: 08:00:00 via INTRAVENOUS

## 2017-05-09 MED ORDER — LIDOCAINE 2% (20 MG/ML) 5 ML SYRINGE
INTRAMUSCULAR | Status: AC
  Filled 2017-05-09: qty 5

## 2017-05-09 MED ORDER — HEPARIN SODIUM (PORCINE) 1000 UNIT/ML IJ SOLN
INTRAMUSCULAR | Status: DC | PRN
  Administered 2017-05-09: 2 mL via INTRAVENOUS
  Administered 2017-05-09: 15 mL via INTRAVENOUS
  Administered 2017-05-09: 16 mL via INTRAVENOUS

## 2017-05-09 MED ORDER — SODIUM CHLORIDE 0.9 % IV SOLN
INTRAVENOUS | Status: DC
  Administered 2017-05-09: 1.2 [IU]/h via INTRAVENOUS
  Filled 2017-05-09: qty 1

## 2017-05-09 MED ORDER — ONDANSETRON HCL 4 MG/2ML IJ SOLN: 4.0000 mg | Freq: Four times a day (QID) | INTRAMUSCULAR | Status: DC | PRN

## 2017-05-09 MED ORDER — SODIUM CHLORIDE 0.9 % IJ SOLN
OROMUCOSAL | Status: DC | PRN
  Administered 2017-05-09 (×3): 4 mL via TOPICAL

## 2017-05-09 MED ORDER — SODIUM CHLORIDE 0.9 % IV SOLN
0.0000 ug/min | INTRAVENOUS | Status: DC
  Administered 2017-05-09: 10 ug/min via INTRAVENOUS
  Filled 2017-05-09: qty 2

## 2017-05-09 MED ORDER — INSULIN REGULAR BOLUS VIA INFUSION
0.0000 [IU] | Freq: Three times a day (TID) | INTRAVENOUS | Status: DC
  Filled 2017-05-09: qty 10

## 2017-05-09 MED ORDER — ASPIRIN 81 MG PO CHEW: 324.0000 mg | CHEWABLE_TABLET | Freq: Every day | ORAL | Status: DC

## 2017-05-09 MED ORDER — MORPHINE SULFATE (PF) 2 MG/ML IV SOLN: 1.0000 mg | INTRAVENOUS | Status: AC | PRN

## 2017-05-09 MED ORDER — TRAMADOL HCL 50 MG PO TABS
50.0000 mg | ORAL_TABLET | Freq: Four times a day (QID) | ORAL | Status: DC | PRN
  Administered 2017-05-10 – 2017-05-11 (×3): 50 mg via ORAL
  Filled 2017-05-09 (×3): qty 1

## 2017-05-09 MED ORDER — METOPROLOL TARTRATE 5 MG/5ML IV SOLN: 2.5000 mg | INTRAVENOUS | Status: DC | PRN

## 2017-05-09 MED ORDER — ACETAMINOPHEN 160 MG/5ML PO SOLN: 650.0000 mg | Freq: Once | ORAL | Status: AC

## 2017-05-09 MED ORDER — DEXTROSE 5 % IV SOLN
1.5000 g | Freq: Two times a day (BID) | INTRAVENOUS | Status: AC
  Administered 2017-05-09 – 2017-05-11 (×4): 1.5 g via INTRAVENOUS
  Filled 2017-05-09 (×4): qty 1.5

## 2017-05-09 MED ORDER — SODIUM CHLORIDE 0.9 % IV SOLN
0.0000 ug/kg/h | INTRAVENOUS | Status: DC
  Filled 2017-05-09: qty 2

## 2017-05-09 MED ORDER — MILRINONE LACTATE IN DEXTROSE 20-5 MG/100ML-% IV SOLN
0.1250 ug/kg/min | INTRAVENOUS | Status: DC
  Administered 2017-05-09: 0.25 ug/kg/min via INTRAVENOUS
  Filled 2017-05-09: qty 100

## 2017-05-09 MED ORDER — BISACODYL 10 MG RE SUPP: 10.0000 mg | Freq: Every day | RECTAL | Status: DC

## 2017-05-09 MED ORDER — PROPOFOL 10 MG/ML IV BOLUS
INTRAVENOUS | Status: DC | PRN
  Administered 2017-05-09: 100 mg via INTRAVENOUS

## 2017-05-09 MED ORDER — PROPOFOL 10 MG/ML IV BOLUS
INTRAVENOUS | Status: AC
  Filled 2017-05-09: qty 40

## 2017-05-09 MED ORDER — ROCURONIUM BROMIDE 10 MG/ML (PF) SYRINGE
PREFILLED_SYRINGE | INTRAVENOUS | Status: AC
  Filled 2017-05-09: qty 5

## 2017-05-09 MED ORDER — MIDAZOLAM HCL 10 MG/2ML IJ SOLN
INTRAMUSCULAR | Status: AC
  Filled 2017-05-09: qty 2

## 2017-05-09 MED ORDER — MIDAZOLAM HCL 5 MG/5ML IJ SOLN
INTRAMUSCULAR | Status: DC | PRN
  Administered 2017-05-09 (×2): 2 mg via INTRAVENOUS
  Administered 2017-05-09: 1 mg via INTRAVENOUS

## 2017-05-09 MED ORDER — SODIUM CHLORIDE 0.9 % IV SOLN
INTRAVENOUS | Status: DC | PRN
  Administered 2017-05-09: 1 [IU]/h via INTRAVENOUS

## 2017-05-09 MED ORDER — ASPIRIN EC 325 MG PO TBEC
325.0000 mg | DELAYED_RELEASE_TABLET | Freq: Every day | ORAL | Status: DC
  Administered 2017-05-10 – 2017-05-11 (×2): 325 mg via ORAL
  Filled 2017-05-09 (×2): qty 1

## 2017-05-09 MED ORDER — MORPHINE SULFATE (PF) 4 MG/ML IV SOLN
2.0000 mg | INTRAVENOUS | Status: DC | PRN
  Administered 2017-05-09 – 2017-05-11 (×3): 2 mg via INTRAVENOUS
  Filled 2017-05-09 (×3): qty 1

## 2017-05-09 MED ORDER — BISACODYL 5 MG PO TBEC
10.0000 mg | DELAYED_RELEASE_TABLET | Freq: Every day | ORAL | Status: DC
  Administered 2017-05-10 – 2017-05-14 (×3): 10 mg via ORAL
  Filled 2017-05-09 (×4): qty 2

## 2017-05-09 MED ORDER — CHLORHEXIDINE GLUCONATE 0.12% ORAL RINSE (MEDLINE KIT)
15.0000 mL | Freq: Two times a day (BID) | OROMUCOSAL | Status: DC
  Administered 2017-05-09 – 2017-05-13 (×8): 15 mL via OROMUCOSAL

## 2017-05-09 MED ORDER — 0.9 % SODIUM CHLORIDE (POUR BTL) OPTIME
TOPICAL | Status: DC | PRN
  Administered 2017-05-09: 5000 mL

## 2017-05-09 MED ORDER — POTASSIUM CHLORIDE 10 MEQ/50ML IV SOLN
10.0000 meq | INTRAVENOUS | Status: AC
  Administered 2017-05-09 (×3): 10 meq via INTRAVENOUS

## 2017-05-09 MED ORDER — SODIUM CHLORIDE 0.45 % IV SOLN
INTRAVENOUS | Status: DC | PRN
  Administered 2017-05-09: 16:00:00 via INTRAVENOUS

## 2017-05-09 MED ORDER — FENTANYL CITRATE (PF) 250 MCG/5ML IJ SOLN
INTRAMUSCULAR | Status: AC
  Filled 2017-05-09: qty 25

## 2017-05-09 MED ORDER — NITROGLYCERIN IN D5W 200-5 MCG/ML-% IV SOLN: 7.0000 ug/min | INTRAVENOUS | Status: AC

## 2017-05-09 MED ORDER — FAMOTIDINE 20 MG PO TABS
20.0000 mg | ORAL_TABLET | Freq: Two times a day (BID) | ORAL | Status: DC
  Administered 2017-05-11 – 2017-05-15 (×9): 20 mg via ORAL
  Filled 2017-05-09 (×9): qty 1

## 2017-05-09 MED ORDER — SODIUM CHLORIDE 0.9% FLUSH
3.0000 mL | Freq: Two times a day (BID) | INTRAVENOUS | Status: DC
  Administered 2017-05-10 – 2017-05-12 (×5): 3 mL via INTRAVENOUS

## 2017-05-09 MED ORDER — NITROGLYCERIN IN D5W 200-5 MCG/ML-% IV SOLN: 7.0000 ug/min | INTRAVENOUS | Status: DC

## 2017-05-09 MED ORDER — LACTATED RINGERS IV SOLN
INTRAVENOUS | Status: DC | PRN
  Administered 2017-05-09: 09:00:00 via INTRAVENOUS

## 2017-05-09 MED ORDER — SODIUM CHLORIDE 0.9 % IJ SOLN
INTRAMUSCULAR | Status: AC
  Filled 2017-05-09: qty 40

## 2017-05-09 MED ORDER — MIDAZOLAM HCL 2 MG/2ML IJ SOLN
2.0000 mg | INTRAMUSCULAR | Status: DC | PRN
  Administered 2017-05-09: 2 mg via INTRAVENOUS
  Filled 2017-05-09: qty 2

## 2017-05-09 MED ORDER — EPHEDRINE SULFATE 50 MG/ML IJ SOLN
INTRAMUSCULAR | Status: DC | PRN
  Administered 2017-05-09 (×2): 10 mg via INTRAVENOUS

## 2017-05-09 MED ORDER — HEPARIN SODIUM (PORCINE) 1000 UNIT/ML IJ SOLN
INTRAMUSCULAR | Status: AC
  Filled 2017-05-09: qty 1

## 2017-05-09 MED ORDER — ROCURONIUM BROMIDE 100 MG/10ML IV SOLN
INTRAVENOUS | Status: DC | PRN
Start: 2017-05-09 — End: 2017-05-09
  Administered 2017-05-09: 50 mg via INTRAVENOUS
  Administered 2017-05-09: 60 mg via INTRAVENOUS
  Administered 2017-05-09: 40 mg via INTRAVENOUS
  Administered 2017-05-09 (×2): 50 mg via INTRAVENOUS

## 2017-05-09 MED ORDER — ACETAMINOPHEN 500 MG PO TABS
1000.0000 mg | ORAL_TABLET | Freq: Four times a day (QID) | ORAL | Status: AC
  Administered 2017-05-10 – 2017-05-14 (×17): 1000 mg via ORAL
  Filled 2017-05-09 (×17): qty 2

## 2017-05-09 MED ORDER — PHENYLEPHRINE HCL 10 MG/ML IJ SOLN
INTRAMUSCULAR | Status: DC | PRN
  Administered 2017-05-09 (×2): 80 ug via INTRAVENOUS

## 2017-05-09 MED ORDER — PROTAMINE SULFATE 10 MG/ML IV SOLN
INTRAVENOUS | Status: DC | PRN
  Administered 2017-05-09: 30 mg via INTRAVENOUS
  Administered 2017-05-09 (×2): 50 mg via INTRAVENOUS
  Administered 2017-05-09: 30 mg via INTRAVENOUS
  Administered 2017-05-09: 40 mg via INTRAVENOUS
  Administered 2017-05-09 (×2): 50 mg via INTRAVENOUS

## 2017-05-09 MED ORDER — PHENYLEPHRINE HCL 10 MG/ML IJ SOLN
INTRAMUSCULAR | Status: DC | PRN
  Administered 2017-05-09: 20 ug/min via INTRAVENOUS

## 2017-05-09 MED FILL — Heparin Sodium (Porcine) Inj 1000 Unit/ML: INTRAMUSCULAR | Qty: 30 | Status: AC

## 2017-05-09 MED FILL — Potassium Chloride Inj 2 mEq/ML: INTRAVENOUS | Qty: 5 | Status: AC

## 2017-05-09 MED FILL — Magnesium Sulfate Inj 50%: INTRAMUSCULAR | Qty: 10 | Status: AC

## 2017-05-09 SURGICAL SUPPLY — 121 items
APPLIER CLIP 9.375 SM OPEN (CLIP)
BAG DECANTER FOR FLEXI CONT (MISCELLANEOUS) ×5 IMPLANT
BANDAGE ACE 4X5 VEL STRL LF (GAUZE/BANDAGES/DRESSINGS) ×5 IMPLANT
BANDAGE ACE 6X5 VEL STRL LF (GAUZE/BANDAGES/DRESSINGS) ×5 IMPLANT
BASKET HEART  (ORDER IN 25'S) (MISCELLANEOUS) ×1
BASKET HEART (ORDER IN 25'S) (MISCELLANEOUS) ×1
BASKET HEART (ORDER IN 25S) (MISCELLANEOUS) ×3 IMPLANT
BLADE STERNUM SYSTEM 6 (BLADE) ×5 IMPLANT
BLADE SURG 15 STRL LF DISP TIS (BLADE) IMPLANT
BLADE SURG 15 STRL SS (BLADE)
BNDG GAUZE ELAST 4 BULKY (GAUZE/BANDAGES/DRESSINGS) ×5 IMPLANT
CANISTER SUCT 3000ML PPV (MISCELLANEOUS) ×5 IMPLANT
CANNULA EZ GLIDE AORTIC 21FR (CANNULA) ×5 IMPLANT
CATH CPB KIT HENDRICKSON (MISCELLANEOUS) ×5 IMPLANT
CATH ROBINSON RED A/P 18FR (CATHETERS) ×10 IMPLANT
CATH THORACIC 36FR (CATHETERS) ×5 IMPLANT
CATH THORACIC 36FR RT ANG (CATHETERS) ×5 IMPLANT
CLIP APPLIE 9.375 SM OPEN (CLIP) IMPLANT
CLIP TI WIDE RED SMALL 24 (CLIP) ×5 IMPLANT
CLIP VESOCCLUDE MED 24/CT (CLIP) ×5 IMPLANT
CLIP VESOCCLUDE SM WIDE 24/CT (CLIP) ×35 IMPLANT
COVER MAYO STAND STRL (DRAPES) ×5 IMPLANT
COVER SURGICAL LIGHT HANDLE (MISCELLANEOUS) ×5 IMPLANT
CRADLE DONUT ADULT HEAD (MISCELLANEOUS) ×5 IMPLANT
DRAPE CARDIOVASCULAR INCISE (DRAPES) ×2
DRAPE EXTREMITY ABC'S (DRAPES) ×1
DRAPE EXTREMITY ABCS (DRAPES) ×4 IMPLANT
DRAPE HALF SHEET 40X57 (DRAPES) ×5 IMPLANT
DRAPE SLUSH/WARMER DISC (DRAPES) ×5 IMPLANT
DRAPE SRG 135X102X78XABS (DRAPES) ×3 IMPLANT
DRSG COVADERM 4X14 (GAUZE/BANDAGES/DRESSINGS) ×5 IMPLANT
ELECT REM PT RETURN 9FT ADLT (ELECTROSURGICAL) ×10
ELECTRODE REM PT RTRN 9FT ADLT (ELECTROSURGICAL) ×6 IMPLANT
FELT TEFLON 1X6 (MISCELLANEOUS) ×10 IMPLANT
GAUZE SPONGE 4X4 12PLY STRL (GAUZE/BANDAGES/DRESSINGS) ×10 IMPLANT
GAUZE SPONGE 4X4 12PLY STRL LF (GAUZE/BANDAGES/DRESSINGS) ×10 IMPLANT
GEL ULTRASOUND 20GR AQUASONIC (MISCELLANEOUS) IMPLANT
GLOVE BIO SURGEON STRL SZ 6.5 (GLOVE) ×24 IMPLANT
GLOVE BIO SURGEON STRL SZ7 (GLOVE) ×20 IMPLANT
GLOVE BIO SURGEONS STRL SZ 6.5 (GLOVE) ×6
GLOVE BIOGEL PI IND STRL 6.5 (GLOVE) ×24 IMPLANT
GLOVE BIOGEL PI IND STRL 7.0 (GLOVE) ×12 IMPLANT
GLOVE BIOGEL PI INDICATOR 6.5 (GLOVE) ×16
GLOVE BIOGEL PI INDICATOR 7.0 (GLOVE) ×8
GLOVE SURG SIGNA 7.5 PF LTX (GLOVE) ×15 IMPLANT
GOWN STRL REUS W/ TWL LRG LVL3 (GOWN DISPOSABLE) ×24 IMPLANT
GOWN STRL REUS W/ TWL XL LVL3 (GOWN DISPOSABLE) ×6 IMPLANT
GOWN STRL REUS W/TWL LRG LVL3 (GOWN DISPOSABLE) ×16
GOWN STRL REUS W/TWL XL LVL3 (GOWN DISPOSABLE) ×4
HARMONIC SHEARS 14CM COAG (MISCELLANEOUS) ×5 IMPLANT
HEMOSTAT POWDER SURGIFOAM 1G (HEMOSTASIS) ×15 IMPLANT
HEMOSTAT SURGICEL 2X14 (HEMOSTASIS) ×5 IMPLANT
INSERT FOGARTY XLG (MISCELLANEOUS) IMPLANT
IV CATH 22GX1 FEP (IV SOLUTION) ×5 IMPLANT
KIT BASIN OR (CUSTOM PROCEDURE TRAY) ×5 IMPLANT
KIT ROOM TURNOVER OR (KITS) ×5 IMPLANT
KIT SUCTION CATH 14FR (SUCTIONS) ×10 IMPLANT
KIT VASOVIEW HEMOPRO VH 3000 (KITS) ×5 IMPLANT
MARKER GRAFT CORONARY BYPASS (MISCELLANEOUS) ×15 IMPLANT
NS IRRIG 1000ML POUR BTL (IV SOLUTION) ×30 IMPLANT
PACK OPEN HEART (CUSTOM PROCEDURE TRAY) ×5 IMPLANT
PAD ARMBOARD 7.5X6 YLW CONV (MISCELLANEOUS) ×10 IMPLANT
PAD ELECT DEFIB RADIOL ZOLL (MISCELLANEOUS) ×5 IMPLANT
PENCIL BUTTON HOLSTER BLD 10FT (ELECTRODE) ×5 IMPLANT
PUNCH AORTIC ROTATE 4.0MM (MISCELLANEOUS) IMPLANT
PUNCH AORTIC ROTATE 4.5MM 8IN (MISCELLANEOUS) IMPLANT
PUNCH AORTIC ROTATE 5MM 8IN (MISCELLANEOUS) IMPLANT
SET CARDIOPLEGIA MPS 5001102 (MISCELLANEOUS) ×5 IMPLANT
SHEARS HARMONIC 9CM CVD (BLADE) ×5 IMPLANT
SPONGE LAP 18X18 X RAY DECT (DISPOSABLE) ×5 IMPLANT
SPONGE LAP 4X18 X RAY DECT (DISPOSABLE) ×5 IMPLANT
STAPLER VISISTAT 35W (STAPLE) ×5 IMPLANT
SUT BONE WAX W31G (SUTURE) ×5 IMPLANT
SUT ETHIBOND 2 0 SH (SUTURE) ×8
SUT ETHIBOND 2 0 SH 36X2 (SUTURE) ×12 IMPLANT
SUT MNCRL AB 4-0 PS2 18 (SUTURE) IMPLANT
SUT PROLENE 3 0 SH DA (SUTURE) ×5 IMPLANT
SUT PROLENE 4 0 RB 1 (SUTURE) ×6
SUT PROLENE 4 0 SH DA (SUTURE) IMPLANT
SUT PROLENE 4-0 RB1 .5 CRCL 36 (SUTURE) ×9 IMPLANT
SUT PROLENE 5 0 C 1 36 (SUTURE) ×10 IMPLANT
SUT PROLENE 6 0 C 1 30 (SUTURE) ×30 IMPLANT
SUT PROLENE 7 0 BV1 MDA (SUTURE) ×5 IMPLANT
SUT PROLENE 7.0 RB 3 (SUTURE) ×15 IMPLANT
SUT PROLENE 8 0 BV175 6 (SUTURE) ×30 IMPLANT
SUT SILK  1 MH (SUTURE) ×8
SUT SILK 1 MH (SUTURE) ×12 IMPLANT
SUT SILK 1 TIES 10X30 (SUTURE) ×5 IMPLANT
SUT SILK 2 0 SH CR/8 (SUTURE) ×10 IMPLANT
SUT SILK 2 0 TIES 10X30 (SUTURE) ×5 IMPLANT
SUT SILK 2 0 TIES 17X18 (SUTURE) ×2
SUT SILK 2-0 18XBRD TIE BLK (SUTURE) ×3 IMPLANT
SUT SILK 3 0 SH CR/8 (SUTURE) ×5 IMPLANT
SUT SILK 4 0 TIE 10X30 (SUTURE) ×10 IMPLANT
SUT STEEL 6MS V (SUTURE) ×5 IMPLANT
SUT STEEL STERNAL CCS#1 18IN (SUTURE) IMPLANT
SUT STEEL SZ 6 DBL 3X14 BALL (SUTURE) ×5 IMPLANT
SUT TEM PAC WIRE 2 0 SH (SUTURE) ×25 IMPLANT
SUT VIC AB 1 CTX 36 (SUTURE) ×8
SUT VIC AB 1 CTX36XBRD ANBCTR (SUTURE) ×12 IMPLANT
SUT VIC AB 2-0 CT1 27 (SUTURE) ×2
SUT VIC AB 2-0 CT1 TAPERPNT 27 (SUTURE) ×3 IMPLANT
SUT VIC AB 2-0 CTX 27 (SUTURE) ×10 IMPLANT
SUT VIC AB 3-0 SH 27 (SUTURE) ×2
SUT VIC AB 3-0 SH 27X BRD (SUTURE) ×3 IMPLANT
SUT VIC AB 3-0 X1 27 (SUTURE) ×10 IMPLANT
SUT VICRYL 4-0 PS2 18IN ABS (SUTURE) IMPLANT
SUTURE E-PAK OPEN HEART (SUTURE) IMPLANT
SYR 50ML SLIP (SYRINGE) IMPLANT
SYSTEM SAHARA CHEST DRAIN ATS (WOUND CARE) ×5 IMPLANT
TAPE CLOTH SURG 4X10 WHT LF (GAUZE/BANDAGES/DRESSINGS) ×5 IMPLANT
TAPE PAPER 2X10 WHT MICROPORE (GAUZE/BANDAGES/DRESSINGS) ×5 IMPLANT
TOWEL GREEN STERILE (TOWEL DISPOSABLE) ×5 IMPLANT
TOWEL GREEN STERILE FF (TOWEL DISPOSABLE) IMPLANT
TOWEL OR 17X24 6PK STRL BLUE (TOWEL DISPOSABLE) ×10 IMPLANT
TOWEL OR 17X26 10 PK STRL BLUE (TOWEL DISPOSABLE) ×10 IMPLANT
TRAY FOLEY SILVER 16FR TEMP (SET/KITS/TRAYS/PACK) ×5 IMPLANT
TUBE FEEDING 8FR 16IN STR KANG (MISCELLANEOUS) ×5 IMPLANT
TUBING INSUFFLATION (TUBING) ×5 IMPLANT
UNDERPAD 30X30 (UNDERPADS AND DIAPERS) ×5 IMPLANT
WATER STERILE IRR 1000ML POUR (IV SOLUTION) ×10 IMPLANT

## 2017-05-09 NOTE — Brief Op Note (Addendum)
04/27/2017 - 05/09/2017  2:07 PM  PATIENT:  Scott [name]  83 y.o. male  PRE-OPERATIVE DIAGNOSIS:  1. S/p NSTEMI 2.CAD   POST-OPERATIVE DIAGNOSIS:  1. S/p NSTEMI 2.CAD   PROCEDURE:  TRANSESOPHAGEAL ECHOCARDIOGRAM (TEE), MEDIAN STERNOTOMY for CORONARY ARTERY BYPASS GRAFTING (CABG) x 3 (LIMA to OM. FREE RIMA to RAMUS INTERMEDIATE, and RIGHT RADIAL ARTERY to PDA) via OPEN RIGHT RADIAL ARTERY and BILATERAL MAMMARY ARTERIES (FREE RIMA)  LIMA to OM1  FRIMA to Ramus  Right Radial to PDA  SURGEON:  Surgeon(s) and Role:    * Loreli Slot, MD - Primary  PHYSICIAN ASSISTANT: Doree Fudge PA-C  ANESTHESIA:   general  EBL:  Total I/O In: 2000 [I.V.:2000] Out: 875 [Urine:875]  DRAINS: Chest tubes placed in the mediastinal and pleural spaces   COUNTS CORRECT:  YES  PLAN OF CARE: Admit to inpatient   PATIENT DISPOSITION:  ICU - intubated and hemodynamically stable.   Delay start of Pharmacological VTE agent (>24hrs) due to surgical blood loss or risk of bleeding: yes  BASELINE WEIGHT: 85 kg  XC= 70 min CPB= 118 min Good conduits, PDA fair, Om1 and Ramus good Large area of calcification of anterior wall and apex

## 2017-05-09 NOTE — Procedures (Signed)
Extubation Procedure Note NIF -30 VC 950  Cuff leak + No stridor  Pt extubated to a 4 L [place], tolerating well  Patient Details:   Name: Jahaan Pavlock DOB: [birth year] MRN: [id number]   Airway Documentation:     Evaluation  O2 sats: stable throughout Complications: No apparent complications Patient did tolerate procedure well. Bilateral Breath Sounds: Rhonchi, Diminished   Yes  Georgia Duff 05/09/2017, 7:54 PM

## 2017-05-09 NOTE — Interval H&P Note (Signed)
History and Physical Interval Note:  No change in pulse ox signal with radial compression  05/09/2017 8:17 AM  Scott [name]  has presented today for surgery, with the diagnosis of CAD  The various methods of treatment have been discussed with the patient and family. After consideration of risks, benefits and other options for treatment, the patient has consented to  Procedure(s): CORONARY ARTERY BYPASS GRAFTING (CABG) (N/A) TRANSESOPHAGEAL ECHOCARDIOGRAM (TEE) (N/A) RADIAL ARTERY HARVEST (Right) as a surgical intervention .  The patient's history has been reviewed, patient examined, no change in status, stable for surgery.  I have reviewed the patient's chart and labs.  Questions were answered to the patient's satisfaction.     Loreli Slot

## 2017-05-09 NOTE — Progress Notes (Signed)
Patient ID: Scott [name], male   DOB: 10/13/1926, 83 y.o.   MRN: [id number] EVENING ROUNDS NOTE :     [address]       [place] [address]             [phone]                 Day of Surgery Procedure(s) (LRB): CORONARY ARTERY BYPASS GRAFTING (CABG) times 3 using the right radial artery and bilateral internal mammary arteries. (N/A) TRANSESOPHAGEAL ECHOCARDIOGRAM (TEE) (N/A) RADIAL ARTERY HARVEST (Right)  Total Length of Stay:  LOS: 12 days  BP 111/71 (BP Location: Right Arm)   Pulse 80   Temp (!) 97.5 F (36.4 C)   Resp 12   Ht 5' 10.5" (1.791 m)   Wt 187 lb (84.8 kg)   SpO2 100%   BMI 26.45 kg/m   .Intake/Output      07/31 0701 - 08/01 0700 08/01 0701 - 08/02 0700   P.O. 600    I.V. (mL/kg)  3531.7 (41.6)   Blood  407   NG/GT  30   IV Piggyback  750   Total Intake(mL/kg) 600 (7.1) 4718.7 (55.6)   Urine (mL/kg/hr) 1125 (0.6) 1625 (1.8)   Blood  1000   Chest Tube  120   Total Output 1125 2745   Net -525 +1973.7        Urine Occurrence 2 x      . sodium chloride 20 mL/hr at 05/09/17 1700  . [START ON 05/10/2017] sodium chloride    . sodium chloride 20 mL/hr at 05/09/17 1700  . albumin human    . cefUROXime (ZINACEF)  IV    . dexmedetomidine (PRECEDEX) IV infusion 0.5 mcg/kg/hr (05/09/17 1736)  . famotidine (PEPCID) IV Stopped (05/09/17 1633)  . insulin (NOVOLIN-R) infusion 1 Units/hr (05/09/17 1700)  . lactated ringers    . lactated ringers 20 mL/hr at 05/09/17 1700  . lactated ringers    . magnesium sulfate 4 g (05/09/17 1557)  . milrinone 0.25 mcg/kg/min (05/09/17 1715)  . nitroGLYCERIN 7 mcg/min (05/09/17 1700)  . phenylephrine (NEO-SYNEPHRINE) Adult infusion 20 mcg/min (05/09/17 1745)  . potassium chloride 10 mEq (05/09/17 1734)  . vancomycin       Lab Results  Component Value Date   WBC 13.3 (H) 05/09/2017   HGB 11.8 (L) 05/09/2017   HCT 34.9 (L) 05/09/2017   PLT 113 (L) 05/09/2017   GLUCOSE 108 (H) 05/09/2017   ALT 31  05/09/2017   AST 24 05/09/2017   NA 137 05/09/2017   K 3.9 05/09/2017   CL 97 (L) 05/09/2017   CREATININE 0.60 (L) 05/09/2017   BUN 17 05/09/2017   CO2 28 05/09/2017   TSH 0.713 04/27/2017   INR 1.42 05/09/2017   HGBA1C 5.5 04/27/2017   Still asleep, on vent Not bleeding On low dose milrinone    Delight Ovens MD  Beeper [phone] Office [phone] 05/09/2017 5:50 PM

## 2017-05-09 NOTE — Anesthesia Procedure Notes (Signed)
Procedure Name: Intubation Date/Time: 05/09/2017 8:58 AM Performed by: Ollen Bowl Pre-anesthesia Checklist: Patient identified, Emergency Drugs available, Suction available, Patient being monitored and Timeout performed Patient Re-evaluated:Patient Re-evaluated prior to induction Oxygen Delivery Method: Circle system utilized and Simple face mask Preoxygenation: Pre-oxygenation with 100% oxygen Induction Type: IV induction Ventilation: Mask ventilation without difficulty Laryngoscope Size: Mac and 3 Grade View: Grade II Tube type: Subglottic suction tube Tube size: 8.0 mm Number of attempts: 1 Airway Equipment and Method: Patient positioned with wedge pillow and Stylet Placement Confirmation: ETT inserted through vocal cords under direct vision,  positive ETCO2 and breath sounds checked- equal and bilateral Secured at: 22 cm Tube secured with: Tape Dental Injury: Teeth and Oropharynx as per pre-operative assessment

## 2017-05-09 NOTE — Op Note (Signed)
NAMESHELL, YANDOW NO.:  [id number]  MEDICAL RECORD NO.:  [id number]  LOCATION:  [id number]                        FACILITY:  [facility]  PHYSICIAN:  Salvatore Decent. Dorris Fetch, M.D.DATE OF BIRTH:  01/19/1927  DATE OF PROCEDURE:  05/09/2017 DATE OF DISCHARGE:                              OPERATIVE REPORT   PREOPERATIVE DIAGNOSIS:  Severe 3-vessel coronary artery disease, status post non-ST elevation myocardial infarction.  POSTOPERATIVE DIAGNOSIS:  Severe 3-vessel coronary artery disease, status post non-ST elevation myocardial infarction.  PROCEDURE:  Median sternotomy, extracorporeal circulation, coronary artery bypass grafting x3 (left internal mammary artery to OM 1, free right internal mammary artery to ramus intermedius, right radial artery to posterior descending).  SURGEON:  Salvatore Decent. Dorris Fetch, M.D.  ASSISTANT:  Doree Fudge, PA.  ANESTHESIA:  General.  FINDINGS:  Transesophageal echocardiography revealed mild AS, no significant mitral regurgitation.  Ejection fraction approximately 20% with calcification in the anterior wall and apex.  Conduits good quality; posterior descending fair quality; OM1 and ramus intermedius, good-quality targets.  CLINICAL NOTE:  Mr. Munyan is an 83 year old gentleman with longstanding history of coronary artery disease, who was visiting family in [place] when he had acute severe chest pain. He ruled in for myocardial infarction. At catheterization, he was found to have severe 3- vessel coronary artery disease not amenable to percutaneous intervention.  The patient was initially stabilized medically, but wished to proceed with definitive treatment during this hospitalization rather than trying to return home to [place].  The indications, risks, benefits and alternatives were discussed in detail with the patient.  He understood the high-risk nature of the procedure given his advanced age and poor left  ventricular function.  OPERATIVE NOTE:  Mr. Corron was brought to the preoperative holding area on May 09, 2017.  Anesthesia placed a Swan-Ganz catheter and an arterial blood pressure monitoring line.  He was taken to the operating room, anesthetized and intubated.  A Foley catheter was placed. Intravenous antibiotics were administered.  Transesophageal echocardiography was performed.  The chest, abdomen, legs and right arm were prepped and draped in usual sterile fashion.  A median sternotomy was performed.  The wound then was packed.  An incision was made over the volar aspect of the right wrist and the dissection was carried down to the fascia overlying the radial artery. This was divided along the tendon medially.  A short segment of the radial artery was mobilized.  Branches were divided with a Harmonic scalpel.  There was a good distal pulse with proximal occlusion confirming the results of the preoperative Allen's test and vascular studies.  Incision then was extended to below the antecubital fossa and the radial artery was harvested using the Harmonic scalpel.  It was a good- quality vessel.  Simultaneously with the radial artery harvest, the right internal mammary artery was harvested.  The internal mammary artery harvest was difficult due to the patient's stiff chest wall and deep intercostal spaces.  Both the mammary arteries were good-quality vessels.  All of the right radial was relatively short.  2000 units of heparin was administered during the vessel harvest.  After harvesting the right radial artery, the incision was closed in two layers.  The  hand and arm were wrapped and tucked to the patient's side. The left internal mammary artery then was harvested using standard technique as well.  The remainder of the full heparin dose was given prior to dividing the distal end of that vessel.  A sternal retractor was placed and gently opened over time, the pericardium  was opened.  The ascending aorta was inspected.  There was no significant atherosclerotic disease.  After confirming adequate anticoagulation with ACT measurement, the aorta was cannulated via concentric 2-0 Ethibond pledgeted pursestring sutures.  A dual stage venous cannula was placed via pursestring suture in the right atrial appendage.  Cardiopulmonary bypass initiated.  Flows were maintained per protocol.  The patient was cooled to 32 degrees Celsius.  The coronary arteries were inspected, and anastomotic sites were chosen. It should be noted there were significant adhesions of the pericardium to the anterior wall and apex, these were taken down sharply.  There was a large calcified mass in the anterior wall and apex.  The coronaries were inspected and anastomotic sites were chosen.  The conduits were inspected and cut to length.  The foam pad was placed in the pericardium to insulate the heart.  A temperature probe was placed in the myocardium laterally.  An antegrade cardioplegia cannula was placed in the ascending aorta.  The aorta was crossclamped.  The left ventricle was emptied via the aortic root vent.  Cardiac arrest then was achieved with combination of cold antegrade blood cardioplegia and topical iced saline.  1.5 L of cardioplegia was administered.  There was a rapid diastolic arrest and, ultimately, myocardial cooling to 10 degrees Celsius.  The distal end of the right radial artery graft was beveled and was anastomosed end-to-side to the posterior descending.  The posterior descending was a 1.5-mm vessel at the site of the anastomosis, but only accepted the 1-mm probe distally.  It was a fair-quality target.  An end- to-side anastomosis was performed with a running 8-0 Prolene suture, all anastomoses were probed proximally and distally at their completion to ensure patency.  At the completion of the radial graft, additional cardioplegia was administered antegrade,  there was good backbleeding from the vessel.  After giving an additional 500 mL of cardioplegia antegrade, the heart was elevated exposing the lateral wall.  The ramus intermedius was superficially intramyocardial.  It was a 1.5-mm good-quality target. The distal end of the free right mammary graft was beveled and anastomosed end-to-side with a running 8-0 Prolene suture.  While the heart was still elevated, the left internal mammary artery was brought through a window in the pericardium.  The distal end was beveled and it was anastomosed end-to-side to OM-1.  OM-1 was also a 1.5-mm good- quality target vessel.  This anastomosis was also performed with a running 8-0 Prolene suture.  Additional cardioplegia was administered, there was good backbleeding from the right mammary graft.  The cardioplegia cannula was removed from the ascending aorta.  The proximal anastomoses for the right radial and right mammary grafts were done to 4.0-mm punch aortotomies with running 7-0 Prolene sutures.  At the completion of final proximal anastomosis, the patient was placed in Trendelenburg position.  Lidocaine was administered.  The left ventricle and aortic root were de-aired and the aortic crossclamp was removed, total crossclamp time was 70 minutes.  The patient required two defibrillations and then was in a bradycardic rhythm thereafter.  While rewarming was completed, all proximal and distal anastomoses were inspected for hemostasis.  Epicardial pacing wires  were placed on the right ventricle and right atrium and DDD pacing was initiated at 80 beats per minute.  The patient was loaded with Milrinone and infusion was begun at 0.25 mcg/kg/minute.  When the patient had rewarmed to a core temperature of 37 degrees Celsius, he was weaned from cardiopulmonary bypass on the first attempt.  The total bypass time was 118 minutes.  The initial cardiac index was low, but with volume administration rose to  greater than 2 L/min/m2, he remained hemodynamically stable throughout the post-bypass period.  Post-bypass transesophageal echocardiography showed some improvement of left ventricular wall motion in the lateral wall compared to the preoperative study.  A test dose of protamine was administered and was well tolerated.  The atrial and aortic cannulae were removed.  The remainder of the protamine was administered without incident.  The chest was irrigated with warm saline.  Hemostasis was achieved.  Bilateral pleural tubes and the single mediastinal tube were placed through separate subcostal incisions and secured with #1 silk sutures.  The sternum was closed with combination of single and double heavy-gauge stainless steel wires.  The pectoralis fascia, subcutaneous tissue and skin were closed in standard fashion.  All sponge, needle and instrument counts were correct at the end of the procedure.  The patient was taken from the operating room to the Surgical Intensive Care Unit intubated and in good condition.     Salvatore Decent Dorris Fetch, M.D.     SCH/MEDQ  D:  05/09/2017  T:  05/09/2017  Job:  [id number]

## 2017-05-09 NOTE — Progress Notes (Signed)
  Echocardiogram Echocardiogram Transesophageal has been performed.  Scott [name] 05/09/2017, 10:04 AM

## 2017-05-09 NOTE — Anesthesia Procedure Notes (Signed)
Arterial Line Insertion Start/End8/10/2016 8:10 AM, 05/09/2017 8:25 AM Performed by: Shelton Silvas, HAYES, CHRISTINE T, CRNA  Patient location: Pre-op. Preanesthetic checklist: patient identified, IV checked, site marked, risks and benefits discussed, surgical consent, monitors and equipment checked, pre-op evaluation and timeout performed Patient sedated Left, radial was placed Catheter size: 20 G Hand hygiene performed  and maximum sterile barriers used  Allen's test indicative of satisfactory collateral circulation Attempts: 3 Procedure performed without using ultrasound guided technique. Following insertion, dressing applied and Biopatch. Post procedure assessment: normal  Patient tolerated the procedure well with no immediate complications.

## 2017-05-09 NOTE — H&P (View-Only) (Signed)
[address]       [place] [address]             [phone]        Melo Stauber [facility] Medical Record #[id number] Date of Birth: May 02, 1927  Referring: Dr. Herbie Baltimore Cardiologist in [place]: Dr. Sol Blazing Dennard Nip, [place])  Chief Complaint:    Chief Complaint  Patient presents with  . Shortness of Breath  Reason for consultation: Coronary artery disease  History of Present Illness:     This is an 83 year old Caucasian male with a past medical history of coronary artery disease (s/p MI, 1987, PCI with stent in 1999 and again in either 2013 or 2014),congestive heart failure,  obstructive sleep apnea, hypertension, hypothyroidism who had a sudden onset of extreme dyspnea and diaphoresis that occurred on 04/27/2017 after getting up to use the bathroom. The patient is here visiting family from [place], [place]. He had a CT scan of the chest which ruled out PE, but did show ectasia of the ascending thoracic aorta (4.3 cm in diameter), and calcifications of the AV. Initial Troponin was 0.03 but it did go as high as 37.52. EKG showed a flutter, PVCs, and Q waves in V1. He ruled in for a NSTEMI and was put on a Heparin drip. Marland Kitchen He underwent a cardiac catheterization by Dr. Allyson Sabal on 04/30/2017 and he was found to have significant 3 vessel coronary artery disease (please review catheterization results below). Dr. Dorris Fetch has been consulted for the consideration of coronary artery bypass grafting surgery. Currently, the patient has no shortness of breath or chest pain. Vital signs are stable.   Current Activity/ Functional Status: Patient is independent with mobility/ambulation, transfers, ADL's, IADL's.   Zubrod Score: At the time of surgery this patient's most appropriate activity status/level should be described as: []     0    Normal activity, no symptoms [x]     1    Restricted in physical strenuous activity but ambulatory, able to do out light work []     2    Ambulatory  and capable of self care, unable to do work activities, up and about more than 50%  Of the time                            []     3    Only limited self care, in bed greater than 50% of waking hours []     4    Completely disabled, no self care, confined to bed or chair []     5    Moribund  Past Medical History:  Diagnosis Date  . Angina pectoris (HCC)   . Chronic combined systolic and diastolic heart failure, NYHA class 2 (HCC) 2014   EF reportedly anywhere from 25-40%  . Coronary artery disease involving native coronary artery    Reportedly multivessel  . Essential hypertension   . Gout   . History of MI (myocardial infarction)   . Hyperlipemia   . Hyperlipidemia   . Hypothyroidism   . Ischemic dilated cardiomyopathy (HCC)   . Multi-vessel coronary artery stenosis   . Varicose veins of both lower extremities     Past Surgical History:  Procedure Laterality Date  . OPEN CHOLECYSTECTOMY    . LEFT HEART CATH AND CORONARY ANGIOGRAPHY N/A 04/30/2017   Procedure: Left Heart Cath and Coronary Angiography;  Surgeon: Runell Gess, MD;  Location: [facility] INVASIVE CV  LAB;  Service: Cardiovascular;  Laterality: N/A;  . VASCULAR SURGERY     Vein stripping on the left and vein ablation on the right     Social History  . Marital status: Married    Spouse name: N/A  . Number of children: 3   Occupational History  . Retired Acupuncturist   Social History Main Topics  . Smoking status: Never Smoker  . Smokeless tobacco: Never Used  . Alcohol use Yes     Comment: Social  . Drug use: No  . Sexual activity: No    Social History Narrative   Remarried, with current wife 1 year. Has 3 children with his former wife. Visiting from [place].   Family History: Problem Relation Age of Onset  . Heart disease Mother Deceased at age 78       Pacemaker  . Bladder Cancer Father   . Bladder Cancer Brother   . Hypertension Son     Allergies: No Known Allergies  Current  Facility-Administered Medications  Medication Dose Route Frequency Provider Last Rate Last Dose  . 0.9 %  sodium chloride infusion  250 mL Intravenous PRN Rama, Christina P, MD      . 0.9 %  sodium chloride infusion  250 mL Intravenous PRN Runell Gess, MD      . acetaminophen (TYLENOL) tablet 650 mg  650 mg Oral Q4H PRN Rama, Maryruth Bun, MD      . allopurinol (ZYLOPRIM) tablet 100 mg  100 mg Oral Daily Rama, Maryruth Bun, MD   100 mg at 05/02/17 0826  . aspirin EC tablet 81 mg  81 mg Oral Daily Jodelle Red, MD   81 mg at 05/02/17 0825  . atorvastatin (LIPITOR) tablet 80 mg  80 mg Oral q1800 Jodelle Red, MD   80 mg at 05/01/17 1720  . carvedilol (COREG) tablet 6.25 mg  6.25 mg Oral BID WC Rama, Maryruth Bun, MD   6.25 mg at 05/02/17 0826  . enalapril (VASOTEC) tablet 5 mg  5 mg Oral QHS Rama, Maryruth Bun, MD   5 mg at 05/01/17 2118  . fluticasone (CUTIVATE) 0.005 % ointment   Topical BID Rama, Maryruth Bun, MD      . heparin ADULT infusion 100 units/mL (25000 units/258mL sodium chloride 0.45%)  1,200 Units/hr Intravenous Continuous Carney, Gwenlyn Found, RPH 12 mL/hr at 05/02/17 0600 1,200 Units/hr at 05/02/17 0600  . hydrALAZINE (APRESOLINE) injection 10 mg  10 mg Intravenous Q4H PRN Runell Gess, MD      . levothyroxine (SYNTHROID, LEVOTHROID) tablet 150 mcg  150 mcg Oral QAC breakfast Rama, Maryruth Bun, MD   150 mcg at 05/02/17 0825  . morphine 2 MG/ML injection 2 mg  2 mg Intravenous Q1H PRN Runell Gess, MD      . multivitamin with minerals tablet 1 tablet  1 tablet Oral Daily Rama, Maryruth Bun, MD   1 tablet at 05/02/17 0825  . nitroGLYCERIN (NITROSTAT) SL tablet 0.4 mg  0.4 mg Sublingual Q5 min PRN Rama, Maryruth Bun, MD   0.4 mg at 04/28/17 1746  . ondansetron (ZOFRAN) injection 4 mg  4 mg Intravenous Q6H PRN Rama, Maryruth Bun, MD   4 mg at 04/29/17 7829  . sodium chloride flush (NS) 0.9 % injection 3 mL  3 mL Intravenous Q12H Rama, Maryruth Bun, MD   3 mL at  05/02/17 0828  . sodium chloride flush (NS) 0.9 % injection 3 mL  3 mL Intravenous PRN Rama, Trula Ore  P, MD      . sodium chloride flush (NS) 0.9 % injection 3 mL  3 mL Intravenous Q12H Runell Gess, MD      . sodium chloride flush (NS) 0.9 % injection 3 mL  3 mL Intravenous PRN Runell Gess, MD        Prescriptions Prior to Admission  Medication Sig Dispense Refill Last Dose  . allopurinol (ZYLOPRIM) 100 MG tablet Take 100 mg by mouth daily.   04/26/2017 at Unknown time  . aspirin 81 MG chewable tablet Chew 81 mg by mouth daily.   04/26/2017 at Unknown time  . CALCIUM CARBONATE PO Take 600 mg by mouth 2 (two) times daily.   04/26/2017 at Unknown time  . carvedilol (COREG) 6.25 MG tablet Take 6.25 mg by mouth 2 (two) times daily with a meal.   04/26/2017 at 9a  . Cholecalciferol (VITAMIN D-1000 MAX ST) 1000 units tablet Take 2,000 Units by mouth every morning.   04/26/2017 at Unknown time  . cyanocobalamin (TH VITAMIN B12) 100 MCG tablet Take 1 tablet by mouth daily. Take 1 tablet by mouth in the evening.   04/26/2017 at Unknown time  . enalapril (VASOTEC) 5 MG tablet Take 5 mg by mouth at bedtime.   04/26/2017 at Unknown time  . fluticasone (CUTIVATE) 0.05 % cream Apply topically 2 (two) times daily.   Past Week at Unknown time  . isosorbide mononitrate (IMDUR) 30 MG 24 hr tablet Take 30 mg by mouth daily.   04/27/2017 at Unknown time  . levothyroxine (SYNTHROID, LEVOTHROID) 150 MCG tablet Take 150 mcg by mouth daily before breakfast.   04/26/2017 at Unknown time  . Lutein 20 MG CAPS Take 1 capsule by mouth every morning.   04/26/2017 at Unknown time  . Multiple Vitamin (MULTIVITAMIN) tablet Take 1 tablet by mouth daily.   04/26/2017 at Unknown time  . nitroGLYCERIN (NITROSTAT) 0.4 MG SL tablet Place 0.4 mg under the tongue every 5 (five) minutes as needed for chest pain.   unknown  . pravastatin (PRAVACHOL) 40 MG tablet Take 40 mg by mouth at bedtime.   04/26/2017 at Unknown time  . Ubiquinol  100 MG CAPS Take 1 capsule by mouth every morning.   04/26/2017 at Unknown time    Family History  Problem Relation Age of Onset  . Heart disease Mother        Pacemaker  . Bladder Cancer Father   . Bladder Cancer Brother   . Hypertension Son      Review of Systems:     Cardiac Review of Systems: Y or N  Chest Pain [  N  ]  Resting SOB [ N  ]Exertional SOB  [Y  ]  Orthopnea [ N ]   Pedal Edema [  N ]    Palpitations [ N ] Syncope  [ N ]   Presyncope [   N]  General Review of Systems: [Y] = yes [  ]=no Constitional:  fatigue [ Y ]; nausea [ N ]; night sweats [ N ]; fever [ N ]; or chills [ N ]                                                                 Eye :  blurred vision Klaus.Mock  ]; diplopia [  N ]; Resp: cough [ N ];  wheezing[ N ];  hemoptysis[ N ];  GI:  vomiting[N  ];  ; melena[ N ];  hematochezia Klaus.Mock  ];  GU:  hematuria[ N ];    Heme/Lymph: bruising[  ];  bleeding[  ];  anemia[  ];  Neuro: TIA[N  ];   Stroke[N  ];    Endocrine: diabetes[ N ];  thyroid dysfunction[ Y ];    Physical Exam: BP 104/88 (BP Location: Right Arm)   Pulse 67   Temp 97.7 F (36.5 C) (Oral)   Resp (!) 21   Ht 5' 10.5" (1.791 m)   Wt 85.5 kg (188 lb 8 oz)   SpO2 98%   BMI 26.66 kg/m    General appearance: alert, cooperative and no distress Head: Normocephalic, without obvious abnormality, atraumatic Neck: no carotid bruit, no JVD and supple, symmetrical, trachea midline Resp: clear to auscultation bilaterally Cardio: RRR, no murmur GI: Soft, non tender, bowel sounds present, well healed cholecystectomy scar RUQ Extremities: varicose veins noted and Venous stasis changes bottome of extremeties, feet warm Neurologic: Grossly normal  Diagnostic Studies & Laboratory data:  Procedures  CLINICAL DATA:  Shortness of breath and chest pain  EXAM: PORTABLE CHEST 1 VIEW  COMPARISON:  None.  FINDINGS: There are extensive bilateral interstitial opacities. Cardiomediastinal contours are  normal. No pneumothorax or sizable pleural effusion.  IMPRESSION: Extensive bilateral interstitial opacities which may indicate moderate interstitial pulmonary edema. In the absence of prior studies for comparison, interstitial lung disease would be difficult to exclude.   Electronically Signed   By: Deatra Robinson M.D.   On: 04/27/2017 05:45  CT ANGIOGRAPHY CHEST WITH CONTRAST  TECHNIQUE: Multidetector CT imaging of the chest was performed using the standard protocol during bolus administration of intravenous contrast. Multiplanar CT image reconstructions and MIPs were obtained to evaluate the vascular anatomy.  CONTRAST:  100 mL of Isovue 370.  COMPARISON:  No priors.  FINDINGS: Cardiovascular: No filling defects in the pulmonary tail tree to suggest underlying pulmonary embolism. Heart size is mildly enlarged. There is no significant pericardial fluid, thickening or pericardial calcification. There is aortic atherosclerosis, as well as atherosclerosis of the great vessels of the mediastinum and the coronary arteries, including calcified atherosclerotic plaque in the left main, left anterior descending, left circumflex and right coronary arteries. Extensive thinning and dystrophic calcifications in the left ventricle involving anterior, anteroseptal and septal wall segments throughout the mid ventricle and apex compatible with extensive remottling and scarring following LAD territory myocardial infarction(s). Calcifications of the aortic valve. Ectasia of ascending thoracic aorta (4.3 cm in diameter).  Mediastinum/Nodes: No pathologically enlarged mediastinal or hilar lymph nodes. Esophagus is unremarkable in appearance. No axillary lymphadenopathy.  Lungs/Pleura: Small bilateral pleural effusions lying dependently. Widespread ground-glass attenuation and interlobular septal thickening throughout the lungs bilaterally is most compatible with a background of  interstitial pulmonary edema. No confluent consolidative airspace disease. No definite suspicious appearing pulmonary nodules or masses are noted.  Upper Abdomen: Aortic atherosclerosis. 4.1 cm exophytic simple cyst in the upper pole of the left kidney. Colonic diverticulosis. Status post cholecystectomy.  Musculoskeletal: There are no aggressive appearing lytic or blastic lesions noted in the visualized portions of the skeleton.  Review of the MIP images confirms the above findings.  IMPRESSION: 1. No evidence of pulmonary embolism. 2. There are findings suggestive of congestive heart failure, as discussed above. 3. Aortic atherosclerosis, in addition to left main  and 3 vessel coronary artery disease. In addition, there is evidence of prior distal LAD territory myocardial infarction(s) with extensive scarring in the left ventricle. 4. There are calcifications of the aortic valve. Echocardiographic correlation for evaluation of potential valvular dysfunction may be warranted if clinically indicated. 5. Ectasia of the ascending thoracic aorta (4.3 cm in diameter). Recommend annual imaging followup by CTA or MRA. This recommendation follows 2010 ACCF/AHA/AATS/ACR/ASA/SCA/SCAI/SIR/STS/SVM Guidelines for the Diagnosis and Management of Patients with Thoracic Aortic Disease. Circulation. 2010; 121: R604-V409. 6. Colonic diverticulosis.  Left Heart Cath and Coronary Angiography by Dr. Allyson Sabal:  Conclusion     Dist RCA lesion, 80 %stenosed.  Ost Cx lesion, 99 %stenosed.  Mid Cx lesion, 99 %stenosed.  Ramus lesion, 70 %stenosed.  Ost LAD to [facility] LAD lesion, 80 %stenosed.   Kmari Whitworth is a 83 y.o. male    [id number] LOCATION:  FACILITY: [facility]  PHYSICIAN: [name], M.D. 1927-09-20   DATE OF PROCEDURE:  04/30/2017     Recent Radiology Findings:   No results found.   I have independently reviewed the above radiologic studies.  Recent Lab Findings: Lab  Results  Component Value Date   WBC 6.7 05/02/2017   HGB 15.2 05/02/2017   HCT 46.3 05/02/2017   PLT 144 (L) 05/02/2017   GLUCOSE 108 (H) 05/02/2017   ALT 22 04/27/2017   AST 28 04/27/2017   NA 136 05/02/2017   K 3.5 05/02/2017   CL 101 05/02/2017   CREATININE 1.02 05/02/2017   BUN 21 (H) 05/02/2017   CO2 28 05/02/2017   TSH 0.713 04/27/2017   INR 0.87 04/29/2017   HGBA1C 5.5 04/27/2017   Assessment / Plan:   1. S/p NSTEMI-continue Heparin drip.  He has been given Plavix (appears last given on 05/01/2017) so we will wait for Plavix washout. He appears to be a candidate for coronary artery bypass grafting surgery. The next available OR date appears to be 05/09/2017. Also, he has had vein stripping on the left and vein ablation on the right. He is not diabetic, so could consider bilateral IMA's as well as radial artery if needed. Dr. Dorris Fetch to review catheterization and ultimately determine management. 2. Hypertension-on Coreg 6.25 mg bid and Enalapril 5 mg at hs. 3. OSA-on CPAP 4. CHF-has been diuresed. LVEF 30-35% 5. Hypothyroidism-on Levothyroxine 6. Hyperlipidemia-on Atorvastatin 80 mg at hs  I  spent 20 minutes counseling the patient face to face and 50% or more the  time was spent in counseling and coordination of care. The total time spent in the appointment was 60 minutes.    Doree Fudge PA-C 05/02/2017 10:22 AM  Patient seen and examined, agree with above. 83 yo man with known CAD with prior MI and previous stent x 2. Also history of CHF, OSA, hypothyroidism, hypertension and hyperlipidemia. Workup has reveraled severe 3 vessel CAD with severe LV dysfunction including calcified scar in the anterior, septal and apical walls. He has probable moderate AS as well with a calculated valve area of 0.8 cm2 by TTE.   He has had bilateral vein procedures and has significant varicosities. It is doubtful he has any usable vein in his legs but will vein map to see if there is  anything that might be a viable graft. We could potentially use the right radial and bilateral IMAs. His Allen's test on the right side is equivocal so it is not certain that is usable. Will see what the vascular lab studies reveal. He would be high risk  for surgery given his advanced age and severe LV dysfunction.  Medical therapy is always an option but he did have chest heaviness this AM despite being on IV heparin. If that continues it may force our hands. He could end up requiring a hybrid type of procedure.  I think it would be a good idea to get a TEE to better assess the aortic valve. That is not a decision I want to make "on the fly" in an elderly man who is already high risk.  Salvatore Decent Dorris Fetch, MD [facility] [phone]

## 2017-05-09 NOTE — Anesthesia Procedure Notes (Signed)
Central Venous Catheter Insertion Performed by: Oleta Mouse, anesthesiologist Start/End8/10/2016 8:11 AM, 05/09/2017 8:21 AM Patient location: Pre-op. Preanesthetic checklist: patient identified, IV checked, site marked, risks and benefits discussed, surgical consent, monitors and equipment checked, pre-op evaluation, timeout performed and anesthesia consent Lidocaine 1% used for infiltration and patient sedated Hand hygiene performed  and maximum sterile barriers used  Catheter size: 9 Fr Total catheter length 10. MAC introducer Procedure performed using ultrasound guided technique. Ultrasound Notes:anatomy identified, needle tip was noted to be adjacent to the nerve/plexus identified, no ultrasound evidence of intravascular and/or intraneural injection and image(s) printed for medical record Attempts: 1 Following insertion, line sutured and dressing applied. Post procedure assessment: blood return through all ports, free fluid flow and no air  Patient tolerated the procedure well with no immediate complications.

## 2017-05-09 NOTE — Transfer of Care (Signed)
Immediate Anesthesia Transfer of Care Note  Patient: Scott [name]  Procedure(s) Performed: Procedure(s): CORONARY ARTERY BYPASS GRAFTING (CABG) times 3 using the right radial artery and bilateral internal mammary arteries. (N/A) TRANSESOPHAGEAL ECHOCARDIOGRAM (TEE) (N/A) RADIAL ARTERY HARVEST (Right)  Patient Location: SICU  Anesthesia Type:General  Level of Consciousness: Patient remains intubated per anesthesia plan  Airway & Oxygen Therapy: Patient remains intubated per anesthesia plan and Patient placed on Ventilator (see vital sign flow sheet for setting)  Post-op Assessment: Report given to RN and Post -op Vital signs reviewed and stable  Post vital signs: Reviewed and stable  Last Vitals:  Vitals:   05/09/17 0520 05/09/17 1540  BP: 127/72   Pulse: 71 (!) 101  Resp: 18 13  Temp: (!) 36.3 C     Last Pain:  Vitals:   05/09/17 0520  TempSrc: Oral  PainSc:       Patients Stated Pain Goal: 0 (05/08/17 0855)  Complications: No apparent anesthesia complications

## 2017-05-09 NOTE — Anesthesia Procedure Notes (Signed)
Central Venous Catheter Insertion Performed by: Val Eagle, anesthesiologist Start/End8/10/2016 8:12 AM, 05/09/2017 8:24 AM Patient location: Pre-op. Preanesthetic checklist: patient identified, IV checked, site marked, risks and benefits discussed, surgical consent, monitors and equipment checked, pre-op evaluation, timeout performed and anesthesia consent Hand hygiene performed  and maximum sterile barriers used  PA cath was placed.Swan type:thermodilution Procedure performed without using ultrasound guided technique. Attempts: 1 Patient tolerated the procedure well with no immediate complications.

## 2017-05-09 NOTE — Progress Notes (Addendum)
RT NOTE:  Pt placed on Home CPAP unit per MD order. RT assessed machine/power cords for damage, no damage noted. 6L O2 bled into CPAP. Humidifier filled with sterile water. Pt comfortable at this time. RN aware. RT will monitor.

## 2017-05-10 ENCOUNTER — Inpatient Hospital Stay (HOSPITAL_COMMUNITY): Payer: Medicare (Managed Care)

## 2017-05-10 ENCOUNTER — Encounter (HOSPITAL_COMMUNITY): Payer: Self-pay | Admitting: Thoracic Surgery (Cardiothoracic Vascular Surgery)

## 2017-05-10 LAB — GLUCOSE, CAPILLARY
GLUCOSE-CAPILLARY: 131 mg/dL — AB (ref 65–99)
GLUCOSE-CAPILLARY: 132 mg/dL — AB (ref 65–99)
GLUCOSE-CAPILLARY: 133 mg/dL — AB (ref 65–99)
Glucose-Capillary: 101 mg/dL — ABNORMAL HIGH (ref 65–99)
Glucose-Capillary: 106 mg/dL — ABNORMAL HIGH (ref 65–99)
Glucose-Capillary: 109 mg/dL — ABNORMAL HIGH (ref 65–99)
Glucose-Capillary: 118 mg/dL — ABNORMAL HIGH (ref 65–99)
Glucose-Capillary: 128 mg/dL — ABNORMAL HIGH (ref 65–99)
Glucose-Capillary: 133 mg/dL — ABNORMAL HIGH (ref 65–99)
Glucose-Capillary: 134 mg/dL — ABNORMAL HIGH (ref 65–99)
Glucose-Capillary: 147 mg/dL — ABNORMAL HIGH (ref 65–99)
Glucose-Capillary: 151 mg/dL — ABNORMAL HIGH (ref 65–99)
Glucose-Capillary: 96 mg/dL (ref 65–99)

## 2017-05-10 LAB — CBC
HEMATOCRIT: 39.1 % (ref 39.0–52.0)
HEMATOCRIT: 40.9 % (ref 39.0–52.0)
HEMOGLOBIN: 12.8 g/dL — AB (ref 13.0–17.0)
Hemoglobin: 13.3 g/dL (ref 13.0–17.0)
MCH: 29 pg (ref 26.0–34.0)
MCH: 29.6 pg (ref 26.0–34.0)
MCHC: 32.7 g/dL (ref 30.0–36.0)
MCHC: 32.5 g/dL (ref 30.0–36.0)
MCV: 88.7 fL (ref 78.0–100.0)
MCV: 91.1 fL (ref 78.0–100.0)
PLATELETS: 136 10*3/uL — AB (ref 150–400)
PLATELETS: 140 10*3/uL — AB (ref 150–400)
RBC: 4.41 MIL/uL (ref 4.22–5.81)
RBC: 4.49 MIL/uL (ref 4.22–5.81)
RDW: 13.7 % (ref 11.5–15.5)
RDW: 14.3 % (ref 11.5–15.5)
WBC: 12.6 10*3/uL — AB (ref 4.0–10.5)
WBC: 13.6 10*3/uL — AB (ref 4.0–10.5)

## 2017-05-10 LAB — POCT I-STAT, CHEM 8
BUN: 16 mg/dL (ref 6–20)
CALCIUM ION: 1.15 mmol/L (ref 1.15–1.40)
CHLORIDE: 97 mmol/L — AB (ref 101–111)
Creatinine, Ser: 1.1 mg/dL (ref 0.61–1.24)
Glucose, Bld: 134 mg/dL — ABNORMAL HIGH (ref 65–99)
HEMATOCRIT: 42 % (ref 39.0–52.0)
Hemoglobin: 14.3 g/dL (ref 13.0–17.0)
Potassium: 4.7 mmol/L (ref 3.5–5.1)
SODIUM: 134 mmol/L — AB (ref 135–145)
TCO2: 25 mmol/L (ref 0–100)

## 2017-05-10 LAB — BASIC METABOLIC PANEL
Anion gap: 4 — ABNORMAL LOW (ref 5–15)
BUN: 13 mg/dL (ref 6–20)
CO2: 25 mmol/L (ref 22–32)
Calcium: 8.1 mg/dL — ABNORMAL LOW (ref 8.9–10.3)
Chloride: 104 mmol/L (ref 101–111)
Creatinine, Ser: 0.97 mg/dL (ref 0.61–1.24)
GFR calc Af Amer: >60 mL/min (ref >60)
GFR calc non Af Amer: >60 mL/min (ref >60)
Glucose, Bld: 104 mg/dL — ABNORMAL HIGH (ref 65–99)
Potassium: 4.6 mmol/L (ref 3.5–5.1)
Sodium: 133 mmol/L — ABNORMAL LOW (ref 135–145)

## 2017-05-10 LAB — CREATININE, SERUM
CREATININE: 1.18 mg/dL (ref 0.61–1.24)
GFR, EST NON AFRICAN AMERICAN: 54 mL/min — AB (ref >60)

## 2017-05-10 LAB — MAGNESIUM
MAGNESIUM: 2.5 mg/dL — AB (ref 1.7–2.4)
Magnesium: 2.7 mg/dL — ABNORMAL HIGH (ref 1.7–2.4)

## 2017-05-10 LAB — HEMOGLOBIN A1C
HEMOGLOBIN A1C: 5.7 % — AB (ref 4.8–5.6)
MEAN PLASMA GLUCOSE: 117 mg/dL

## 2017-05-10 MED ORDER — INSULIN DETEMIR 100 UNIT/ML ~~LOC~~ SOLN
20.0000 [IU] | Freq: Every day | SUBCUTANEOUS | Status: DC
  Administered 2017-05-10: 20 [IU] via SUBCUTANEOUS
  Filled 2017-05-10 (×3): qty 0.2

## 2017-05-10 MED ORDER — UBIQUINOL 100 MG PO CAPS: 1.0000 | ORAL_CAPSULE | Freq: Every morning | ORAL | Status: DC

## 2017-05-10 MED ORDER — INSULIN DETEMIR 100 UNIT/ML ~~LOC~~ SOLN: 20.0000 [IU] | Freq: Every day | SUBCUTANEOUS | Status: DC

## 2017-05-10 MED ORDER — INSULIN ASPART 100 UNIT/ML ~~LOC~~ SOLN
0.0000 [IU] | SUBCUTANEOUS | Status: DC
  Administered 2017-05-10 (×3): 2 [IU] via SUBCUTANEOUS

## 2017-05-10 MED ORDER — CARVEDILOL 6.25 MG PO TABS
6.2500 mg | ORAL_TABLET | Freq: Two times a day (BID) | ORAL | Status: DC
  Administered 2017-05-10 – 2017-05-12 (×5): 6.25 mg via ORAL
  Filled 2017-05-10 (×5): qty 1

## 2017-05-10 MED ORDER — INSULIN ASPART 100 UNIT/ML ~~LOC~~ SOLN
0.0000 [IU] | SUBCUTANEOUS | Status: DC
  Administered 2017-05-10 (×2): 2 [IU] via SUBCUTANEOUS

## 2017-05-10 MED ORDER — FUROSEMIDE 10 MG/ML IJ SOLN
40.0000 mg | Freq: Once | INTRAMUSCULAR | Status: AC
  Administered 2017-05-10: 40 mg via INTRAVENOUS
  Filled 2017-05-10: qty 4

## 2017-05-10 MED ORDER — LUTEIN 20 MG PO CAPS: 1.0000 | ORAL_CAPSULE | Freq: Every morning | ORAL | Status: DC

## 2017-05-10 MED ORDER — METOCLOPRAMIDE HCL 5 MG/ML IJ SOLN
10.0000 mg | Freq: Four times a day (QID) | INTRAMUSCULAR | Status: AC
  Administered 2017-05-10 (×4): 10 mg via INTRAVENOUS
  Filled 2017-05-10 (×4): qty 2

## 2017-05-10 MED ORDER — ISOSORBIDE MONONITRATE ER 30 MG PO TB24
30.0000 mg | ORAL_TABLET | Freq: Every day | ORAL | Status: DC
  Administered 2017-05-10 – 2017-05-15 (×6): 30 mg via ORAL
  Filled 2017-05-10 (×6): qty 1

## 2017-05-10 MED ORDER — ENOXAPARIN SODIUM 40 MG/0.4ML ~~LOC~~ SOLN
40.0000 mg | Freq: Every day | SUBCUTANEOUS | Status: DC
  Administered 2017-05-10 – 2017-05-13 (×4): 40 mg via SUBCUTANEOUS
  Filled 2017-05-10 (×4): qty 0.4

## 2017-05-10 MED FILL — Dexmedetomidine HCl in NaCl 0.9% IV Soln 400 MCG/100ML: INTRAVENOUS | Qty: 100 | Status: AC

## 2017-05-10 NOTE — Progress Notes (Signed)
[facility] BRIEF SICU PROGRESS NOTE  1 Day Post-Op  S/P Procedure(s) (LRB): CORONARY ARTERY BYPASS GRAFTING (CABG) times 3 using the right radial artery and bilateral internal mammary arteries. (N/A) TRANSESOPHAGEAL ECHOCARDIOGRAM (TEE) (N/A) RADIAL ARTERY HARVEST (Right)   Stable day NSR w/ stable BP Breathing comfortably w/ O2 sats 94% on 4 L/min UOP excellent Labs okay  Plan: Continue current plan  Purcell Nails, MD 05/10/2017 8:28 PM

## 2017-05-10 NOTE — Care Management Note (Signed)
Case Management Note  Patient Details  Name: Scott [name] MRN: [id number] Date of Birth: Feb 27, 1927  Subjective/Objective:   From home, Pt  ruled in for NSTEMI.. Cardiac Cath 7/23 - 3 vessel CAD. CVTS consulted. Plan for Palvix Washout and CABG 8/1. PTA Pt was traveling from [place]. POD 1 CABG weaning milrinone, diursis,                            Action/Plan: NCM will follow for dc needs.   Expected Discharge Date:                  Expected Discharge Plan:  Home w Home Health Services  In-House Referral:     Discharge planning Services  CM Consult  Post Acute Care Choice:    Choice offered to:     DME Arranged:    DME Agency:     HH Arranged:    HH Agency:     Status of Service:  In process, will continue to follow  If discussed at Long Length of Stay Meetings, dates discussed:    Additional Comments:  Leone Haven, RN 05/10/2017, 1:34 PM

## 2017-05-10 NOTE — Progress Notes (Signed)
1 Day Post-Op Procedure(s) (LRB): CORONARY ARTERY BYPASS GRAFTING (CABG) times 3 using the right radial artery and bilateral internal mammary arteries. (N/A) TRANSESOPHAGEAL ECHOCARDIOGRAM (TEE) (N/A) RADIAL ARTERY HARVEST (Right) Subjective: No complaints, denies nausea  Objective: Vital signs in last 24 hours: Temp:  [97 F (36.1 C)-99 F (37.2 C)] 98.8 F (37.1 C) (08/02 0800) Pulse Rate:  [68-101] 75 (08/02 0800) Cardiac Rhythm: Atrial paced (08/02 0400) Resp:  [11-29] 24 (08/02 0800) BP: (102-140)/(59-77) 126/62 (08/02 0800) SpO2:  [89 %-100 %] 95 % (08/02 0800) Arterial Line BP: (94-164)/(43-70) 145/57 (08/02 0800) FiO2 (%):  [40 %-50 %] 40 % (08/01 1757) Weight:  [199 lb 4.7 oz (90.4 kg)] 199 lb 4.7 oz (90.4 kg) (08/02 0500)  Hemodynamic parameters for last 24 hours: PAP: (20-56)/(10-31) 39/21 CO:  [3.6 L/min-6.9 L/min] 4.4 L/min CI:  [1.8 L/min/m2-3.4 L/min/m2] 2.2 L/min/m2  Intake/Output from previous day: 08/01 0701 - 08/02 0700 In: 6335.6 [I.V.:4548.6; Blood:407; NG/GT:30; IV Piggyback:1350] Out: 4600 [Urine:2820; Blood:1000; Chest Tube:780] Intake/Output this shift: Total I/O In: 38.5 [I.V.:38.5] Out: 85 [Urine:25; Chest Tube:60]  General appearance: alert, cooperative and no distress Neurologic: intact Heart: regular rate and rhythm Lungs: diminished breath sounds bibasilar Abdomen: mildly distended, nontender Extremities: well perfused, brisk cap refill  Lab Results:  Recent Labs  05/09/17 2210 05/10/17 0405  WBC 12.5* 12.6*  HGB 12.7* 12.8*  HCT 38.2* 39.1  PLT 131* 136*   BMET:  Recent Labs  05/09/17 0301  05/09/17 2205 05/09/17 2210 05/10/17 0405  NA 135  < > 135  --  133*  K 4.0  < > 4.7  --  4.6  CL 100*  < > 100*  --  104  CO2 28  --   --   --  25  GLUCOSE 105*  < > 130*  --  104*  BUN 20  < > 14  --  13  CREATININE 1.02  < > 0.80 0.93 0.97  CALCIUM 9.0  --   --   --  8.1*  < > = values in this interval not displayed.  PT/INR:   Recent Labs  05/09/17 1552  LABPROT 17.5*  INR 1.42   ABG    Component Value Date/Time   PHART 7.358 05/09/2017 2058   HCO3 25.0 05/09/2017 2058   TCO2 24 05/09/2017 2205   ACIDBASEDEF 1.0 05/09/2017 2058   O2SAT 94.0 05/09/2017 2058   CBG (last 3)   Recent Labs  05/10/17 0510 05/10/17 0611 05/10/17 0751  GLUCAP 96 101* 134*    Assessment/Plan: S/P Procedure(s) (LRB): CORONARY ARTERY BYPASS GRAFTING (CABG) times 3 using the right radial artery and bilateral internal mammary arteries. (N/A) TRANSESOPHAGEAL ECHOCARDIOGRAM (TEE) (N/A) RADIAL ARTERY HARVEST (Right) -  CV- stable hemodynamics- wean milrinone, dc swan  Continue ASA, statin, coreg  RESP- IS for atelectasis  RENAL- creatinine and lytes OK  Diurese  ENDO- CBG well controlled, change to levemir + SSI  GI- no nausea but gastric dil on CXR- clears only, reglan x 24 hours  DC mediastinal CT  SCD + enoxaparin for DVT prophylaxis   LOS: 13 days    Loreli Slot 05/10/2017

## 2017-05-10 NOTE — Anesthesia Postprocedure Evaluation (Signed)
Anesthesia Post Note  Patient: Khyle Winnick  Procedure(s) Performed: Procedure(s) (LRB): CORONARY ARTERY BYPASS GRAFTING (CABG) times 3 using the right radial artery and bilateral internal mammary arteries. (N/A) TRANSESOPHAGEAL ECHOCARDIOGRAM (TEE) (N/A) RADIAL ARTERY HARVEST (Right)     Patient location during evaluation: SICU Anesthesia Type: General Level of consciousness: awake Pain management: pain level controlled Vital Signs Assessment: post-procedure vital signs reviewed and stable Respiratory status: spontaneous breathing Cardiovascular status: stable Anesthetic complications: no Comments: Doing well. Tolerating home CPAP.     Last Vitals:  Vitals:   05/10/17 0700 05/10/17 0800  BP: (!) 109/59 126/62  Pulse: 79 75  Resp: (!) 23 (!) 24  Temp: 37.1 C 37.1 C    Last Pain:  Vitals:   05/10/17 0515  TempSrc:   PainSc: 4                  Shelton Silvas

## 2017-05-10 NOTE — Plan of Care (Signed)
Problem: Activity: Goal: Risk for activity intolerance will decrease Outcome: Progressing Pt to be up oob in AM once swan ganz pulled  Problem: Bowel/Gastric: Goal: Gastrointestinal status for postoperative course will improve Outcome: Progressing Tolerating sips and chips  Problem: Cardiac: Goal: Hemodynamic stability will improve Outcome: Progressing Within parameters on current gtts Goal: Ability to maintain an adequate cardiac output will improve Outcome: Progressing Within parameters Goal: Will show no signs and symptoms of excessive bleeding Outcome: Progressing Within parameters

## 2017-05-11 ENCOUNTER — Inpatient Hospital Stay (HOSPITAL_COMMUNITY): Payer: Medicare (Managed Care)

## 2017-05-11 LAB — CBC
HEMATOCRIT: 37.7 % — AB (ref 39.0–52.0)
HEMOGLOBIN: 12.7 g/dL — AB (ref 13.0–17.0)
MCH: 30.2 pg (ref 26.0–34.0)
MCHC: 33.7 g/dL (ref 30.0–36.0)
MCV: 89.8 fL (ref 78.0–100.0)
Platelets: 130 10*3/uL — ABNORMAL LOW (ref 150–400)
RBC: 4.2 MIL/uL — ABNORMAL LOW (ref 4.22–5.81)
RDW: 13.9 % (ref 11.5–15.5)
WBC: 14 10*3/uL — AB (ref 4.0–10.5)

## 2017-05-11 LAB — GLUCOSE, CAPILLARY
GLUCOSE-CAPILLARY: 98 mg/dL (ref 65–99)
GLUCOSE-CAPILLARY: 116 mg/dL — AB (ref 65–99)
Glucose-Capillary: 116 mg/dL — ABNORMAL HIGH (ref 65–99)
Glucose-Capillary: 119 mg/dL — ABNORMAL HIGH (ref 65–99)
Glucose-Capillary: 106 mg/dL — ABNORMAL HIGH (ref 65–99)

## 2017-05-11 LAB — BASIC METABOLIC PANEL
ANION GAP: 6 (ref 5–15)
BUN: 17 mg/dL (ref 6–20)
CALCIUM: 8.3 mg/dL — AB (ref 8.9–10.3)
CO2: 27 mmol/L (ref 22–32)
Chloride: 100 mmol/L — ABNORMAL LOW (ref 101–111)
Creatinine, Ser: 1.15 mg/dL (ref 0.61–1.24)
GFR calc Af Amer: >60 mL/min (ref >60)
GFR, EST NON AFRICAN AMERICAN: 55 mL/min — AB (ref >60)
Glucose, Bld: 93 mg/dL (ref 65–99)
POTASSIUM: 4.3 mmol/L (ref 3.5–5.1)
SODIUM: 133 mmol/L — AB (ref 135–145)

## 2017-05-11 MED ORDER — FUROSEMIDE 40 MG PO TABS
40.0000 mg | ORAL_TABLET | Freq: Every day | ORAL | Status: DC
  Administered 2017-05-11 – 2017-05-15 (×5): 40 mg via ORAL
  Filled 2017-05-11 (×5): qty 1

## 2017-05-11 MED ORDER — INSULIN ASPART 100 UNIT/ML ~~LOC~~ SOLN: 0.0000 [IU] | Freq: Three times a day (TID) | SUBCUTANEOUS | Status: DC

## 2017-05-11 MED ORDER — ENALAPRIL MALEATE 10 MG PO TABS
5.0000 mg | ORAL_TABLET | Freq: Every day | ORAL | Status: DC
  Administered 2017-05-11 – 2017-05-14 (×4): 5 mg via ORAL
  Filled 2017-05-11 (×5): qty 1

## 2017-05-11 MED FILL — Sodium Bicarbonate IV Soln 8.4%: INTRAVENOUS | Qty: 50 | Status: AC

## 2017-05-11 MED FILL — Heparin Sodium (Porcine) Inj 1000 Unit/ML: INTRAMUSCULAR | Qty: 20 | Status: AC

## 2017-05-11 MED FILL — Mannitol IV Soln 20%: INTRAVENOUS | Qty: 500 | Status: AC

## 2017-05-11 MED FILL — Electrolyte-R (PH 7.4) Solution: INTRAVENOUS | Qty: 4000 | Status: AC

## 2017-05-11 MED FILL — Lidocaine HCl IV Inj 20 MG/ML: INTRAVENOUS | Qty: 10 | Status: AC

## 2017-05-11 MED FILL — Sodium Chloride IV Soln 0.9%: INTRAVENOUS | Qty: 2000 | Status: AC

## 2017-05-11 NOTE — Progress Notes (Signed)
      [address]       [place] [address]             [phone]      POD # 2 CABG  BP (!) 105/92   Pulse 75   Temp 97.6 F (36.4 C) (Oral)   Resp 14   Ht 5' 10.5" (1.791 m)   Wt 192 lb 10.9 oz (87.4 kg)   SpO2 98%   BMI 27.26 kg/m    Intake/Output Summary (Last 24 hours) at 05/11/17 1900 Last data filed at 05/11/17 1000  Gross per 24 hour  Intake              473 ml  Output             1445 ml  Net             -972 ml   No PM labs CBG well controlled  Doing well POD # 2   Viviann Spare C. Dorris Fetch, MD [facility] [phone]

## 2017-05-11 NOTE — Evaluation (Signed)
Physical Therapy Evaluation Patient Details Name: Scott [name] MRN: [id number] DOB: 04-30-1927 Today's Date: 05/11/2017   History of Present Illness  Pt is an 83 y/o M s/p CABG x3. PMH includes CHF, CAD, angina, MI, HLD, and gout.   Clinical Impression  Upon arrival pt states he ambulated with nursing this am, but is agreeable to mobilize with PT. Pt able to state a couple of his sternal precautions correctly; however requires VCs to follow during mobilization. Pt also educated on all sternal precautions and given reinforcement during treatment. Pt able to ambulate unit without physical assist; however required VCs for gait, such as picking up feet to decrease shuffling. Attempted to ambulate on 4L O2; however pt's O2 desat to below 90% and put on 6L. Pt able to remain on 5L post ambulation with O2 sat above 90%. Pt's wife states that PTA pt was independent with all ADLs and did not use an AD for ambulation. Pt presents with deficits listed in PT problem list below and will benefit from continued acute therapy for mobilization, increased exercise tolerance, stair navigation and DME training, as well as strengthening and reinforcement of sternal precautions for safe discharge. At this time, appropriate plan is HHPT and pt and wife are agreeable to this plan.   Vitals:  Pre ambulation seated: BP 117/72 HR 72 SpO2 94% on 5L O2  Post ambulation seated: BP 116/75 HR 71 SpO2 94% on 5L O2    Follow Up Recommendations Home health PT;Supervision/Assistance - 24 hour    Equipment Recommendations  Rolling walker with 5" wheels    Recommendations for Other Services OT consult     Precautions / Restrictions Precautions Precautions: Sternal;Fall Precaution Comments: chest tube, pacer Restrictions Weight Bearing Restrictions: Yes (sternal precautions )      Mobility  Bed Mobility               General bed mobility comments: Pt in chair upon arrival   Transfers Overall transfer level:  Needs assistance   Transfers: Sit to/from Stand Sit to Stand: Min assist         General transfer comment: Min A to rise. VCs for hand placement on thighs to prevent pushing up from chair. cues for scooting to and from edge of chair  Ambulation/Gait Ambulation/Gait assistance: Min guard Ambulation Distance (Feet): 310 Feet Assistive device: Rolling walker (2 wheeled) Gait Pattern/deviations: Shuffle;Trunk flexed;Decreased stride length   Gait velocity interpretation: Below normal speed for age/gender General Gait Details: Pt with shuffled gait- wife states this was normal PTA- VCs to pick up LEs and take longer steps, cues for posture and position in RW  Stairs            Wheelchair Mobility    Modified Rankin (Stroke Patients Only)       Balance Overall balance assessment: Needs assistance Sitting-balance support: No upper extremity supported;Feet supported Sitting balance-Leahy Scale: Fair     Standing balance support: Bilateral upper extremity supported;During functional activity Standing balance-Leahy Scale: Poor Standing balance comment: Pt relies on RW for UE support during ambulation                              Pertinent Vitals/Pain Pain Assessment: No/denies pain    Home Living Family/patient expects to be discharged to:: Private residence Living Arrangements: Spouse/significant other Available Help at Discharge: Family Type of Home: House Home Access: Stairs to enter Entrance Stairs-Rails: None Entrance Stairs-Number of Steps: 4  Home Layout: One level Home Equipment: Bedside commode Additional Comments: Wife states they will be staying in [place] at her sister's home until September.     Prior Function Level of Independence: Independent               Hand Dominance        Extremity/Trunk Assessment   Upper Extremity Assessment Upper Extremity Assessment: Overall WFL for tasks assessed (Unable to formally assess due to sternal  precautions )    Lower Extremity Assessment Lower Extremity Assessment: Generalized weakness    Cervical / Trunk Assessment Cervical / Trunk Assessment: Kyphotic  Communication   Communication: No difficulties  Cognition Arousal/Alertness: Awake/alert Behavior During Therapy: WFL for tasks assessed/performed Overall Cognitive Status: Within Functional Limits for tasks assessed                                        General Comments      Exercises     Assessment/Plan    PT Assessment Patient needs continued PT services  PT Problem List Decreased strength;Decreased activity tolerance;Decreased mobility;Cardiopulmonary status limiting activity;Decreased balance       PT Treatment Interventions DME instruction;Gait training;Stair training;Functional mobility training;Therapeutic activities;Therapeutic exercise;Balance training;Patient/family education    PT Goals (Current goals can be found in the Care Plan section)  Acute Rehab PT Goals Patient Stated Goal: go home  PT Goal Formulation: With patient/family Time For Goal Achievement: 05/25/17 Potential to Achieve Goals: Good    Frequency Min 3X/week   Barriers to discharge Inaccessible home environment      Co-evaluation               AM-PAC PT "6 Clicks" Daily Activity  Outcome Measure Difficulty turning over in bed (including adjusting bedclothes, sheets and blankets)?: A Little Difficulty moving from lying on back to sitting on the side of the bed? : A Little Difficulty sitting down on and standing up from a chair with arms (e.g., wheelchair, bedside commode, etc,.)?: Total Help needed moving to and from a bed to chair (including a wheelchair)?: A Little Help needed walking in hospital room?: A Little Help needed climbing 3-5 steps with a railing? : A Lot 6 Click Score: 15    End of Session Equipment Utilized During Treatment: Gait belt;Oxygen Activity Tolerance: Patient tolerated  treatment well Patient left: in chair;with call bell/phone within reach;with family/visitor present Nurse Communication: Mobility status PT Visit Diagnosis: Other abnormalities of gait and mobility (R26.89);Difficulty in walking, not elsewhere classified (R26.2);Muscle weakness (generalized) (M62.81)    Time: 1610-9604 PT Time Calculation (min) (ACUTE ONLY): 32 min   Charges:   PT Evaluation $PT Eval Moderate Complexity: 1 Mod PT Treatments $Gait Training: 8-22 mins   PT G Codes:        Arneta Cliche, SPT Acute Rehab [phone]   Arneta Cliche 05/11/2017, 9:01 AM

## 2017-05-11 NOTE — Progress Notes (Signed)
2 Days Post-Op Procedure(s) (LRB): CORONARY ARTERY BYPASS GRAFTING (CABG) times 3 using the right radial artery and bilateral internal mammary arteries. (N/A) TRANSESOPHAGEAL ECHOCARDIOGRAM (TEE) (N/A) RADIAL ARTERY HARVEST (Right) Subjective: Some back pain, o/w feels well  Objective: Vital signs in last 24 hours: Temp:  [97.9 F (36.6 C)-99 F (37.2 C)] 97.9 F (36.6 C) (08/03 0400) Pulse Rate:  [69-75] 69 (08/03 0700) Cardiac Rhythm: Atrial paced (08/03 0030) Resp:  [16-28] 19 (08/03 0700) BP: (97-176)/(57-90) 117/72 (08/03 0700) SpO2:  [87 %-100 %] 94 % (08/03 0700) Arterial Line BP: (145-169)/(56-71) 169/71 (08/02 1000) Weight:  [192 lb 10.9 oz (87.4 kg)] 192 lb 10.9 oz (87.4 kg) (08/03 0500)  Hemodynamic parameters for last 24 hours: PAP: (34-45)/(18-29) 41/24 CO:  [3.8 L/min-4.4 L/min] 4.1 L/min CI:  [1.9 L/min/m2-2.2 L/min/m2] 2 L/min/m2  Intake/Output from previous day: 08/02 0701 - 08/03 0700 In: 677.8 [P.O.:240; I.V.:337.8; IV Piggyback:100] Out: 2837 [Urine:2167; Chest Tube:670] Intake/Output this shift: No intake/output data recorded.  General appearance: alert, cooperative and no distress Neurologic: intact Heart: regular rate and rhythm Lungs: diminished breath sounds bibasilar Abdomen: less distended, nontender, + BS  Lab Results:  Recent Labs  05/10/17 1624 05/10/17 1630 05/11/17 0352  WBC 13.6*  --  14.0*  HGB 13.3 14.3 12.7*  HCT 40.9 42.0 37.7*  PLT 140*  --  130*   BMET:  Recent Labs  05/10/17 0405  05/10/17 1630 05/11/17 0352  NA 133*  --  134* 133*  K 4.6  --  4.7 4.3  CL 104  --  97* 100*  CO2 25  --   --  27  GLUCOSE 104*  --  134* 93  BUN 13  --  16 17  CREATININE 0.97  < > 1.10 1.15  CALCIUM 8.1*  --   --  8.3*  < > = values in this interval not displayed.  PT/INR:  Recent Labs  05/09/17 1552  LABPROT 17.5*  INR 1.42   ABG    Component Value Date/Time   PHART 7.358 05/09/2017 2058   HCO3 25.0 05/09/2017 2058   TCO2 25 05/10/2017 1630   ACIDBASEDEF 1.0 05/09/2017 2058   O2SAT 94.0 05/09/2017 2058   CBG (last 3)   Recent Labs  05/10/17 2024 05/10/17 2335 05/11/17 0325  GLUCAP 132* 133* 116*    Assessment/Plan: S/P Procedure(s) (LRB): CORONARY ARTERY BYPASS GRAFTING (CABG) times 3 using the right radial artery and bilateral internal mammary arteries. (N/A) TRANSESOPHAGEAL ECHOCARDIOGRAM (TEE) (N/A) RADIAL ARTERY HARVEST (Right) -  CV- stable,   RESP- continue IS  RENAL- creatinine OK, diuresed well  Change to PO lasix  ENDO_ CBG well controlled, change to AC/HS  GI- less distended and BS improved- will advance diet  SCD + enoxaparin for DVT prophylaxis     LOS: 14 days    Loreli Slot 05/11/2017

## 2017-05-11 NOTE — Progress Notes (Signed)
RT placed patient on home unit CPAP. 4L O2 bleed in needed. Patient tolerating well at this time.

## 2017-05-11 NOTE — Progress Notes (Addendum)
Patient able to ambulate 295 ft.  C/o dizziness halfway, but able to continue.   HR remained in 70s, sats 97-100% on 6L nasal cannula.  RR 32-.35

## 2017-05-11 NOTE — Care Management Note (Signed)
Case Management Note  Patient Details  Name: Scott [name] MRN: [id number] Date of Birth: 16-Dec-1926  Subjective/Objective:    From home, Pt  ruled in for NSTEMI.. Cardiac Cath 7/23 - 3 vessel CAD. CVTS consulted. Plan for [facility] and CABG 8/1. PTA Pt was traveling from [place]. POD 1 CABGweaning milrinone, diursis.  8/3 1656 Letha Cape RN, BSN - per wife they will be staying in [place] with his sister at address [address] [place] [place] [address]- home phone [phone], until he gets better , then he will go back home.  Mrs [facility] cell phone is [phone].  Wife states they would like HHPT with [facility], referral made to [facility] for HHPT and rolling walker.  Patient most likely will dc next week. Will need HH orders for HHPT .                 Action/Plan: NCM will follow for dc needs.   Expected Discharge Date:                  Expected Discharge Plan:  Home w Home Health Services  In-House Referral:     Discharge planning Services  CM Consult  Post Acute Care Choice:  Durable Medical Equipment, Home Health Choice offered to:  Spouse  DME Arranged:  Walker rolling DME Agency:  [facility]  HH Arranged:  PT [facility] Agency:  [facility]  Status of Service:  Completed, signed off  If discussed at Long Length of Stay Meetings, dates discussed:    Additional Comments:  Leone [place], RN 05/11/2017, 4:56 PM

## 2017-05-12 ENCOUNTER — Inpatient Hospital Stay (HOSPITAL_COMMUNITY): Payer: Medicare (Managed Care)

## 2017-05-12 LAB — BASIC METABOLIC PANEL
ANION GAP: 7 (ref 5–15)
BUN: 26 mg/dL — ABNORMAL HIGH (ref 6–20)
CHLORIDE: 98 mmol/L — AB (ref 101–111)
CO2: 27 mmol/L (ref 22–32)
CREATININE: 1.04 mg/dL (ref 0.61–1.24)
Calcium: 8.1 mg/dL — ABNORMAL LOW (ref 8.9–10.3)
GFR calc non Af Amer: >60 mL/min (ref >60)
Glucose, Bld: 146 mg/dL — ABNORMAL HIGH (ref 65–99)
POTASSIUM: 3.8 mmol/L (ref 3.5–5.1)
SODIUM: 132 mmol/L — AB (ref 135–145)

## 2017-05-12 LAB — CBC
HCT: 35.1 % — ABNORMAL LOW (ref 39.0–52.0)
HEMOGLOBIN: 11.7 g/dL — AB (ref 13.0–17.0)
MCH: 29.7 pg (ref 26.0–34.0)
MCHC: 33.3 g/dL (ref 30.0–36.0)
MCV: 89.1 fL (ref 78.0–100.0)
Platelets: 132 10*3/uL — ABNORMAL LOW (ref 150–400)
RBC: 3.94 MIL/uL — AB (ref 4.22–5.81)
RDW: 13.7 % (ref 11.5–15.5)
WBC: 13.9 10*3/uL — AB (ref 4.0–10.5)

## 2017-05-12 LAB — GLUCOSE, CAPILLARY: GLUCOSE-CAPILLARY: 96 mg/dL (ref 65–99)

## 2017-05-12 MED ORDER — ASPIRIN 81 MG PO CHEW
81.0000 mg | CHEWABLE_TABLET | Freq: Every day | ORAL | Status: DC
  Administered 2017-05-12 – 2017-05-15 (×4): 81 mg via ORAL
  Filled 2017-05-12 (×3): qty 1

## 2017-05-12 MED ORDER — CARVEDILOL 3.125 MG PO TABS
3.1250 mg | ORAL_TABLET | Freq: Two times a day (BID) | ORAL | Status: DC
  Administered 2017-05-12 – 2017-05-13 (×2): 3.125 mg via ORAL
  Filled 2017-05-12 (×2): qty 1

## 2017-05-12 MED ORDER — AMIODARONE HCL 200 MG PO TABS
400.0000 mg | ORAL_TABLET | Freq: Two times a day (BID) | ORAL | Status: DC
  Administered 2017-05-12 – 2017-05-15 (×6): 400 mg via ORAL
  Filled 2017-05-12 (×6): qty 2

## 2017-05-12 MED ORDER — OXYCODONE HCL 5 MG PO TABS: 5.0000 mg | ORAL_TABLET | ORAL | Status: DC | PRN

## 2017-05-12 NOTE — Progress Notes (Signed)
Patient placed on Home CPAP unit and doing well. 2l O2 Bleed in and patient sats 98% and Hr 109. Patient resting and will call if any further assistance needed.

## 2017-05-12 NOTE — Progress Notes (Signed)
      [address]       [place] [address]             [phone]      Awaiting bed on 4E  In atrial fib with rate ~ 100  BP (!) 91/51   Pulse (!) 40   Temp 97.7 F (36.5 C) (Oral)   Resp (!) 22   Ht 5' 10.5" (1.791 m)   Wt 191 lb 6.4 oz (86.8 kg)   SpO2 100%   BMI 27.07 kg/m    Intake/Output Summary (Last 24 hours) at 05/12/17 1723 Last data filed at 05/12/17 1200  Gross per 24 hour  Intake              320 ml  Output              975 ml  Net             -655 ml    Will add PO amiodarone  Viviann Spare C. Dorris Fetch, MD [facility] [phone]

## 2017-05-12 NOTE — Progress Notes (Signed)
3 Days Post-Op Procedure(s) (LRB): CORONARY ARTERY BYPASS GRAFTING (CABG) times 3 using the right radial artery and bilateral internal mammary arteries. (N/A) TRANSESOPHAGEAL ECHOCARDIOGRAM (TEE) (N/A) RADIAL ARTERY HARVEST (Right) Subjective: Slept well last night, + BM, no complaints this AM  Objective: Vital signs in last 24 hours: Temp:  [97.6 F (36.4 C)-98.1 F (36.7 C)] 97.8 F (36.6 C) (08/04 0710) Pulse Rate:  [25-135] 62 (08/04 0710) Cardiac Rhythm: Atrial paced (08/03 1600) Resp:  [14-33] 20 (08/04 0710) BP: (92-129)/(51-92) 110/55 (08/04 0710) SpO2:  [69 %-100 %] 100 % (08/04 0710) Weight:  [191 lb 6.4 oz (86.8 kg)] 191 lb 6.4 oz (86.8 kg) (08/04 0653)  Hemodynamic parameters for last 24 hours:    Intake/Output from previous day: 08/03 0701 - 08/04 0700 In: 283 [P.O.:240; I.V.:43] Out: 630 [Urine:550; Chest Tube:80] Intake/Output this shift: No intake/output data recorded.  General appearance: alert, cooperative and no distress Neurologic: intact Heart: regular rate and rhythm Lungs: diminished breath sounds bibasilar Abdomen: normal findings: soft, non-tender Wound: dressings clean and dry  Lab Results:  Recent Labs  05/11/17 0352 05/12/17 0252  WBC 14.0* 13.9*  HGB 12.7* 11.7*  HCT 37.7* 35.1*  PLT 130* 132*   BMET:  Recent Labs  05/11/17 0352 05/12/17 0252  NA 133* 132*  K 4.3 3.8  CL 100* 98*  CO2 27 27  GLUCOSE 93 146*  BUN 17 26*  CREATININE 1.15 1.04  CALCIUM 8.3* 8.1*    PT/INR:  Recent Labs  05/09/17 1552  LABPROT 17.5*  INR 1.42   ABG    Component Value Date/Time   PHART 7.358 05/09/2017 2058   HCO3 25.0 05/09/2017 2058   TCO2 25 05/10/2017 1630   ACIDBASEDEF 1.0 05/09/2017 2058   O2SAT 94.0 05/09/2017 2058   CBG (last 3)   Recent Labs  05/11/17 1651 05/11/17 2116 05/12/17 0822  GLUCAP 116* 106* 96    Assessment/Plan: S/P Procedure(s) (LRB): CORONARY ARTERY BYPASS GRAFTING (CABG) times 3 using the right  radial artery and bilateral internal mammary arteries. (N/A) TRANSESOPHAGEAL ECHOCARDIOGRAM (TEE) (N/A) RADIAL ARTERY HARVEST (Right) Plan for transfer to step-down: see transfer orders  CV- in SR in 60s- change pacer to VVI, decrease coreg  RESP_ continue IS  RENAL- creatinine and lytes OK  PO lasix  Dc Foley  ENDO- CBG well controlled- dc CBG/ SSI  Deconditioning- continue PT   LOS: 15 days    Loreli Slot 05/12/2017

## 2017-05-13 ENCOUNTER — Inpatient Hospital Stay (HOSPITAL_COMMUNITY): Payer: Medicare (Managed Care)

## 2017-05-13 DIAGNOSIS — Z951 Presence of aortocoronary bypass graft: Secondary | ICD-10-CM

## 2017-05-13 LAB — BASIC METABOLIC PANEL
Anion gap: 5 (ref 5–15)
BUN: 35 mg/dL — ABNORMAL HIGH (ref 6–20)
CHLORIDE: 99 mmol/L — AB (ref 101–111)
CO2: 29 mmol/L (ref 22–32)
Calcium: 8.2 mg/dL — ABNORMAL LOW (ref 8.9–10.3)
Creatinine, Ser: 1.09 mg/dL (ref 0.61–1.24)
GFR calc Af Amer: >60 mL/min (ref >60)
GFR calc non Af Amer: 59 mL/min — ABNORMAL LOW (ref >60)
GLUCOSE: 116 mg/dL — AB (ref 65–99)
POTASSIUM: 3.9 mmol/L (ref 3.5–5.1)
Sodium: 133 mmol/L — ABNORMAL LOW (ref 135–145)

## 2017-05-13 LAB — CBC
HEMATOCRIT: 35.6 % — AB (ref 39.0–52.0)
HEMOGLOBIN: 11.7 g/dL — AB (ref 13.0–17.0)
MCH: 29.3 pg (ref 26.0–34.0)
MCHC: 32.9 g/dL (ref 30.0–36.0)
MCV: 89 fL (ref 78.0–100.0)
Platelets: 155 10*3/uL (ref 150–400)
RBC: 4 MIL/uL — AB (ref 4.22–5.81)
RDW: 13.7 % (ref 11.5–15.5)
WBC: 12.1 10*3/uL — ABNORMAL HIGH (ref 4.0–10.5)

## 2017-05-13 MED ORDER — SODIUM CHLORIDE 0.9% FLUSH: 3.0000 mL | INTRAVENOUS | Status: DC | PRN

## 2017-05-13 MED ORDER — SODIUM CHLORIDE 0.9% FLUSH
3.0000 mL | Freq: Two times a day (BID) | INTRAVENOUS | Status: DC
  Administered 2017-05-13 – 2017-05-14 (×3): 3 mL via INTRAVENOUS

## 2017-05-13 MED ORDER — MOVING RIGHT ALONG BOOK
Freq: Once | Status: AC
  Filled 2017-05-13: qty 1

## 2017-05-13 MED ORDER — ALUM & MAG HYDROXIDE-SIMETH 200-200-20 MG/5ML PO SUSP: 15.0000 mL | Freq: Four times a day (QID) | ORAL | Status: DC | PRN

## 2017-05-13 MED ORDER — CARVEDILOL 6.25 MG PO TABS
6.2500 mg | ORAL_TABLET | Freq: Two times a day (BID) | ORAL | Status: DC
Start: 2017-05-13 — End: 2017-05-15
  Administered 2017-05-13 – 2017-05-15 (×4): 6.25 mg via ORAL
  Filled 2017-05-13 (×4): qty 1

## 2017-05-13 MED ORDER — MAGNESIUM HYDROXIDE 400 MG/5ML PO SUSP: 30.0000 mL | Freq: Every day | ORAL | Status: DC | PRN

## 2017-05-13 MED ORDER — SODIUM CHLORIDE 0.9 % IV SOLN: 250.0000 mL | INTRAVENOUS | Status: DC | PRN

## 2017-05-13 NOTE — Progress Notes (Signed)
Pt ambulated 2100ft using front wheel walker.  Minimal assist, but tended to drift and needed reminding to not shuffle feet.

## 2017-05-13 NOTE — Progress Notes (Signed)
Respiratory Therapy treatment consult completed. Per RT protocol assessment score of 5, no new respiratory interventions are indicated at this time. RT will continue to monitor.

## 2017-05-13 NOTE — Progress Notes (Signed)
Pt states he is feeling very tired and weak today, declining second walk at this time.

## 2017-05-13 NOTE — Discharge Summary (Signed)
Physician Discharge Summary  Patient ID: Scott [name] MRN: [id number] DOB/AGE: Jul 12, 1927 83 y.o.  Admit date: 04/27/2017 Discharge date: 05/15/2017  Admission Diagnoses:  Patient Active Problem List   Diagnosis Date Noted  . Coronary artery disease 05/09/2017  . Coronary artery disease involving native coronary artery of native heart with unstable angina pectoris (HCC)   . Acute on chronic combined systolic and diastolic CHF (congestive heart failure) (HCC) 04/30/2017  . Aortic stenosis, mild-moderate 04/30/2017  . Acute respiratory failure with hypoxia (HCC) 04/27/2017  . Atrial flutter by electrocardiogram (HCC) 04/27/2017  . Hyperglycemia 04/27/2017  . NSTEMI (non-ST elevated myocardial infarction) (HCC) 04/27/2017  . Multi-vessel coronary artery stenosis   . Ischemic dilated cardiomyopathy (HCC)   . Hypothyroidism   . Hyperlipidemia with target low density lipoprotein (LDL) cholesterol less than 70 mg/dL   . History of MI (myocardial infarction)   . Gout   . Essential hypertension    Discharge Diagnoses:   Patient Active Problem List   Diagnosis Date Noted  . 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 Coronary artery disease 05/09/2017  . Coronary artery disease involving native coronary artery of native heart with unstable angina pectoris (HCC)   . Acute on chronic combined systolic and diastolic CHF (congestive heart failure) (HCC) 04/30/2017  . Aortic stenosis, mild-moderate 04/30/2017  . Acute respiratory failure with hypoxia (HCC) 04/27/2017  . Atrial flutter by electrocardiogram (HCC) 04/27/2017  . Hyperglycemia 04/27/2017  . NSTEMI (non-ST elevated myocardial infarction) (HCC) 04/27/2017  . Multi-vessel coronary artery stenosis   . Ischemic dilated cardiomyopathy (HCC)   . Hypothyroidism   . Hyperlipidemia with target low density lipoprotein (LDL) cholesterol less than 70 mg/dL   . History of MI (myocardial infarction)   . Gout   . Essential hypertension   Post op a  fib  Discharged Condition: good  History of Present Illness:  Scott [name] is an 83 yo white male with known CAD.  He suffered in MI in 1987 with subsequent PCI with stent placement in 1999 and 2013 or 2014.  He also has a history of CHF, OSA, Hypertension, Hypothyroidism.  He is here visiting from [place].  He developed sudden onset of extreme dyspnea and diaphoreses that occurred on 04/27/2017 after getting up to go to the bathroom.  He presented to the ED and had a CT scan of the chest which was negative for Pulmonary embolism.  There was however ectasia of the ascending aorta and calcifications of the Aortic Valve.  Initial Troponin level was normal, but peaked at 37.52.  EKG obtained showed atrial flutter, pvcs, and Q waves in lead V1.  He was ruled in for NSTEMI and started on Heparin.  Hospital Course:   He underwent cardiac catheterization by Dr. Allyson Sabal on 04/30/2017 and was found to have significant 3 vessel CAD.  It was felt bypass surgery would be indicated and TCTS consult was obtained.  He was evaluated by Dr. Dorris Fetch on 05/03/2017 at which time it was noted patient had received Plavix on 05/01/2017.  He was felt to be a candidate for coronary bypass but would require washout of Plavix prior to proceeding.  It was also noted patient had vein stripping from his left leg and vein ablation of his right leg.  He would require radial artery to be used for conduit as his Allens test was acceptable.  The risks and benefits of the procedure were explained to the patient and he was agreeable to proceed.  He was taken to  the operating room on 05/09/2017.  He underwent CABG x 3 utilizing LIMA to OM1, Free RIMA to Ramus Intermediate, and Right radial artery to PDA.  He tolerated the procedure without difficulty and was taken to the SICU in stable condition.  He was extubated the evening of surgery.  During his stay in the SICU he was weaned off Milrinone drip as tolerated.  His chest tubes and arterial lines  were removed without difficulty.  He was diuresed with IV lasix for volume overload.  He was bradycardic and required back up pacing.  His Coreg dose was decreased due to this.  He then developed rate controlled atrial fibrillation and was started on oral Amiodarone.  He was felt medically stable for transfer to the stepdown unit on 05/12/2017.  He remains in rate controlled Atrial fibrillation.  His coreg dose was titrated as tolerated.  We have asked cardiology for a recommendation regarding anticoagulation as patient remains in a fib with a controlled ventricular rate.  He is ambulating with rolling walker.  Home PT has been arranged.  He is medically stable for discharge home today.  Consults: cardiology  Significant Diagnostic Studies: angiography:    Dist RCA lesion, 80 %stenosed.  Ost Cx lesion, 99 %stenosed.  Mid Cx lesion, 99 %stenosed.  Ramus lesion, 70 %stenosed.  Ost LAD to Dist LAD lesion, 80 %stenosed  Treatments: surgery:   Median sternotomy, extracorporeal circulation, coronary artery bypass grafting x3 (left internal mammary artery to OM 1, free right internal mammary artery to ramus intermedius, right radial artery to posterior descending).  Discharge Exam: Blood pressure 96/63, pulse 89, temperature 98.5 F (36.9 C), temperature source Axillary, resp. rate (!) 22, height 5' 10.5" (1.791 m), weight 188 lb 11.2 oz (85.6 kg), SpO2 100 %.  Cardiovascular: Scott [name] Pulmonary: Clear to auscultation bilaterall Abdomen: Soft, non tender, bowel sounds present. Extremities: Mild bilateral lower extremity edema. Motor/sensory intact right forearm. Wounds: Clean and dry.  No erythema or signs of infection.  Disposition: Home  Discharge Instructions    AMB Referral to Cardiac Rehabilitation - Phase II    Complete by:  As directed    Diagnosis:  NSTEMI     Discharge Medications:  The patient has been discharged on:   1.Beta Blocker:  Yes [  x ]                               No   [   ]                              If No, reason:  2.Ace Inhibitor/ARB: Yes [x   ]                                     No  [    ]                                     If No, reason:  3.Statin:   Yes [ x  ]                  No  [   ]  If No, reason:  4.Ecasa:  Yes  [ x  ]                  No   [   ]                  If No, reason:     Allergies as of 05/15/2017      Reactions   Nifedipine    UNSPECIFIED REACTION    Rabeprazole Sodium Nausea Only      Medication List    TAKE these medications   acetaminophen 500 MG tablet Commonly known as:  TYLENOL Take 2 tablets (1,000 mg total) by mouth every 6 (six) hours as needed.   allopurinol 100 MG tablet Commonly known as:  ZYLOPRIM Take 100 mg by mouth daily.   amiodarone 400 MG tablet Commonly known as:  PACERONE Take 1 tablet (400 mg total) by mouth 2 (two) times daily. x7 days then 200 mg (1/2 tab) two times a day x7 days, then 200 mg (1/2) daily   apixaban 5 MG Tabs tablet Commonly known as:  ELIQUIS Take 1 tablet (5 mg total) by mouth 2 (two) times daily.   aspirin 81 MG chewable tablet Chew 81 mg by mouth daily.   CALCIUM CARBONATE PO Take 600 mg by mouth 2 (two) times daily.   carvedilol 6.25 MG tablet Commonly known as:  COREG Take 6.25 mg by mouth 2 (two) times daily with a meal.   enalapril 5 MG tablet Commonly known as:  VASOTEC Take 5 mg by mouth at bedtime.   fluticasone 0.05 % cream Commonly known as:  CUTIVATE Apply topically 2 (two) times daily.   furosemide 40 MG tablet Commonly known as:  LASIX Take 1 tablet (40 mg total) by mouth daily. For 7 days   isosorbide mononitrate 30 MG 24 hr tablet Commonly known as:  IMDUR Take 30 mg by mouth daily.   levothyroxine 150 MCG tablet Commonly known as:  SYNTHROID, LEVOTHROID Take 150 mcg by mouth daily before breakfast.   Lutein 20 MG Caps Take 1 capsule by mouth every morning.   multivitamin tablet Take 1 tablet by  mouth daily.   nitroGLYCERIN 0.4 MG SL tablet Commonly known as:  NITROSTAT Place 0.4 mg under the tongue every 5 (five) minutes as needed for chest pain.   Potassium Chloride ER 20 MEQ Tbcr Take 20 mEq by mouth daily.   pravastatin 40 MG tablet Commonly known as:  PRAVACHOL Take 40 mg by mouth at bedtime.   TH VITAMIN B12 100 MCG tablet Generic drug:  cyanocobalamin Take 1 tablet by mouth daily. Take 1 tablet by mouth in the evening.   traMADol 50 MG tablet Commonly known as:  ULTRAM Take 1 tablet (50 mg total) by mouth every 6 (six) hours as needed for moderate pain.   Ubiquinol 100 MG Caps Take 1 capsule by mouth every morning.   VITAMIN D-1000 MAX ST 1000 units tablet Generic drug:  Cholecalciferol Take 2,000 Units by mouth every morning.            Durable Medical Equipment        Start     Ordered   05/11/17 1654  For home use only DME Walker rolling  Once    Question:  Patient needs a walker to treat with the following condition  Answer:  Weakness   05/11/17 1654     Follow-up Information    Loreli Slot, MD.  Go on 06/12/2017.   Specialty:  Cardiothoracic Surgery Why:  PA/LAT CXR to be taken (at [facility] which is in the same building as Dr. Sunday Corn office on the ground floor) on 06/12/2017 at 10:00 am;Appointment time is at 10:30 am Contact information: [address] [place] [address] [phone]        [facility] [place]. Go on 05/23/2017.   Specialty:  Cardiothoracic Surgery Why:  For staple removal at 10:00 am Contact information: [address] [place], [address] [place] [address] [phone]       [facility] Follow up.   Why:  HHPT arranged - they will call you to set up home visits- please allow 24-48 hr post discharge.  Contact information: [address] [place] [address] [phone]        [facility], [place]. Go on 06/04/2017.   Specialty:  Cardiology Why:  @ 3:30pm. please arrive at least 10 minutes early Contact information: [address] [place] [address] [phone]           Signed: Lowella Dandy 05/15/2017, 8:24 AM

## 2017-05-13 NOTE — Progress Notes (Addendum)
      [address]       [place] [address]             [phone]      4 Days Post-Op Procedure(s) (LRB): CORONARY ARTERY BYPASS GRAFTING (CABG) times 3 using the right radial artery and bilateral internal mammary arteries. (N/A) TRANSESOPHAGEAL ECHOCARDIOGRAM (TEE) (N/A) RADIAL ARTERY HARVEST (Right)   Subjective:  No complaints.  Doing pretty good.  + ambulation  + BM  Objective: Vital signs in last 24 hours: Temp:  [96.9 F (36.1 C)-98 F (36.7 C)] 97.8 F (36.6 C) (08/05 0800) Pulse Rate:  [40-96] 94 (08/04 2221) Cardiac Rhythm: Atrial fibrillation (08/04 2000) Resp:  [20-26] 20 (08/04 2221) BP: (91-110)/(51-83) 98/62 (08/05 0447) SpO2:  [97 %-100 %] 98 % (08/04 2221) Weight:  [190 lb 11.2 oz (86.5 kg)] 190 lb 11.2 oz (86.5 kg) (08/05 0447)  Intake/Output from previous day: 08/04 0701 - 08/05 0700 In: 680 [P.O.:680] Out: 675 [Urine:675]  General appearance: alert, cooperative and no distress Heart: irregularly irregular rhythm Lungs: diminished breath sounds left base Abdomen: soft, non-tender; bowel sounds normal; no masses,  no organomegaly Extremities: edema trace Wound: clean and dry  Lab Results:  Recent Labs  05/12/17 0252 05/13/17 0312  WBC 13.9* 12.1*  HGB 11.7* 11.7*  HCT 35.1* 35.6*  PLT 132* 155   BMET:  Recent Labs  05/12/17 0252 05/13/17 0312  NA 132* 133*  K 3.8 3.9  CL 98* 99*  CO2 27 29  GLUCOSE 146* 116*  BUN 26* 35*  CREATININE 1.04 1.09  CALCIUM 8.1* 8.2*    PT/INR: No results for input(s): LABPROT, INR in the last 72 hours. ABG    Component Value Date/Time   PHART 7.358 05/09/2017 2058   HCO3 25.0 05/09/2017 2058   TCO2 25 05/10/2017 1630   ACIDBASEDEF 1.0 05/09/2017 2058   O2SAT 94.0 05/09/2017 2058   CBG (last 3)   Recent Labs  05/11/17 1651 05/11/17 2116 05/12/17 0822  GLUCAP 116* 106* 96    Assessment/Plan: S/P Procedure(s) (LRB): CORONARY ARTERY BYPASS GRAFTING (CABG) times 3 using  the right radial artery and bilateral internal mammary arteries. (N/A) TRANSESOPHAGEAL ECHOCARDIOGRAM (TEE) (N/A) RADIAL ARTERY HARVEST (Right)  1. CV- A. Fib, rate in the 90-100, BP is labile- continue Amiodarone 400 mg BID, Coreg at 3.125 mg BID, Enalapril.. Patient likely not a good candidate for coumadin due to advanced age and associated fall risk 2. Pulm- no issues, off oxygen, CXR with questionable small left pleural effusion vs. Atelectasis 3. Renal- creatinine stable, weight is stable, continue Lasix 4. Expected post operative blood loss anemia, mild Hgb at 11.7 5. Dispo- patient with rate controlled, A. Fib, bradycardic before that, will discuss removal of EPW today, continue lasix, continue current care   LOS: 16 days    BARRETT, ERIN 05/13/2017 Still in atrial fibrillation, will increase coreg back to his baseline dose Keep EPW until tomorrow  Viviann Spare C. Dorris Fetch, MD [facility] [phone]

## 2017-05-14 DIAGNOSIS — I481 Persistent atrial fibrillation: Secondary | ICD-10-CM

## 2017-05-14 MED ORDER — APIXABAN 5 MG PO TABS
5.0000 mg | ORAL_TABLET | Freq: Two times a day (BID) | ORAL | Status: DC
  Administered 2017-05-14 – 2017-05-15 (×3): 5 mg via ORAL
  Filled 2017-05-14 (×3): qty 1

## 2017-05-14 NOTE — Progress Notes (Signed)
ANTICOAGULATION CONSULT NOTE - Initial Consult  Pharmacy Consult for Apixaban Indication: atrial fibrillation  Allergies  Allergen Reactions  . Nifedipine     UNSPECIFIED REACTION   . Rabeprazole Sodium Nausea Only    Patient Measurements: Height: 5' 10.5" (179.1 cm) Weight: 188 lb 14.4 oz (85.7 kg) IBW/kg (Calculated) : 74.15  Vital Signs: Temp: 97.8 F (36.6 C) (08/06 0839) Temp Source: Oral (08/06 0839) BP: 127/110 (08/06 0839)  Labs:  Recent Labs  05/12/17 0252 05/13/17 0312  HGB 11.7* 11.7*  HCT 35.1* 35.6*  PLT 132* 155  CREATININE 1.04 1.09    Estimated Creatinine Clearance: 50.1 mL/min (by C-G formula based on SCr of 1.09 mg/dL).   Medical History: Past Medical History:  Diagnosis Date  . Angina pectoris (HCC)   . Chronic combined systolic and diastolic heart failure, NYHA class 2 (HCC) 2014   EF reportedly anywhere from 25-40%  . Coronary artery disease involving native coronary artery    Reportedly multivessel  . Essential hypertension   . Gout   . History of MI (myocardial infarction)   . Hyperlipemia   . Hyperlipidemia   . Hypothyroidism   . Ischemic dilated cardiomyopathy (HCC)   . Multi-vessel coronary artery stenosis   . Varicose veins of both lower extremities    Assessment: 89yom to begin apixaban for persistent post op afib. Will use 5mg  bid as he only meets one criteria for dose adjustment (age > 94). Wt = 85kg. SCr = 1.09.  Goal of Therapy:  Monitor platelets by anticoagulation protocol: Yes   Plan:  1) Apixaban 5mg  po bid 2) Case management consult for cost 3) Will provide education  Fredrik Rigger 05/14/2017,12:45 PM

## 2017-05-14 NOTE — Progress Notes (Signed)
Physical Therapy Treatment Patient Details Name: Scott [name] MRN: [id number] DOB: 11/17/1926 Today's Date: 05/14/2017    History of Present Illness Pt is an 83 y/o M s/p CABG x3. PMH includes CHF, CAD, angina, MI, HLD, and gout.     PT Comments    Patient required pt required min/mod A for bed mobility and sit to stand transfers and min guard/supervision for ambulation. Pt on RA upon arrival and throughout session. Pt with 1/4 DOE with ambulation. Current plan remains appropriate.    Follow Up Recommendations  Home health PT;Supervision/Assistance - 24 hour     Equipment Recommendations  Rolling walker with 5" wheels    Recommendations for Other Services OT consult     Precautions / Restrictions Precautions Precautions: Sternal;Fall Restrictions Weight Bearing Restrictions: Yes (sternal precautions )    Mobility  Bed Mobility Overal bed mobility: Needs Assistance Bed Mobility: Rolling;Sidelying to Sit Rolling: Min guard Sidelying to sit: Mod assist       General bed mobility comments: cues for sequencing and technique; assist to elevate trunk into sitting  Transfers Overall transfer level: Needs assistance Equipment used: Rolling walker (2 wheeled) Transfers: Sit to/from Stand Sit to Stand: Min assist         General transfer comment: cues for maintaining sternal precautions and for technique; pt used momentum to power up into standing and required min A to get all the up into standing  Ambulation/Gait Ambulation/Gait assistance: Supervision;Min guard Ambulation Distance (Feet): 350 Feet Assistive device: Rolling walker (2 wheeled) Gait Pattern/deviations: Shuffle;Trunk flexed;Decreased stride length;Step-through pattern     General Gait Details: cues for increased bilat step lengths and heel strike as well as posture   Stairs            Wheelchair Mobility    Modified Rankin (Stroke Patients Only)       Balance Overall balance assessment:  Needs assistance Sitting-balance support: No upper extremity supported;Feet supported Sitting balance-Leahy Scale: Good     Standing balance support: Bilateral upper extremity supported;During functional activity Standing balance-Leahy Scale: Poor                              Cognition Arousal/Alertness: Awake/alert Behavior During Therapy: WFL for tasks assessed/performed Overall Cognitive Status: Within Functional Limits for tasks assessed                                        Exercises      General Comments General comments (skin integrity, edema, etc.): wife present throughout session      Pertinent Vitals/Pain Pain Assessment: Faces Faces Pain Scale: Hurts even more Pain Location: incision site; chest with transitional movements Pain Descriptors / Indicators: Aching;Grimacing;Guarding Pain Intervention(s): Monitored during session;Repositioned    Home Living                      Prior Function            PT Goals (current goals can now be found in the care plan section) Acute Rehab PT Goals Patient Stated Goal: go home  Progress towards PT goals: Progressing toward goals    Frequency    Min 3X/week      PT Plan Current plan remains appropriate    Co-evaluation              AM-PAC PT "  6 Clicks" Daily Activity  Outcome Measure  Difficulty turning over in bed (including adjusting bedclothes, sheets and blankets)?: A Little Difficulty moving from lying on back to sitting on the side of the bed? : Total Difficulty sitting down on and standing up from a chair with arms (e.g., wheelchair, bedside commode, etc,.)?: Total Help needed moving to and from a bed to chair (including a wheelchair)?: A Little Help needed walking in hospital room?: A Little Help needed climbing 3-5 steps with a railing? : A Little 6 Click Score: 14    End of Session Equipment Utilized During Treatment: Gait belt Activity Tolerance:  Patient tolerated treatment well Patient left: in chair;with call bell/phone within reach;with family/visitor present Nurse Communication: Mobility status PT Visit Diagnosis: Other abnormalities of gait and mobility (R26.89);Difficulty in walking, not elsewhere classified (R26.2);Muscle weakness (generalized) (M62.81)     Time: 1610-9604 PT Time Calculation (min) (ACUTE ONLY): 22 min  Charges:  $Gait Training: 8-22 mins                    G Codes:       Scott [name], PTA Pager: [phone]     Scott [name] 05/14/2017, 5:04 PM

## 2017-05-14 NOTE — Progress Notes (Signed)
EPW d/c'd per order and per protocol. Pt and wife instructed to remain in bed for 1 h, with q58m vitals. Call bell and phone within reach. Will continue to monitor.

## 2017-05-14 NOTE — Progress Notes (Addendum)
      [address]       [place] [address]             [phone]        5 Days Post-Op Procedure(s) (LRB): CORONARY ARTERY BYPASS GRAFTING (CABG) times 3 using the right radial artery and bilateral internal mammary arteries. (N/A) TRANSESOPHAGEAL ECHOCARDIOGRAM (TEE) (N/A) RADIAL ARTERY HARVEST (Right)  Subjective: Patient with bowel movement this am.  Objective: Vital signs in last 24 hours: Temp:  [97.8 F (36.6 C)-98.3 F (36.8 C)] 97.9 F (36.6 C) (08/06 0500) Pulse Rate:  [89] 89 (08/05 0720) Cardiac Rhythm: Atrial fibrillation (08/05 1956) Resp:  [18-24] 24 (08/06 0529) BP: (102-123)/(64-96) 111/90 (08/06 0529) SpO2:  [95 %-98 %] 98 % (08/06 0500) Weight:  [85.7 kg (188 lb 14.4 oz)] 85.7 kg (188 lb 14.4 oz) (08/06 0500)  Pre op weight 85 kg Current Weight  05/14/17 85.7 kg (188 lb 14.4 oz)      Intake/Output from previous day: 08/05 0701 - 08/06 0700 In: 840 [P.O.:840] Out: 1075 [Urine:1075]   Physical Exam:  Cardiovascular: IRRR IRRR Pulmonary: Clear to auscultation bilaterall Abdomen: Soft, non tender, bowel sounds present. Extremities: Mild bilateral lower extremity edema. Motor/sensory intact right forearm. Wounds: Clean and dry.  No erythema or signs of infection.  Lab Results: CBC: Recent Labs  05/12/17 0252 05/13/17 0312  WBC 13.9* 12.1*  HGB 11.7* 11.7*  HCT 35.1* 35.6*  PLT 132* 155   BMET:  Recent Labs  05/12/17 0252 05/13/17 0312  NA 132* 133*  K 3.8 3.9  CL 98* 99*  CO2 27 29  GLUCOSE 146* 116*  BUN 26* 35*  CREATININE 1.04 1.09  CALCIUM 8.1* 8.2*    PT/INR:  Lab Results  Component Value Date   INR 1.42 05/09/2017   INR 0.87 04/29/2017   ABG:  INR: Will add last result for INR, ABG once components are confirmed Will add last 4 CBG results once components are confirmed  Assessment/Plan:  1. CV - S/p NSTEMI. Remains in a fib with CVR, PVCs. On Amiodarone 400 mg bid, Coreg 6.25 mg bid,  Enalapril 5 mg daily, and Imdur 30 mg daily. As discussed with Dr. Dorris Fetch, will ask cardiology input regarding anticoagulation. 2.  Pulmonary - On room air. Encourage incentive spirometer. 3. Volume Overload - On Lasix 40 mg daily 4.  Acute blood loss anemia - H and H yesterday stable at 11.7 and 35.6 5. Hypothyroidism-continue Levothyroxine 6. Remove EPW 7. Possibly discharge 1-2 days  ZIMMERMAN,DONIELLE MPA-C 05/14/2017,7:09 AM Patient seen and examined, agree with above Appreciate Dr. Elvis Coil input- Eliquis started, continue baby aspirin  Viviann Spare C. Dorris Fetch, MD [facility] [phone]

## 2017-05-14 NOTE — Care Management Important Message (Signed)
Important Message  Patient Details  Name: Dezmen Gest MRN: [id number] Date of Birth: September 15, 1927   Medicare Important Message Given:  Yes    Saadiq Poche Abena 05/14/2017, 10:20 AM

## 2017-05-14 NOTE — Progress Notes (Signed)
Epicardial pacing wires removed. Pt tolerated it well.

## 2017-05-14 NOTE — Progress Notes (Signed)
CARDIAC REHAB PHASE I   PRE:  Rate/Rhythm: 97 afib  BP:  Supine:   Sitting: 113/84  Standing:    SaO2: 97%RA  MODE:  Ambulation: 350 ft   POST:  Rate/Rhythm: 98 afib  BP:  Supine:   Sitting: 108/70  Standing:    SaO2: 99%RA 0929-1003 Pt walked 350 ft on RA with gait belt use, rolling walker and asst x 1. Pt needed reminding to pick feet up. Shuffling gait at times. Wife stated he did this prior to admission. To recliner with encouragement. Pt demonstrated 1250 ml on IS correctly. Call light near pt and wife in room. Pt stopped once to rest.   Luetta Nutting, RN BSN  05/14/2017 9:59 AM

## 2017-05-14 NOTE — Progress Notes (Signed)
Progress Note  Patient Name: Scott [name] Date of Encounter: 05/14/2017  Primary Cardiologist:  Dr. Sol Blazing (In [place])  Subjective   Feeling well. No chest pain, sob or palpitations.   Inpatient Medications    Scheduled Meds: . acetaminophen  1,000 mg Oral Q6H   Or  . acetaminophen (TYLENOL) oral liquid 160 mg/5 mL  1,000 mg Per Tube Q6H  . allopurinol  100 mg Oral Daily  . amiodarone  400 mg Oral BID  . aspirin  81 mg Oral Daily  . atorvastatin  80 mg Oral q1800  . bisacodyl  10 mg Oral Daily   Or  . bisacodyl  10 mg Rectal Daily  . carvedilol  6.25 mg Oral BID WC  . docusate sodium  200 mg Oral Daily  . enalapril  5 mg Oral QHS  . enoxaparin (LOVENOX) injection  40 mg Subcutaneous QHS  . famotidine  20 mg Oral BID  . furosemide  40 mg Oral Daily  . isosorbide mononitrate  30 mg Oral Daily  . levothyroxine  150 mcg Oral QAC breakfast  . moving right along book   Does not apply Once  . sodium chloride flush  3 mL Intravenous Q12H   Continuous Infusions: . sodium chloride     PRN Meds: sodium chloride, alum & mag hydroxide-simeth, magnesium hydroxide, ondansetron (ZOFRAN) IV, oxyCODONE, sodium chloride flush, traMADol   Vital Signs    Vitals:   05/14/17 0500 05/14/17 0529 05/14/17 0839 05/14/17 1100  BP:  111/90 (!) 127/110   Pulse:      Resp: 18 (!) 24 20 (!) 24  Temp: 97.9 F (36.6 C)  97.8 F (36.6 C)   TempSrc: Oral  Oral   SpO2: 98%  97%   Weight: 188 lb 14.4 oz (85.7 kg)     Height:        Intake/Output Summary (Last 24 hours) at 05/14/17 1154 Last data filed at 05/14/17 0800  Gross per 24 hour  Intake              840 ml  Output              800 ml  Net               40 ml   Filed Weights   05/12/17 0653 05/13/17 0447 05/14/17 0500  Weight: 191 lb 6.4 oz (86.8 kg) 190 lb 11.2 oz (86.5 kg) 188 lb 14.4 oz (85.7 kg)    Telemetry    SR - Personally Reviewed  ECG    None today  - Personally Reviewed  Physical Exam   GEN: No acute  distress.   Neck: No JVD Cardiac: IR Ir , no murmurs, rubs, or gallops.  Trace BL LE edema.  Respiratory: Clear to auscultation bilaterally. GI: Soft, nontender, non-distended  MS: No edema; No deformity. Neuro:  Nonfocal  Psych: Normal affect   Labs    Chemistry Recent Labs Lab 05/09/17 0301  05/11/17 0352 05/12/17 0252 05/13/17 0312  NA 135  < > 133* 132* 133*  K 4.0  < > 4.3 3.8 3.9  CL 100*  < > 100* 98* 99*  CO2 28  < > 27 27 29   GLUCOSE 105*  < > 93 146* 116*  BUN 20  < > 17 26* 35*  CREATININE 1.02  < > 1.15 1.04 1.09  CALCIUM 9.0  < > 8.3* 8.1* 8.2*  PROT 6.6  --   --   --   --  ALBUMIN 3.4*  --   --   --   --   AST 24  --   --   --   --   ALT 31  --   --   --   --   ALKPHOS 71  --   --   --   --   BILITOT 0.9  --   --   --   --   GFRNONAA >60  < > 55* >60 59*  GFRAA >60  < > >60 >60 >60  ANIONGAP 7  < > 6 7 5   < > = values in this interval not displayed.   Hematology Recent Labs Lab 05/11/17 0352 05/12/17 0252 05/13/17 0312  WBC 14.0* 13.9* 12.1*  RBC 4.20* 3.94* 4.00*  HGB 12.7* 11.7* 11.7*  HCT 37.7* 35.1* 35.6*  MCV 89.8 89.1 89.0  MCH 30.2 29.7 29.3  MCHC 33.7 33.3 32.9  RDW 13.9 13.7 13.7  PLT 130* 132* 155    Cardiac EnzymesNo results for input(s): TROPONINI in the last 168 hours. No results for input(s): TROPIPOC in the last 168 hours.   BNPNo results for input(s): BNP, PROBNP in the last 168 hours.   DDimer No results for input(s): DDIMER in the last 168 hours.   Radiology    Dg Chest 2 View  Result Date: 05/13/2017 CLINICAL DATA:  Atelectasis.  History of coronary artery disease. EXAM: CHEST  2 VIEW COMPARISON:  05/12/2017 FINDINGS: Slightly increased densities at left lung base suggestive for increased atelectasis and possibly increased pleural fluid. Hazy curvilinear density in the right upper chest may represent an overlying structure. Cardiac silhouette remains enlarged with post CABG changes. No evidence for a large pneumothorax.  Improved aeration in the right lower lung. IMPRESSION: Slightly increased densities at left lung base suggestive for atelectasis and possible pleural fluid. Improved aeration at the right lung base. Densities in the right upper chest probably represent overlying shadows rather than true lung disease. Recommend attention on follow-up. Electronically Signed   By: Richarda Overlie M.D.   On: 05/13/2017 08:09    Cardiac Studies   Cardiac cath 04/30/17  Procedures   Left Heart Cath and Coronary Angiography  Conclusion     Dist RCA lesion, 80 %stenosed.  Ost Cx lesion, 99 %stenosed.  Mid Cx lesion, 99 %stenosed.  Ramus lesion, 70 %stenosed.  Ost LAD to [facility] LAD lesion, 80 %stenosed.    IMPRESSION:Mr Fallas has three-vessel disease with moderate LV dysfunction. His LAD appears subtotally occluded stenosis circumflex at the ostium and midportion. These are calcified vessels. He is a large ramus branch that reaches his apex with moderate proximal disease. He also has 80% distal dominant RCA at the January just prior to the takeoff of the PDA which gives right to left collaterals to the circumflex. I crossed the aortic valve and did not demonstrate a gradient. Intervention of his circumflex would be high risk given the angulation of the takeoff and the fact that it is calcified with a essentially occluded LAD. 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 I'm going to suggest that he have surgical consultation. The sheath was removed and pressure held in the Cath Lab. We will restart heparin without bolus in 4 hours. He left the lab in stable condition.   ECHO 04/28/17: Moderate to severely reduced EF. 30-35%. Akinesis of the mid-apical lateral anteroseptal, d anterior and apical wall - consistent with prior LAD infarct. Mild-moderate aortic stenosis (AVA~0.83 cm). Moderate LA and  RA dilation. Mild RV dilation but normal function.    Patient Profile     83 y.o. male with PMH CAD s/p MI  and stents, obstructive sleep apnea on CPAP, HTN, hypothyroidism who is transferred from [facility] to [facility] for NSTEMI.   Assessment & Plan    1. NSTEMI  - Cath showed multivessel disease. S/p CABG x3. Continue ASA, Imdur and statin.   2. Atrial fibrillation - Patient went into afib PM of 8/4. Rate stable on BB and amiodarone. CHADSVASC score of 5 (age, HTN, CHF, vascular disease). Will start Eliquis 5mg  BId. No prior hx of GI bleed. Walked with cardiac Rehab. Shuffling gait at times. Aim to prevent fall.   3. Chronic systolic CHF - EF of 30-35%. Hopefull will improve with revascularization. Volume status relatively stable.  - Continue BB, ACE and lasix. Volume status stable.   Signed, Manson Passey, PA  05/14/2017, 11:54 AM    Patient seen and examined and history reviewed. Agree with above findings and plan. Patient is progressing well post CABG for severe CAD. Developed post op Afib that is persistent. Rate well controlled on beta blocker and amiodarone. Asymptomatic. [place] Vasc score of 5 would argue for anticoagulation to reduce risk of embolic CVA. Recommend starting Eliquis 5 mg bid as appropriate dose. Will defer to CT surgery continuation of ASA. We will arrange follow up in our office at [place] for follow up until he returns to [place]. Continue diuretic, Coreg, lisinopril.   Peter Sw[place], MDFACC 05/14/2017 12:15 PM

## 2017-05-15 MED ORDER — POTASSIUM CHLORIDE ER 20 MEQ PO TBCR: 20.0000 meq | EXTENDED_RELEASE_TABLET | Freq: Every day | ORAL | 0 refills | Status: DC

## 2017-05-15 MED ORDER — TRAMADOL HCL 50 MG PO TABS: 50.0000 mg | ORAL_TABLET | Freq: Four times a day (QID) | ORAL | 0 refills | Status: AC | PRN

## 2017-05-15 MED ORDER — ACETAMINOPHEN 500 MG PO TABS: 1000.0000 mg | ORAL_TABLET | Freq: Four times a day (QID) | ORAL | 2 refills | Status: AC | PRN

## 2017-05-15 MED ORDER — APIXABAN 5 MG PO TABS: 5.0000 mg | ORAL_TABLET | Freq: Two times a day (BID) | ORAL | 3 refills | Status: DC

## 2017-05-15 MED ORDER — FUROSEMIDE 40 MG PO TABS: 40.0000 mg | ORAL_TABLET | Freq: Every day | ORAL | 0 refills | Status: DC

## 2017-05-15 MED ORDER — AMIODARONE HCL 400 MG PO TABS: 400.0000 mg | ORAL_TABLET | Freq: Two times a day (BID) | ORAL | 2 refills | Status: DC

## 2017-05-15 NOTE — Discharge Instructions (Signed)
Coronary Artery Bypass Grafting, Care After °This sheet gives you information about how to care for yourself after your procedure. Your health care provider may also give you more specific instructions. If you have problems or questions, contact your health care provider. °What can I expect after the procedure? °After the procedure, it is common to have: °· Nausea and a lack of appetite. °· Constipation. °· Weakness and fatigue. °· Depression or irritability. °· Pain or discomfort in your incision areas. ° °Follow these instructions at home: °Medicines °· Take over-the-counter and prescription medicines only as told by your health care provider. Do not stop taking medicines or start any new medicines without approval from your health care provider. °· If you were prescribed an antibiotic medicine, take it as told by your health care provider. Do not stop taking the antibiotic even if you start to feel better. °· Do not drive or use heavy machinery while taking prescription pain medicine. °Incision care °· Follow instructions from your health care provider about how to take care of your incisions. Make sure you: °? Wash your hands with soap and water before you change your bandage (dressing). If soap and water are not available, use hand sanitizer. °? Change your dressing as told by your health care provider. °? Leave stitches (sutures), skin glue, or adhesive strips in place. These skin closures may need to stay in place for 2 weeks or longer. If adhesive strip edges start to loosen and curl up, you may trim the loose edges. Do not remove adhesive strips completely unless your health care provider tells you to do that. °· Keep incision areas clean, dry, and protected. °· Check your incision areas every day for signs of infection. Check for: °? More redness, swelling, or pain. °? More fluid or blood. °? Warmth. °? Pus or a bad smell. °· If incisions were made in your legs: °? Avoid crossing your legs. °? Avoid  sitting for long periods of time. Change positions every 30 minutes. °? Raise (elevate) your legs when you are sitting. °Bathing °· Do not take baths, swim, or use a hot tub until your health care provider approves. °· Only take sponge baths. Pat the incisions dry. Do not rub incisions with a washcloth or towel. °· Ask your health care provider when you can shower. °Eating and drinking °· Eat foods that are high in fiber, such as raw fruits and vegetables, whole grains, beans, and nuts. Meats should be lean cut. Avoid canned, processed, and fried foods. This can help prevent constipation and is a recommended part of a heart-healthy diet. °· Drink enough fluid to keep your urine clear or pale yellow. °· Limit alcohol intake to no more than 1 drink a day for nonpregnant women and 2 drinks a day for men. One drink equals 12 oz of beer, 5 oz of wine, or 1½ oz of hard liquor. °Activity °· Rest and limit your activity as told by your health care provider. You may be instructed to: °? Stop any activity right away if you have chest pain, shortness of breath, irregular heartbeats, or dizziness. Get help right away if you have any of these symptoms. °? Move around frequently for short periods or take short walks as directed by your health care provider. Gradually increase your activities. You may need physical therapy or cardiac rehabilitation to help strengthen your muscles and build your endurance. °? Avoid lifting, pushing, or pulling anything that is heavier than 10 lb (4.5 kg) for at   least 6 weeks or as told by your health care provider.  Do not drive until your health care provider approves.  Ask your health care provider when you may return to work.  Ask your health care provider when you may resume sexual activity. General instructions  Do not use any products that contain nicotine or tobacco, such as cigarettes and e-cigarettes. If you need help quitting, ask your health care provider.  Take 2-3 deep  breaths every few hours during the day, while you recover. This helps expand your lungs and prevent complications like pneumonia after surgery.  If you were given a device called an incentive spirometer, use it several times a day to practice deep breathing. Support your chest with a pillow or your arms when you take deep breaths or cough.  Wear compression stockings as told by your health care provider. These stockings help to prevent blood clots and reduce swelling in your legs.  Weigh yourself every day. This helps identify if your body is holding (retaining) fluid that may make your heart and lungs work harder.  Keep all follow-up visits as told by your health care provider. This is important. Contact a health care provider if:  You have more redness, swelling, or pain around any incision.  You have more fluid or blood coming from any incision.  Any incision feels warm to the touch.  You have pus or a bad smell coming from any incision  You have a fever.  You have swelling in your ankles or legs.  You have pain in your legs.  You gain 2 lb (0.9 kg) or more a day.  You are nauseous or you vomit.  You have diarrhea. Get help right away if:  You have chest pain that spreads to your jaw or arms.  You are short of breath.  You have a fast or irregular heartbeat.  You notice a "clicking" in your breastbone (sternum) when you move.  You have numbness or weakness in your arms or legs.  You feel dizzy or light-headed. Summary  After the procedure, it is common to have pain or discomfort in the incision areas.  Do not take baths, swim, or use a hot tub until your health care provider approves.  Gradually increase your activities. You may need physical therapy or cardiac rehabilitation to help strengthen your muscles and build your endurance.  Weigh yourself every day. This helps identify if your body is holding (retaining) fluid that may make your heart and lungs work  harder. This information is not intended to replace advice given to you by your health care provider. Make sure you discuss any questions you have with your health care provider. Document Released: 04/14/2005 Document Revised: 08/14/2016 Document Reviewed: 08/14/2016 Elsevier Interactive Patient Education  2018 [employer].     Atrial Fibrillation Atrial fibrillation is a type of heartbeat that is irregular or fast (rapid). If you have this condition, your heart keeps quivering in a weird (chaotic) way. This condition can make it so your heart cannot pump blood normally. Having this condition gives a person more risk for stroke, heart failure, and other heart problems. There are different types of atrial fibrillation. Talk with your doctor to learn about the type that you have. Follow these instructions at home:  Take over-the-counter and prescription medicines only as told by your doctor.  If your doctor prescribed a blood-thinning medicine, take it exactly as told. Taking too much of it can cause bleeding. If you do not  take enough of it, you will not have the protection that you need against stroke and other problems.  Do not use any tobacco products. These include cigarettes, chewing tobacco, and e-cigarettes. If you need help quitting, ask your doctor.  If you have apnea (obstructive sleep apnea), manage it as told by your doctor.  Do not drink alcohol.  Do not drink beverages that have caffeine. These include coffee, soda, and tea.  Maintain a healthy weight. Do not use diet pills unless your doctor says they are safe for you. Diet pills may make heart problems worse.  Follow diet instructions as told by your doctor.  Exercise regularly as told by your doctor.  Keep all follow-up visits as told by your doctor. This is important. Contact a doctor if:  You notice a change in the speed, rhythm, or strength of your heartbeat.  You are taking a blood-thinning medicine and you  notice more bruising.  You get tired more easily when you move or exercise. Get help right away if:  You have pain in your chest or your belly (abdomen).  You have sweating or weakness.  You feel sick to your stomach (nauseous).  You notice blood in your throw up (vomit), poop (stool), or pee (urine).  You are short of breath.  You suddenly have swollen feet and ankles.  You feel dizzy.  Your suddenly get weak or numb in your face, arms, or legs, especially if it happens on one side of your body.  You have trouble talking, trouble understanding, or both.  Your face or your eyelid droops on one side. These symptoms may be an emergency. Do not wait to see if the symptoms will go away. Get medical help right away. Call your local emergency services (911 in the [place] Do not drive yourself to the hospital. This information is not intended to replace advice given to you by your health care provider. Make sure you discuss any questions you have with your health care provider. Document Released: 07/04/2008 Document Revised: 03/02/2016 Document Reviewed: 01/20/2015 Elsevier Interactive Patient Education  2018 [employer].  Apixaban oral tablets What is this medicine? APIXABAN (a PIX a ban) is an anticoagulant (blood thinner). It is used to lower the chance of stroke in people with a medical condition called atrial fibrillation. It is also used to treat or prevent blood clots in the lungs or in the veins. This medicine may be used for other purposes; ask your health care provider or pharmacist if you have questions. COMMON BRAND NAME(S): Eliquis What should I tell my health care provider before I take this medicine? They need to know if you have any of these conditions: -bleeding disorders -bleeding in the brain -blood in your stools (black or tarry stools) or if you have blood in your vomit -history of stomach bleeding -kidney disease -liver disease -mechanical heart valve -an  unusual or allergic reaction to apixaban, other medicines, foods, dyes, or preservatives -pregnant or trying to get pregnant -breast-feeding How should I use this medicine? Take this medicine by mouth with a glass of water. Follow the directions on the prescription label. You can take it with or without food. If it upsets your stomach, take it with food. Take your medicine at regular intervals. Do not take it more often than directed. Do not stop taking except on your doctor's advice. Stopping this medicine may increase your risk of a blot clot. Be sure to refill your prescription before you run out of medicine.  Talk to your pediatrician regarding the use of this medicine in children. Special care may be needed. Overdosage: If you think you have taken too much of this medicine contact a poison control center or emergency room at once. NOTE: This medicine is only for you. Do not share this medicine with others. What if I miss a dose? If you miss a dose, take it as soon as you can. If it is almost time for your next dose, take only that dose. Do not take double or extra doses. What may interact with this medicine? This medicine may interact with the following: -aspirin and aspirin-like medicines -certain medicines for fungal infections like ketoconazole and itraconazole -certain medicines for seizures like carbamazepine and phenytoin -certain medicines that treat or prevent blood clots like warfarin, enoxaparin, and dalteparin -clarithromycin -NSAIDs, medicines for pain and inflammation, like ibuprofen or naproxen -rifampin -ritonavir -St. John's wort This list may not describe all possible interactions. Give your health care provider a list of all the medicines, herbs, non-prescription drugs, or dietary supplements you use. Also tell them if you smoke, drink alcohol, or use illegal drugs. Some items may interact with your medicine. What should I watch for while using this medicine? Visit your  doctor or health care professional for regular checks on your progress. Notify your doctor or health care professional and seek emergency treatment if you develop breathing problems; changes in vision; chest pain; severe, sudden headache; pain, swelling, warmth in the leg; trouble speaking; sudden numbness or weakness of the face, arm or leg. These can be signs that your condition has gotten worse. If you are going to have surgery or other procedure, tell your doctor that you are taking this medicine. What side effects may I notice from receiving this medicine? Side effects that you should report to your doctor or health care professional as soon as possible: -allergic reactions like skin rash, itching or hives, swelling of the face, lips, or tongue -signs and symptoms of bleeding such as bloody or black, tarry stools; red or dark-brown urine; spitting up blood or brown material that looks like coffee grounds; red spots on the skin; unusual bruising or bleeding from the eye, gums, or nose This list may not describe all possible side effects. Call your doctor for medical advice about side effects. You may report side effects to FDA at 1-800-FDA-1088. Where should I keep my medicine? Keep out of the reach of children. Store at room temperature between 20 and 25 degrees C (68 and 77 degrees F). Throw away any unused medicine after the expiration date. NOTE: This sheet is a summary. It may not cover all possible information. If you have questions about this medicine, talk to your doctor, pharmacist, or health care provider.  2018 Elsevier/Gold Standard (2016-04-17 11:54:23)  Information on my medicine - ELIQUIS (apixaban)  This medication education was reviewed with me or my healthcare representative as part of my discharge preparation.    Why was Eliquis prescribed for you? Eliquis was prescribed for you to reduce the risk of a blood clot forming that can cause a stroke if you have a medical  condition called atrial fibrillation (a type of irregular heartbeat).  What do You need to know about Eliquis ? Take your Eliquis TWICE DAILY - one tablet in the morning and one tablet in the evening with or without food. If you have difficulty swallowing the tablet whole please discuss with your pharmacist how to take the medication safely.  Take Eliquis exactly  as prescribed by your doctor and DO NOT stop taking Eliquis without talking to the doctor who prescribed the medication.  Stopping may increase your risk of developing a stroke.  Refill your prescription before you run out.  After discharge, you should have regular check-up appointments with your healthcare provider that is prescribing your Eliquis.  In the future your dose may need to be changed if your kidney function or weight changes by a significant amount or as you get older.  What do you do if you miss a dose? If you miss a dose, take it as soon as you remember on the same day and resume taking twice daily.  Do not take more than one dose of ELIQUIS at the same time to make up a missed dose.  Important Safety Information A possible side effect of Eliquis is bleeding. You should call your healthcare provider right away if you experience any of the following: ? Bleeding from an injury or your nose that does not stop. ? Unusual colored urine (red or dark brown) or unusual colored stools (red or black). ? Unusual bruising for unknown reasons. ? A serious fall or if you hit your head (even if there is no bleeding).  Some medicines may interact with Eliquis and might increase your risk of bleeding or clotting while on Eliquis. To help avoid this, consult your healthcare provider or pharmacist prior to using any new prescription or non-prescription medications, including herbals, vitamins, non-steroidal anti-inflammatory drugs (NSAIDs) and supplements.  This website has more information on Eliquis (apixaban):  http://www.eliquis.com/eliquis/home

## 2017-05-15 NOTE — Progress Notes (Signed)
Pt discharged per w/c with all belongings accompanied by Volunteer and wife Pt has his personal belongings and discharge packet with prescriptions

## 2017-05-15 NOTE — Progress Notes (Signed)
Went over all discharge instructions with patient and wife. They have prescriptions inhand -packet for d/c aware of follow up appointments

## 2017-05-15 NOTE — Progress Notes (Signed)
CARDIAC REHAB PHASE I  Cardiac surgery discharge education completed with pt and wife at bedside. Reviewed IS, sternal precautions, activity progression, exercise, heart healthy diet, daily weights and phase 2 cardiac rehab. Pt and wife verbalized understanding. Pt agrees to phase 2 cardiac rehab referral, will send to  per pt request. Pt in bed, call bell within reach.  [id number] Joylene Grapes, RN, BSN 05/15/2017 10:43 AM

## 2017-05-15 NOTE — Care Management Note (Addendum)
Case Management Note Previous CM note initiated by Leone Haven, RN--05/11/2017, 4:56 PM   Patient Details  Name: Scott [name] MRN: [id number] Date of Birth: [birth year]  Subjective/Objective:    From home, Pt  ruled in for NSTEMI.. Cardiac Cath 7/23 - 3 vessel CAD. CVTS consulted. Plan for [facility] and CABG 8/1. PTA Pt was traveling from [place]. POD 1 CABGweaning milrinone, diursis.  8/3 1656 Scott Cape RN, BSN - per wife they will be staying in [place] with his sister at address [address] [place] [address]- home phone [phone], until he gets better , then he will go back home.  [name] cell phone is [phone].  Wife states they would like HHPT with [facility], referral made to [facility] for HHPT and rolling walker.  Patient most likely will dc next week. Will need HH orders for HHPT .                 Action/Plan: NCM will follow for dc needs.   Expected Discharge Date:  05/15/17               Expected Discharge Plan:  Home w Home Health Services  In-House Referral:     Discharge planning Services  CM Consult  Post Acute Care Choice:  Durable Medical Equipment, Home Health Choice offered to:  Spouse  DME Arranged:  Walker rolling-- pt declined DME Agency:  [facility]  HH Arranged:  PT HH Agency:  [facility]  Status of Service:  Completed, signed off  If discussed at Long Length of Stay Meetings, dates discussed:  8/7  Discharge Disposition: home/home health   Additional Comments:  05/15/17- 1000- Scott Loughmiller RN, CM- pt for d/c home today- [facility] services have been set up with [facility]- for HHPT- spoke with pt and wife at bedside regarding RW for home- per wife- they have someone that they plan to borrow a RW from- and do not want a RW for discharge- will not have a RW delivered as per pt/wife- they have declined DME.  Per bedside RN- pt has been started on Eliquis post op- which is new- 30 day free card given to pt to use  on discharge.-  1245- notified by [facility]- that pt is out of network for HHPT- and would have to pay out of pocket- spoke with pt and wife at bedside pt has an out of state plan- discussed HH and cost of out of pocket paying which would be $195 per visit- pt and wife have decided not to have HHPT- and decline any other referral to another agency- pt will continue to increase mobility- and plans to go to Cardiac Rehab when cleared to do so.   Scott Alpers Carver, RN 05/15/2017, 9:59 AM [phone]

## 2017-05-15 NOTE — Progress Notes (Addendum)
      [address]       [place] [address]             [phone]      6 Days Post-Op Procedure(s) (LRB): CORONARY ARTERY BYPASS GRAFTING (CABG) times 3 using the right radial artery and bilateral internal mammary arteries. (N/A) TRANSESOPHAGEAL ECHOCARDIOGRAM (TEE) (N/A) RADIAL ARTERY HARVEST (Right)   Subjective:  No new complaints.  He states he is ready to go home.  + ambulation  + BM  Objective: Vital signs in last 24 hours: Temp:  [97.8 F (36.6 C)-98.9 F (37.2 C)] 98.5 F (36.9 C) (08/07 0400) Cardiac Rhythm: Atrial fibrillation (08/07 0700) Resp:  [17-24] 22 (08/07 0400) BP: (96-127)/(60-110) 96/63 (08/07 0400) SpO2:  [97 %-100 %] 100 % (08/07 0005) Weight:  [188 lb 11.2 oz (85.6 kg)] 188 lb 11.2 oz (85.6 kg) (08/07 0400)  Intake/Output from previous day: 08/06 0701 - 08/07 0700 In: 1050 [P.O.:1050] Out: 1200 [Urine:1200]  General appearance: alert, cooperative and no distress Heart: regular rate and rhythm Lungs: clear to auscultation bilaterally Abdomen: soft, non-tender; bowel sounds normal; no masses,  no organomegaly Extremities: edema trace Wound: clean and dry  Lab Results:  Recent Labs  05/13/17 0312  WBC 12.1*  HGB 11.7*  HCT 35.6*  PLT 155   BMET:  Recent Labs  05/13/17 0312  NA 133*  K 3.9  CL 99*  CO2 29  GLUCOSE 116*  BUN 35*  CREATININE 1.09  CALCIUM 8.2*    PT/INR: No results for input(s): LABPROT, INR in the last 72 hours. ABG    Component Value Date/Time   PHART 7.358 05/09/2017 2058   HCO3 25.0 05/09/2017 2058   TCO2 25 05/10/2017 1630   ACIDBASEDEF 1.0 05/09/2017 2058   O2SAT 94.0 05/09/2017 2058   CBG (last 3)   Recent Labs  05/12/17 0822  GLUCAP 96    Assessment/Plan: S/P Procedure(s) (LRB): CORONARY ARTERY BYPASS GRAFTING (CABG) times 3 using the right radial artery and bilateral internal mammary arteries. (N/A) TRANSESOPHAGEAL ECHOCARDIOGRAM (TEE) (N/A) RADIAL ARTERY HARVEST  (Right)  1. CV- S/P NSTEMI, in rate controlled with PVCs- on Amiodarone 400 mg BID, Coreg at 6.25, Enalapril 5 mg daily, on Imdur 30 mg daily, on Eliquis... Appreciate Cardiology assistance  2. Pulm- no acute issue, good use of IS 3. Reanl- weight is stable, but improved, will taper lasix over next 7 days 4. Hypothyroid-continue Synthroid 5. Dispo- patient stable, Home PT arranged, will plan to d/c home today   LOS: 18 days    BARRETT, ERIN 05/15/2017 Patient seen and examined, agree with above  Viviann Spare C. Dorris Fetch, MD [facility] [phone]

## 2017-05-21 ENCOUNTER — Telehealth: Payer: Self-pay | Admitting: Physician Assistant

## 2017-05-21 NOTE — Telephone Encounter (Signed)
Call pt back.Wife states they have recently moved here from [place] and he got sick with his heart before he could find a PCP.

## 2017-05-21 NOTE — Telephone Encounter (Signed)
Spoke with pt's wife. The pt has not been established here as of yet, but has an appointment with Jacolyn Reedy, PA on 05/30/17. Pt has gained 4 lbs Since Friday. He is not having any visible swelling, no SOB, nor has his diet changed. He is complaining that he is constipated. He has no had a bowel movement in days. Pt's wife is wondering what she could give him to help. Please advise. I will send to DOD.

## 2017-05-21 NOTE — Telephone Encounter (Signed)
I have never seen patient this is Dr Charlott Rakes patient seen in hospital with recent CABG ischemic DCM Probably needs lasix restarted forward to her

## 2017-05-21 NOTE — Telephone Encounter (Signed)
Please call patient regarding weight gain and constipation. Patient has not yet been seen in our office only in hospital. / tg

## 2017-05-21 NOTE — Telephone Encounter (Signed)
Should ask primary

## 2017-05-22 ENCOUNTER — Inpatient Hospital Stay (HOSPITAL_COMMUNITY)
Admission: EM | Admit: 2017-05-22 | Discharge: 2017-05-24 | DRG: 291 | Disposition: A | Discharge status: Home / Self Care | Payer: Medicare (Managed Care) | Attending: Internal Medicine | Admitting: Internal Medicine

## 2017-05-22 ENCOUNTER — Emergency Department (HOSPITAL_COMMUNITY): Payer: Medicare (Managed Care)

## 2017-05-22 ENCOUNTER — Telehealth: Payer: Self-pay

## 2017-05-22 ENCOUNTER — Encounter (HOSPITAL_COMMUNITY): Payer: Self-pay

## 2017-05-22 DIAGNOSIS — Z8249 Family history of ischemic heart disease and other diseases of the circulatory system: Secondary | ICD-10-CM

## 2017-05-22 DIAGNOSIS — I5043 Acute on chronic combined systolic (congestive) and diastolic (congestive) heart failure: Secondary | ICD-10-CM | POA: Diagnosis present

## 2017-05-22 DIAGNOSIS — I4891 Unspecified atrial fibrillation: Secondary | ICD-10-CM | POA: Diagnosis present

## 2017-05-22 DIAGNOSIS — I5023 Acute on chronic systolic (congestive) heart failure: Secondary | ICD-10-CM | POA: Diagnosis not present

## 2017-05-22 DIAGNOSIS — Z951 Presence of aortocoronary bypass graft: Secondary | ICD-10-CM

## 2017-05-22 DIAGNOSIS — I255 Ischemic cardiomyopathy: Secondary | ICD-10-CM | POA: Diagnosis present

## 2017-05-22 DIAGNOSIS — I110 Hypertensive heart disease with heart failure: Principal | ICD-10-CM | POA: Diagnosis present

## 2017-05-22 DIAGNOSIS — K5900 Constipation, unspecified: Secondary | ICD-10-CM | POA: Diagnosis present

## 2017-05-22 DIAGNOSIS — I10 Essential (primary) hypertension: Secondary | ICD-10-CM

## 2017-05-22 DIAGNOSIS — Z8052 Family history of malignant neoplasm of bladder: Secondary | ICD-10-CM | POA: Diagnosis not present

## 2017-05-22 DIAGNOSIS — Z888 Allergy status to other drugs, medicaments and biological substances status: Secondary | ICD-10-CM

## 2017-05-22 DIAGNOSIS — R0602 Shortness of breath: Secondary | ICD-10-CM | POA: Diagnosis present

## 2017-05-22 DIAGNOSIS — I25110 Atherosclerotic heart disease of native coronary artery with unstable angina pectoris: Secondary | ICD-10-CM

## 2017-05-22 DIAGNOSIS — I4892 Unspecified atrial flutter: Secondary | ICD-10-CM | POA: Diagnosis present

## 2017-05-22 DIAGNOSIS — Z9049 Acquired absence of other specified parts of digestive tract: Secondary | ICD-10-CM | POA: Diagnosis not present

## 2017-05-22 DIAGNOSIS — I252 Old myocardial infarction: Secondary | ICD-10-CM | POA: Diagnosis not present

## 2017-05-22 DIAGNOSIS — E785 Hyperlipidemia, unspecified: Secondary | ICD-10-CM | POA: Diagnosis present

## 2017-05-22 DIAGNOSIS — J9601 Acute respiratory failure with hypoxia: Secondary | ICD-10-CM | POA: Diagnosis present

## 2017-05-22 DIAGNOSIS — I8393 Asymptomatic varicose veins of bilateral lower extremities: Secondary | ICD-10-CM | POA: Diagnosis present

## 2017-05-22 DIAGNOSIS — Z955 Presence of coronary angioplasty implant and graft: Secondary | ICD-10-CM

## 2017-05-22 DIAGNOSIS — I420 Dilated cardiomyopathy: Secondary | ICD-10-CM | POA: Diagnosis present

## 2017-05-22 DIAGNOSIS — J810 Acute pulmonary edema: Secondary | ICD-10-CM

## 2017-05-22 DIAGNOSIS — Z7901 Long term (current) use of anticoagulants: Secondary | ICD-10-CM

## 2017-05-22 DIAGNOSIS — I2510 Atherosclerotic heart disease of native coronary artery without angina pectoris: Secondary | ICD-10-CM | POA: Diagnosis present

## 2017-05-22 DIAGNOSIS — I350 Nonrheumatic aortic (valve) stenosis: Secondary | ICD-10-CM | POA: Diagnosis present

## 2017-05-22 DIAGNOSIS — E039 Hypothyroidism, unspecified: Secondary | ICD-10-CM | POA: Diagnosis present

## 2017-05-22 DIAGNOSIS — Z79899 Other long term (current) drug therapy: Secondary | ICD-10-CM

## 2017-05-22 DIAGNOSIS — G4733 Obstructive sleep apnea (adult) (pediatric): Secondary | ICD-10-CM | POA: Diagnosis present

## 2017-05-22 DIAGNOSIS — Z7982 Long term (current) use of aspirin: Secondary | ICD-10-CM | POA: Diagnosis not present

## 2017-05-22 DIAGNOSIS — Z79890 Hormone replacement therapy (postmenopausal): Secondary | ICD-10-CM

## 2017-05-22 DIAGNOSIS — M109 Gout, unspecified: Secondary | ICD-10-CM | POA: Diagnosis present

## 2017-05-22 DIAGNOSIS — Z95810 Presence of automatic (implantable) cardiac defibrillator: Secondary | ICD-10-CM

## 2017-05-22 DIAGNOSIS — R600 Localized edema: Secondary | ICD-10-CM

## 2017-05-22 LAB — I-STAT TROPONIN, ED: Troponin i, poc: 0.05 ng/mL (ref 0.00–0.08)

## 2017-05-22 LAB — BASIC METABOLIC PANEL
Anion gap: 8 (ref 5–15)
BUN: 25 mg/dL — AB (ref 6–20)
CALCIUM: 8.6 mg/dL — AB (ref 8.9–10.3)
CHLORIDE: 95 mmol/L — AB (ref 101–111)
CO2: 29 mmol/L (ref 22–32)
CREATININE: 1.32 mg/dL — AB (ref 0.61–1.24)
GFR calc Af Amer: 54 mL/min — ABNORMAL LOW (ref >60)
GFR calc non Af Amer: 46 mL/min — ABNORMAL LOW (ref >60)
GLUCOSE: 102 mg/dL — AB (ref 65–99)
Potassium: 4.4 mmol/L (ref 3.5–5.1)
Sodium: 132 mmol/L — ABNORMAL LOW (ref 135–145)

## 2017-05-22 LAB — CBC WITH DIFFERENTIAL/PLATELET
Basophils Absolute: 0 10*3/uL (ref 0.0–0.1)
Basophils Relative: 0 %
EOS ABS: 0.4 10*3/uL (ref 0.0–0.7)
EOS PCT: 4 %
HEMATOCRIT: 35.2 % — AB (ref 39.0–52.0)
Hemoglobin: 11.6 g/dL — ABNORMAL LOW (ref 13.0–17.0)
LYMPHS PCT: 7 %
Lymphs Abs: 0.8 10*3/uL (ref 0.7–4.0)
MCH: 30.1 pg (ref 26.0–34.0)
MCHC: 33 g/dL (ref 30.0–36.0)
MCV: 91.2 fL (ref 78.0–100.0)
MONO ABS: 0.9 10*3/uL (ref 0.1–1.0)
MONOS PCT: 8 %
Neutro Abs: 9.3 10*3/uL — ABNORMAL HIGH (ref 1.7–7.7)
Neutrophils Relative %: 81 %
PLATELETS: 329 10*3/uL (ref 150–400)
RBC: 3.86 MIL/uL — AB (ref 4.22–5.81)
RDW: 14.3 % (ref 11.5–15.5)
WBC: 11.5 10*3/uL — ABNORMAL HIGH (ref 4.0–10.5)

## 2017-05-22 LAB — BRAIN NATRIURETIC PEPTIDE: B Natriuretic Peptide: 884 pg/mL — ABNORMAL HIGH (ref 0.0–100.0)

## 2017-05-22 MED ORDER — SODIUM CHLORIDE 0.9% FLUSH
3.0000 mL | Freq: Two times a day (BID) | INTRAVENOUS | Status: DC
Start: 2017-05-22 — End: 2017-05-24
  Administered 2017-05-23 – 2017-05-24 (×4): 3 mL via INTRAVENOUS

## 2017-05-22 MED ORDER — AMIODARONE HCL 200 MG PO TABS: 200.0000 mg | ORAL_TABLET | Freq: Every day | ORAL | Status: DC

## 2017-05-22 MED ORDER — FUROSEMIDE 10 MG/ML IJ SOLN
60.0000 mg | Freq: Once | INTRAMUSCULAR | Status: AC
  Administered 2017-05-22: 60 mg via INTRAVENOUS
  Filled 2017-05-22: qty 6

## 2017-05-22 MED ORDER — POTASSIUM CHLORIDE ER 20 MEQ PO TBCR: 20.0000 meq | EXTENDED_RELEASE_TABLET | Freq: Every day | ORAL | 0 refills | Status: DC

## 2017-05-22 MED ORDER — CARVEDILOL 3.125 MG PO TABS
6.2500 mg | ORAL_TABLET | Freq: Two times a day (BID) | ORAL | Status: DC
  Administered 2017-05-23 – 2017-05-24 (×4): 6.25 mg via ORAL
  Filled 2017-05-22 (×4): qty 2

## 2017-05-22 MED ORDER — POTASSIUM CHLORIDE CRYS ER 20 MEQ PO TBCR
20.0000 meq | EXTENDED_RELEASE_TABLET | Freq: Every day | ORAL | Status: DC
  Administered 2017-05-23 – 2017-05-24 (×2): 20 meq via ORAL
  Filled 2017-05-22 (×2): qty 1

## 2017-05-22 MED ORDER — APIXABAN 5 MG PO TABS
5.0000 mg | ORAL_TABLET | Freq: Two times a day (BID) | ORAL | Status: DC
  Administered 2017-05-23 – 2017-05-24 (×4): 5 mg via ORAL
  Filled 2017-05-22 (×4): qty 1

## 2017-05-22 MED ORDER — SODIUM CHLORIDE 0.9% FLUSH: 3.0000 mL | INTRAVENOUS | Status: DC | PRN

## 2017-05-22 MED ORDER — LEVOTHYROXINE SODIUM 75 MCG PO TABS
150.0000 ug | ORAL_TABLET | Freq: Every day | ORAL | Status: DC
  Administered 2017-05-23 – 2017-05-24 (×2): 150 ug via ORAL
  Filled 2017-05-22 (×2): qty 2

## 2017-05-22 MED ORDER — ISOSORBIDE MONONITRATE ER 60 MG PO TB24
30.0000 mg | ORAL_TABLET | Freq: Every day | ORAL | Status: DC
  Administered 2017-05-23 – 2017-05-24 (×2): 30 mg via ORAL
  Filled 2017-05-22 (×2): qty 1

## 2017-05-22 MED ORDER — FUROSEMIDE 10 MG/ML IJ SOLN
40.0000 mg | Freq: Two times a day (BID) | INTRAMUSCULAR | Status: DC
  Administered 2017-05-23 – 2017-05-24 (×4): 40 mg via INTRAVENOUS
  Filled 2017-05-22 (×4): qty 4

## 2017-05-22 MED ORDER — AMIODARONE HCL 200 MG PO TABS: 400.0000 mg | ORAL_TABLET | Freq: Two times a day (BID) | ORAL | Status: DC

## 2017-05-22 MED ORDER — VANCOMYCIN HCL IN DEXTROSE 1-5 GM/200ML-% IV SOLN
1000.0000 mg | Freq: Once | INTRAVENOUS | Status: DC
  Administered 2017-05-22: 1000 mg via INTRAVENOUS
  Filled 2017-05-22: qty 200

## 2017-05-22 MED ORDER — DEXTROSE 5 % IV SOLN
2.0000 g | Freq: Once | INTRAVENOUS | Status: AC
  Administered 2017-05-22: 2 g via INTRAVENOUS
  Filled 2017-05-22: qty 2

## 2017-05-22 MED ORDER — ASPIRIN 81 MG PO CHEW
81.0000 mg | CHEWABLE_TABLET | Freq: Every day | ORAL | Status: DC
  Administered 2017-05-23 – 2017-05-24 (×2): 81 mg via ORAL
  Filled 2017-05-22 (×2): qty 1

## 2017-05-22 MED ORDER — ONDANSETRON HCL 4 MG/2ML IJ SOLN: 4.0000 mg | Freq: Four times a day (QID) | INTRAMUSCULAR | Status: DC | PRN

## 2017-05-22 MED ORDER — AMIODARONE HCL 200 MG PO TABS
200.0000 mg | ORAL_TABLET | Freq: Two times a day (BID) | ORAL | Status: DC
  Administered 2017-05-23 – 2017-05-24 (×4): 200 mg via ORAL
  Filled 2017-05-22 (×4): qty 1

## 2017-05-22 MED ORDER — ENALAPRIL MALEATE 5 MG PO TABS
5.0000 mg | ORAL_TABLET | Freq: Every day | ORAL | Status: DC
  Administered 2017-05-23 (×2): 5 mg via ORAL
  Filled 2017-05-22 (×2): qty 1

## 2017-05-22 MED ORDER — PRAVASTATIN SODIUM 40 MG PO TABS
40.0000 mg | ORAL_TABLET | Freq: Every day | ORAL | Status: DC
  Administered 2017-05-23 (×2): 40 mg via ORAL
  Filled 2017-05-22 (×2): qty 1

## 2017-05-22 MED ORDER — SODIUM CHLORIDE 0.9 % IV SOLN: 250.0000 mL | INTRAVENOUS | Status: DC | PRN

## 2017-05-22 MED ORDER — FUROSEMIDE 40 MG PO TABS: 40.0000 mg | ORAL_TABLET | Freq: Every day | ORAL | 0 refills | Status: DC

## 2017-05-22 MED ORDER — ACETAMINOPHEN 325 MG PO TABS
650.0000 mg | ORAL_TABLET | ORAL | Status: DC | PRN
Start: 2017-05-22 — End: 2017-05-24

## 2017-05-22 NOTE — H&P (Signed)
History and Physical    Scott [name] ZOX:[id number] DOB: 06-07-1927 DOA: 05/22/2017  PCP: System, Pcp Not In  Patient coming from:  home  Chief Complaint:  Worsening sob and swelling  HPI: Scott [name] is a 83 y.o. male with medical history significant of [facility] at [facility] after presented there with acute hypoxic resp failure and chf, chronic systolic chf, osa , htn is from [place] and was from out of town due to a family reunion.  He was d/c from [facility] last week.  At that time his weight was 190 lbs.  He reports the last week his sob had been getting worse and he has gained 5 lbs.  His legs are more swollen.  He has been doing well postoperatively and was d/c home (to familys house for recovery).  He denies any pnd, orthopnea, chest pain.  He denies any fevers or coughing.  He has been on lasix 40 mg po daily and was on lasix prior to his CABG but does not recall the dose.  Pt found to be hypoxic with some swelling and referred for admission for chf exacerbation.   Review of Systems: As per HPI otherwise 10 point review of systems negative.   Past Medical History:  Diagnosis Date  . Angina pectoris (HCC)   . Chronic combined systolic and diastolic heart failure, NYHA class 2 (HCC) 2014   EF reportedly anywhere from 25-40%  . Coronary artery disease involving native coronary artery    Reportedly multivessel  . Essential hypertension   . Gout   . History of MI (myocardial infarction)   . Hyperlipemia   . Hyperlipidemia   . Hypothyroidism   . Ischemic dilated cardiomyopathy (HCC)   . Multi-vessel coronary artery stenosis   . Varicose veins of both lower extremities     Past Surgical History:  Procedure Laterality Date  . CHOLECYSTECTOMY    . CORONARY ARTERY BYPASS GRAFT N/A 05/09/2017   Procedure: CORONARY ARTERY BYPASS GRAFTING (CABG) times 3 using the right radial artery and bilateral internal mammary arteries.;  Surgeon: Loreli Slot, MD;  Location: [facility] OR;   Service: Open Heart Surgery;  Laterality: N/A;  . LEFT HEART CATH AND CORONARY ANGIOGRAPHY N/A 04/30/2017   Procedure: Left Heart Cath and Coronary Angiography;  Surgeon: Runell Gess, MD;  Location: [facility] INVASIVE CV LAB;  Service: Cardiovascular;  Laterality: N/A;  . RADIAL ARTERY HARVEST Right 05/09/2017   Procedure: RADIAL ARTERY HARVEST;  Surgeon: Loreli Slot, MD;  Location: [facility] OR;  Service: Open Heart Surgery;  Laterality: Right;  . TEE WITHOUT CARDIOVERSION N/A 05/04/2017   Procedure: TRANSESOPHAGEAL ECHOCARDIOGRAM (TEE);  Surgeon: Quintella Reichert, MD;  Location: [facility] ENDOSCOPY;  Service: Cardiovascular;  Laterality: N/A;  . TEE WITHOUT CARDIOVERSION N/A 05/09/2017   Procedure: TRANSESOPHAGEAL ECHOCARDIOGRAM (TEE);  Surgeon: Loreli Slot, MD;  Location: [facility] OR;  Service: Open Heart Surgery;  Laterality: N/A;  . VASCULAR SURGERY     Vein stripping     reports that he has never smoked. He has never used smokeless tobacco. He reports that he drinks alcohol. He reports that he does not use drugs.  Allergies  Allergen Reactions  . Nifedipine     UNSPECIFIED REACTION   . Rabeprazole Sodium Nausea Only    Family History  Problem Relation Age of Onset  . Heart disease Mother        Pacemaker  . Bladder Cancer Father   . Bladder Cancer Brother   .  Hypertension Son     Prior to Admission medications   Medication Sig Start Date End Date Taking? Authorizing Provider  acetaminophen (TYLENOL) 500 MG tablet Take 2 tablets (1,000 mg total) by mouth every 6 (six) hours as needed. 05/15/17 05/15/18 Yes Barrett, Erin R, PA-C  allopurinol (ZYLOPRIM) 100 MG tablet Take 100 mg by mouth daily.   Yes [provider]  amiodarone (PACERONE) 400 MG tablet Take 1 tablet (400 mg total) by mouth 2 (two) times daily. x7 days then 200 mg (1/2 tab) two times a day x7 days, then 200 mg (1/2) daily 05/15/17  Yes Barrett, Erin R, PA-C  apixaban (ELIQUIS) 5 MG TABS tablet Take 1 tablet (5 mg  total) by mouth 2 (two) times daily. 05/15/17  Yes Barrett, Erin R, PA-C  aspirin 81 MG chewable tablet Chew 81 mg by mouth daily.   Yes [provider]  CALCIUM CARBONATE PO Take 600 mg by mouth 2 (two) times daily. 06/18/09  Yes [provider]  carvedilol (COREG) 6.25 MG tablet Take 6.25 mg by mouth 2 (two) times daily with a meal.   Yes [provider]  Cholecalciferol (VITAMIN D-1000 MAX ST) 1000 units tablet Take 2,000 Units by mouth every morning. 06/16/11  Yes [provider]  cyanocobalamin (TH VITAMIN B12) 100 MCG tablet Take 1 tablet by mouth daily. Take 1 tablet by mouth in the evening.   Yes [provider]  enalapril (VASOTEC) 5 MG tablet Take 5 mg by mouth at bedtime.   Yes [provider]  fluticasone (CUTIVATE) 0.05 % cream Apply topically 2 (two) times daily.   Yes [provider]  furosemide (LASIX) 40 MG tablet Take 1 tablet (40 mg total) by mouth daily. For 7 days 05/22/17  Yes Loreli Slot, MD  isosorbide mononitrate (IMDUR) 30 MG 24 hr tablet Take 30 mg by mouth daily.   Yes [provider]  levothyroxine (SYNTHROID, LEVOTHROID) 150 MCG tablet Take 150 mcg by mouth daily before breakfast.   Yes [provider]  Lutein 20 MG CAPS Take 1 capsule by mouth every morning.   Yes [provider]  Multiple Vitamin (MULTIVITAMIN) tablet Take 1 tablet by mouth daily.   Yes [provider]  nitroGLYCERIN (NITROSTAT) 0.4 MG SL tablet Place 0.4 mg under the tongue every 5 (five) minutes as needed for chest pain.   Yes [provider]  Potassium Chloride ER 20 MEQ TBCR Take 20 mEq by mouth daily. 05/22/17  Yes Loreli Slot, MD  pravastatin (PRAVACHOL) 40 MG tablet Take 40 mg by mouth at bedtime.   Yes [provider]  traMADol (ULTRAM) 50 MG tablet Take 1 tablet (50 mg total) by mouth every 6 (six) hours as needed for moderate pain. 05/15/17  Yes Barrett, Erin R, PA-C   Ubiquinol 100 MG CAPS Take 1 capsule by mouth every morning.   Yes [provider]    Physical Exam: Vitals:   05/22/17 2013 05/22/17 2123 05/22/17 2253  BP: 120/88 117/85   Pulse: 81 70 (!) 42  Resp: 18 (!) 22 (!) 21  Temp: 98.4 F (36.9 C)    TempSrc: Oral    SpO2: 95% 92% 92%  Weight: 85.3 kg (188 lb)    Height: 5\' 10"  (1.778 m)        Constitutional: NAD, calm, comfortable Vitals:   05/22/17 2013 05/22/17 2123 05/22/17 2253  BP: 120/88 117/85   Pulse: 81 70 (!) 42  Resp: 18 (!)  22 (!) 21  Temp: 98.4 F (36.9 C)    TempSrc: Oral    SpO2: 95% 92% 92%  Weight: 85.3 kg (188 lb)    Height: 5\' 10"  (1.778 m)     Eyes: PERRL, lids and conjunctivae normal ENMT: Mucous membranes are moist. Posterior pharynx clear of any exudate or lesions.Normal dentition.  Neck: normal, supple, no masses, no thyromegaly Respiratory: clear to auscultation bilaterally, no wheezing, no crackles. Normal respiratory effort. No accessory muscle use.  Cardiovascular: Regular rate and rhythm, no murmurs / rubs / gallops. trace extremity edema. 2+ pedal pulses. No carotid bruits.  Abdomen: no tenderness, no masses palpated. No hepatosplenomegaly. Bowel sounds positive.  Musculoskeletal: no clubbing / cyanosis. No joint deformity upper and lower extremities. Good ROM, no contractures. Normal muscle tone.  Skin: no rashes, lesions, ulcers. No induration Neurologic: CN 2-12 grossly intact. Sensation intact, DTR normal. Strength 5/5 in all 4.  Psychiatric: Normal judgment and insight. Alert and oriented x 3. Normal mood.    Labs on Admission: I have personally reviewed following labs and imaging studies  CBC:  Recent Labs Lab 05/22/17 2032  WBC 11.5*  NEUTROABS 9.3*  HGB 11.6*  HCT 35.2*  MCV 91.2  PLT 329   Basic Metabolic Panel:  Recent Labs Lab 05/22/17 2032  NA 132*  K 4.4  CL 95*  CO2 29  GLUCOSE 102*  BUN 25*  CREATININE 1.32*  CALCIUM 8.6*   GFR: Estimated  Creatinine Clearance: 39.9 mL/min (A) (by C-G formula based on SCr of 1.32 mg/dL (H)).   Recent Results (from the past 240 hour(s))  Blood Culture (routine x 2)     Status: None (Preliminary result)   Collection Time: 05/22/17  9:57 PM  Result Value Ref Range Status   Specimen Description BLOOD LEFT HAND  Final   Special Requests   Final    BOTTLES DRAWN AEROBIC AND ANAEROBIC Blood Culture adequate volume   Culture PENDING  Incomplete   Report Status PENDING  Incomplete     Radiological Exams on Admission: Dg Chest 2 View  Result Date: 05/22/2017 CLINICAL DATA:  Shortness of breath with lower extremity edema EXAM: CHEST  2 VIEW COMPARISON:  May 13, 2017 FINDINGS: There is interstitial pulmonary edema. There is a small right pleural effusion. There is atelectatic change in both mid and lower lung zones. There is mild airspace consolidation in the left base and medial right base regions. There is cardiomegaly with mild pulmonary venous hypertension. There is aortic atherosclerosis. Patient is status post coronary artery bypass grafting. No evident adenopathy. There is degenerative change in the thoracic spine. There is calcification along the anterior left ventricular wall. There is calcification left carotid artery. IMPRESSION: Evidence of a degree of congestive heart failure. Bibasilar atelectasis. Mild consolidation in the lung bases may be due to atelectasis but may also represent patchy bibasilar pneumonia. Stable cardiomegaly. Calcification along the anterior left ventricular wall is consistent with prior myocardial infarct in this area. There is aortic atherosclerosis. There is left carotid artery calcification. Aortic Atherosclerosis (ICD10-I70.0). Electronically Signed   By: Bretta Bang III M.D.   On: 05/22/2017 21:14   Dg Abdomen 1 View  Result Date: 05/22/2017 CLINICAL DATA:  Constipation for 1 week EXAM: ABDOMEN - 1 VIEW COMPARISON:  None. FINDINGS: There is moderate stool in  the colon. There is no appreciable bowel dilatation or air-fluid level to suggest bowel obstruction. No free air. There are surgical clips in the right upper quadrant. IMPRESSION:  Moderate stool in colon.  No bowel obstruction or free air. Electronically Signed   By: Bretta Bang III M.D.   On: 05/22/2017 21:11    EKG: Independently reviewed. afib rbbb no changes from old cxr reviewed shows edema cannot r/o consolidation Old chart reviewed Case discussed with dr long   Assessment/Plan 83 yo male with acute hypoxic respiratory failure due to chf exacerbation  Principal Problem:   Systolic CHF, acute on chronic (HCC)- suspect his symptoms are mostly due to chf exac.  Serial trop.  Increase lasix to 40mg  iv q 12 hours and monitor renal fxn closely.  Daily wts. chf pathway.  Obtain cardiac echo in the am to c/w last.  Active Problems:   Acute respiratory failure with hypoxia (HCC)- likely due to chf but cannot rule out a component of infection.  Covering with abx vanc/cefepime until cx data available.  Wbc at around 12   Essential hypertension- stable, cont home meds   Coronary artery disease- noted   S/P CABG x 3- aug 1.  Treat chf exac.  Consult cardiology in am also for their expertise since pt just had CABG.  No one has mentioned to his yet AICD.     They plan on going back to [facility] as soon as they can travel.   DVT prophylaxis:  On eloquis Code Status:  full Family Communication:  wife Disposition Plan:  Per day team Consults called:  cardiology Admission status:  admission   Ginnette Gates A MD [facility]  If 7PM-7AM, please contact night-coverage [email or web address] Password [facility]  05/22/2017, 11:17 PM

## 2017-05-22 NOTE — Telephone Encounter (Signed)
Will forward to Dr. Ross.  

## 2017-05-22 NOTE — Progress Notes (Signed)
Pharmacy Note:  Initial antibiotics for Vancomycin and Cefepime ordered by EDP for HCAP.  Estimated Creatinine Clearance: 39.9 mL/min (A) (by C-G formula based on SCr of 1.32 mg/dL (H)).   Allergies  Allergen Reactions  . Nifedipine     UNSPECIFIED REACTION   . Rabeprazole Sodium Nausea Only    Vitals:   05/22/17 2013 05/22/17 2123  BP: 120/88 117/85  Pulse: 81 70  Resp: 18 (!) 22  Temp: 98.4 F (36.9 C)   SpO2: 95% 92%    Anti-infectives    Start     Dose/Rate Route Frequency Ordered Stop   05/22/17 2130  ceFEPIme (MAXIPIME) 2 g in dextrose 5 % 50 mL IVPB     2 g 100 mL/hr over 30 Minutes Intravenous  Once 05/22/17 2125     05/22/17 2130  vancomycin (VANCOCIN) IVPB 1000 mg/200 mL premix     1,000 mg 200 mL/hr over 60 Minutes Intravenous  Once 05/22/17 2125        Plan: Initial doses of Vancomycin and Cefepime X 1 ordered. F/U admission orders for further dosing if therapy continued.  Mady Gemma, [facility] 05/22/2017 9:42 PM

## 2017-05-22 NOTE — Telephone Encounter (Signed)
Mr Shellhorn is C/O weight gain, On Saturday his weight was 192.4 and Monday he was 196.8. Today his weight was 195.8. He is also having some swelling in both feet. And constipation. He denies any shortness of breath, abnormal pain, fevers, N/V. He states that his incision sites look good.  He is scheduled to have his sutures removed tomorrow. I recommended Miralax and stool softner daily for constipation. And I will send over a RX refill on the Lasix 40 mg and Potassium for another week. He will also  Keep his legs elevated when he his sitting and continue weight checks daily. I recommended that he also start to monitor his BP's daily.

## 2017-05-22 NOTE — ED Triage Notes (Signed)
Patient had tripple bypass august 1st at [facility]. Reports of shortness of breath, generalized weakness and leg swelling. Denies pain.

## 2017-05-22 NOTE — ED Notes (Signed)
EKG given to Dr. Long 

## 2017-05-22 NOTE — ED Provider Notes (Signed)
Emergency Department Provider Note   I have reviewed the triage vital signs and the nursing notes.   HISTORY  Chief Complaint Shortness of Breath   HPI Scott [name] is a 83 y.o. male with PMH of CHF (EF 20-25% on TEE 05/09/17), ACS s/p 3-vessel CABG on 05/09/17, HLD, HTN, and hypothyroidism presents to the emergency department for evaluation of worsening shortness of breath. Patient had mild dyspnea yesterday but today has been much more short of breath per family. Denies any fevers or shaking chills. No productive cough. Patient denies any associated chest pain. He has been on Lasix for the past week 40 mg daily and has been compliant with this. He has scheduled follow-up tomorrow morning for staple removal in the right forearm and repeat chest x-ray. He has mild dyspnea at rest but becomes very dyspneic on exertion. He has noticed some swelling and bilateral feet. He also notes continued constipation since his recent CABG. His wife gave him an enema today with some relief in symptoms but he continues to feel constipated. No rectal pain.    Past Medical History:  Diagnosis Date  . Angina pectoris (HCC)   . Chronic combined systolic and diastolic heart failure, NYHA class 2 (HCC) 2014   EF reportedly anywhere from 25-40%  . Coronary artery disease involving native coronary artery    Reportedly multivessel  . Essential hypertension   . Gout   . History of MI (myocardial infarction)   . Hyperlipemia   . Hyperlipidemia   . Hypothyroidism   . Ischemic dilated cardiomyopathy (HCC)   . Multi-vessel coronary artery stenosis   . Varicose veins of both lower extremities     Patient Active Problem List   Diagnosis Date Noted  . Systolic CHF, acute on chronic (HCC) 05/22/2017  . 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 Coronary artery disease 05/09/2017  . Coronary artery disease involving native coronary artery of native heart with unstable angina pectoris (HCC)   . Acute on chronic combined  systolic and diastolic CHF (congestive heart failure) (HCC) 04/30/2017  . Aortic stenosis, mild-moderate 04/30/2017  . Acute respiratory failure with hypoxia (HCC) 04/27/2017  . Atrial flutter by electrocardiogram (HCC) 04/27/2017  . Hyperglycemia 04/27/2017  . NSTEMI (non-ST elevated myocardial infarction) (HCC) 04/27/2017  . Multi-vessel coronary artery stenosis   . Ischemic dilated cardiomyopathy (HCC)   . Hypothyroidism   . Hyperlipidemia with target low density lipoprotein (LDL) cholesterol less than 70 mg/dL   . History of MI (myocardial infarction)   . Gout   . Essential hypertension     Past Surgical History:  Procedure Laterality Date  . CHOLECYSTECTOMY    . CORONARY ARTERY BYPASS GRAFT N/A 05/09/2017   Procedure: CORONARY ARTERY BYPASS GRAFTING (CABG) times 3 using the right radial artery and bilateral internal mammary arteries.;  Surgeon: Loreli Slot, MD;  Location: [facility] OR;  Service: Open Heart Surgery;  Laterality: N/A;  . LEFT HEART CATH AND CORONARY ANGIOGRAPHY N/A 04/30/2017   Procedure: Left Heart Cath and Coronary Angiography;  Surgeon: Runell Gess, MD;  Location: [facility] INVASIVE CV LAB;  Service: Cardiovascular;  Laterality: N/A;  . RADIAL ARTERY HARVEST Right 05/09/2017   Procedure: RADIAL ARTERY HARVEST;  Surgeon: Loreli Slot, MD;  Location: [facility] OR;  Service: Open Heart Surgery;  Laterality: Right;  . TEE WITHOUT CARDIOVERSION N/A 05/04/2017   Procedure: TRANSESOPHAGEAL ECHOCARDIOGRAM (TEE);  Surgeon: Quintella Reichert, MD;  Location: [facility] ENDOSCOPY;  Service: Cardiovascular;  Laterality: N/A;  .  TEE WITHOUT CARDIOVERSION N/A 05/09/2017   Procedure: TRANSESOPHAGEAL ECHOCARDIOGRAM (TEE);  Surgeon: Loreli Slot, MD;  Location: [facility] OR;  Service: Open Heart Surgery;  Laterality: N/A;  . VASCULAR SURGERY     Vein stripping      Allergies Nifedipine and Rabeprazole sodium  Family History  Problem Relation Age of Onset  . Heart disease Mother         Pacemaker  . Bladder Cancer Father   . Bladder Cancer Brother   . Hypertension Son     Social History Social History  Substance Use Topics  . Smoking status: Never Smoker  . Smokeless tobacco: Never Used  . Alcohol use Yes     Comment: Social    Review of Systems  Constitutional: No fever/chills Eyes: No visual changes. ENT: No sore throat. Cardiovascular: Denies chest pain. Respiratory: Positive shortness of breath. Gastrointestinal: No abdominal pain. No nausea, no vomiting. No diarrhea. Positive constipation. Genitourinary: Negative for dysuria. Musculoskeletal: Negative for back pain. Positive LE edema.  Skin: Negative for rash. Neurological: Negative for headaches, focal weakness or numbness.  10-point ROS otherwise negative.  ____________________________________________   PHYSICAL EXAM:  VITAL SIGNS: ED Triage Vitals [05/22/17 2013]  Enc Vitals Group     BP 120/88     Pulse Rate 81     Resp 18     Temp 98.4 F (36.9 C)     Temp Source Oral     SpO2 95 %     Weight 188 lb (85.3 kg)     Height 5\' 10"  (1.778 m)     Pain Score 0   Constitutional: Alert and oriented. Well appearing and in no acute distress. Eyes: Conjunctivae are normal.  Head: Atraumatic. Nose: No congestion/rhinnorhea. Mouth/Throat: Mucous membranes are moist.   Neck: No stridor.   Cardiovascular: Normal rate, regular rhythm. Good peripheral circulation. Grossly normal heart sounds.   Respiratory: Increased respiratory effort.  No retractions. Lungs with crackles at the bases.  Gastrointestinal: Soft and nontender. No distention.  Musculoskeletal: No lower extremity tenderness. 1+ pitting edema in the B/L LEs. No gross deformities of extremities. Neurologic:  Normal speech and language. No gross focal neurologic deficits are appreciated.  Skin:  Skin is warm, dry and intact. No rash noted.  ____________________________________________   LABS (all labs ordered are listed, but only  abnormal results are displayed)  Labs Reviewed  BASIC METABOLIC PANEL - Abnormal; Notable for the following:       Result Value   Sodium 132 (*)    Chloride 95 (*)    Glucose, Bld 102 (*)    BUN 25 (*)    Creatinine, Ser 1.32 (*)    Calcium 8.6 (*)    GFR calc non Af Amer 46 (*)    GFR calc Af Amer 54 (*)    All other components within normal limits  BRAIN NATRIURETIC PEPTIDE - Abnormal; Notable for the following:    B Natriuretic Peptide 884.0 (*)    All other components within normal limits  CBC WITH DIFFERENTIAL/PLATELET - Abnormal; Notable for the following:    WBC 11.5 (*)    RBC 3.86 (*)    Hemoglobin 11.6 (*)    HCT 35.2 (*)    Neutro Abs 9.3 (*)    All other components within normal limits  TROPONIN I - Abnormal; Notable for the following:    Troponin I 0.04 (*)    All other components within normal limits  BRAIN NATRIURETIC PEPTIDE - Abnormal;  Notable for the following:    B Natriuretic Peptide 908.0 (*)    All other components within normal limits  CULTURE, BLOOD (ROUTINE X 2)  CULTURE, BLOOD (ROUTINE X 2)  CULTURE, EXPECTORATED SPUTUM-ASSESSMENT  BASIC METABOLIC PANEL  TROPONIN I  TROPONIN I  I-STAT TROPONIN, ED   ____________________________________________  EKG   EKG Interpretation  Date/Time:  Tuesday May 22 2017 20:17:31 EDT Ventricular Rate:  93 PR Interval:    QRS Duration: 152 QT Interval:  436 QTC Calculation: 543 R Axis:   -69 Text Interpretation:  Atrial fibrillation RBBB and LAFB Abnrm T, consider ischemia, anterolateral lds Baseline wander in lead(s) V1 Similar to prior. No STEMI.  Confirmed by Alona Bene [id number] on 05/22/2017 8:34:29 PM       ____________________________________________  RADIOLOGY  Dg Chest 2 View  Result Date: 05/22/2017 CLINICAL DATA:  Shortness of breath with lower extremity edema EXAM: CHEST  2 VIEW COMPARISON:  May 13, 2017 FINDINGS: There is interstitial pulmonary edema. There is a small right pleural  effusion. There is atelectatic change in both mid and lower lung zones. There is mild airspace consolidation in the left base and medial right base regions. There is cardiomegaly with mild pulmonary venous hypertension. There is aortic atherosclerosis. Patient is status post coronary artery bypass grafting. No evident adenopathy. There is degenerative change in the thoracic spine. There is calcification along the anterior left ventricular wall. There is calcification left carotid artery. IMPRESSION: Evidence of a degree of congestive heart failure. Bibasilar atelectasis. Mild consolidation in the lung bases may be due to atelectasis but may also represent patchy bibasilar pneumonia. Stable cardiomegaly. Calcification along the anterior left ventricular wall is consistent with prior myocardial infarct in this area. There is aortic atherosclerosis. There is left carotid artery calcification. Aortic Atherosclerosis (ICD10-I70.0). Electronically Signed   By: Bretta Bang III M.D.   On: 05/22/2017 21:14   Dg Abdomen 1 View  Result Date: 05/22/2017 CLINICAL DATA:  Constipation for 1 week EXAM: ABDOMEN - 1 VIEW COMPARISON:  None. FINDINGS: There is moderate stool in the colon. There is no appreciable bowel dilatation or air-fluid level to suggest bowel obstruction. No free air. There are surgical clips in the right upper quadrant. IMPRESSION: Moderate stool in colon.  No bowel obstruction or free air. Electronically Signed   By: Bretta Bang III M.D.   On: 05/22/2017 21:11    ____________________________________________   PROCEDURES  Procedure(s) performed:   Procedures  None ____________________________________________   INITIAL IMPRESSION / ASSESSMENT AND PLAN / ED COURSE  Pertinent labs & imaging results that were available during my care of the patient were reviewed by me and considered in my medical decision making (see chart for details).  Patient presents to the emergency  department for evaluation of exertional dyspnea as well as some dyspnea at rest worsening over the past 24 hours. He has been compliant with his 40 mg daily Lasix. Patient also complaining of some constipation. He does seem dyspneic even at rest during my exam. Exam concerning for developing volume overload. No hypoxemia when resting but with even minimal exertion he becomes mildly hypoxic.   Patient with evidence of pulmonary edema on CXR. Bibasilar atelectasis vs infiltrate so will start HCAP abx with recent surgery/hospitalization.   Discussed patient's case with Hospitalist, Dr. Onalee Hua. Patient and family (if present) updated with plan. Care transferred to Hospitalist service.  I reviewed all nursing notes, vitals, pertinent old records, EKGs, labs, imaging (as available).   ____________________________________________  FINAL CLINICAL IMPRESSION(S) / ED DIAGNOSES  Final diagnoses:  Shortness of breath  Acute pulmonary edema (HCC)  Constipation, unspecified constipation type     MEDICATIONS GIVEN DURING THIS VISIT:  Medications  potassium chloride SA (K-DUR,KLOR-CON) CR tablet 20 mEq (not administered)  apixaban (ELIQUIS) tablet 5 mg (5 mg Oral Given 05/23/17 0006)  aspirin chewable tablet 81 mg (not administered)  carvedilol (COREG) tablet 6.25 mg (not administered)  enalapril (VASOTEC) tablet 5 mg (5 mg Oral Given 05/23/17 0006)  isosorbide mononitrate (IMDUR) 24 hr tablet 30 mg (not administered)  levothyroxine (SYNTHROID, LEVOTHROID) tablet 150 mcg (not administered)  pravastatin (PRAVACHOL) tablet 40 mg (40 mg Oral Given 05/23/17 0006)  sodium chloride flush (NS) 0.9 % injection 3 mL (3 mLs Intravenous Given 05/23/17 0012)  sodium chloride flush (NS) 0.9 % injection 3 mL (not administered)  0.9 %  sodium chloride infusion (not administered)  acetaminophen (TYLENOL) tablet 650 mg (not administered)  ondansetron (ZOFRAN) injection 4 mg (not administered)  furosemide (LASIX)  injection 40 mg (40 mg Intravenous Given 05/23/17 0006)  amiodarone (PACERONE) tablet 200 mg (200 mg Oral Given 05/23/17 0006)    Followed by  amiodarone (PACERONE) tablet 200 mg (not administered)  vancomycin (VANCOCIN) 1,250 mg in sodium chloride 0.9 % 250 mL IVPB (not administered)  ceFEPIme (MAXIPIME) 1 g in dextrose 5 % 50 mL IVPB (not administered)  ceFEPIme (MAXIPIME) 2 g in dextrose 5 % 50 mL IVPB (0 g Intravenous Stopped 05/22/17 2249)  furosemide (LASIX) injection 60 mg (60 mg Intravenous Given 05/22/17 2153)     NEW OUTPATIENT MEDICATIONS STARTED DURING THIS VISIT:  None   Note:  This document was prepared using Dragon voice recognition software and may include unintentional dictation errors.  Alona Bene, MD Emergency Medicine    Gaje Tennyson, Arlyss Repress, MD 05/23/17 [phone]

## 2017-05-23 ENCOUNTER — Inpatient Hospital Stay (HOSPITAL_COMMUNITY): Payer: Medicare (Managed Care)

## 2017-05-23 DIAGNOSIS — I5023 Acute on chronic systolic (congestive) heart failure: Secondary | ICD-10-CM

## 2017-05-23 DIAGNOSIS — K5900 Constipation, unspecified: Secondary | ICD-10-CM

## 2017-05-23 DIAGNOSIS — I350 Nonrheumatic aortic (valve) stenosis: Secondary | ICD-10-CM

## 2017-05-23 DIAGNOSIS — J9601 Acute respiratory failure with hypoxia: Secondary | ICD-10-CM

## 2017-05-23 DIAGNOSIS — I255 Ischemic cardiomyopathy: Secondary | ICD-10-CM

## 2017-05-23 LAB — ECHOCARDIOGRAM COMPLETE
AOPV: 0.5 m/s
AOVTI: 45.9 cm
AV Area VTI index: 0.47 cm2/m2
AV Area VTI: 1.26 cm2
AV pk vel: 220 cm/s
AV vel: 1
AVAREAMEANV: 1.01 cm2
AVAREAMEANVIN: 0.48 cm2/m2
AVCELMEANRAT: 0.4
AVG: 12 mmHg
AVPG: 19 mmHg
CHL CUP AV PEAK INDEX: 0.6
CHL CUP AV VALUE AREA INDEX: 0.47
CHL CUP DOP CALC LVOT VTI: 18.1 cm
DOP CAL AO MEAN VELOCITY: 158 cm/s
E decel time: 165 msec
Height: 70 in
LAVOL: 86.9 mL
LAVOLA4C: 79.4 mL
LAVOLIN: 41.1 mL/m2
LVOT area: 2.54 cm2
LVOT diameter: 18 mm
LVOT peak grad rest: 5 mmHg
LVOT peak vel: 109 cm/s
LVOTSV: 46 mL
LVOTVTI: 0.39 cm
MV Dec: 165
MV Peak grad: 4 mmHg
MV pk E vel: 99.8 m/s
RV TAPSE: 11.2 mm
RV sys press: 42 mmHg
Reg peak vel: 262 cm/s
TR max vel: 262 cm/s
Valve area: 1 cm2
Weight: 3132.3 oz

## 2017-05-23 LAB — BASIC METABOLIC PANEL
ANION GAP: 8 (ref 5–15)
BUN: 24 mg/dL — ABNORMAL HIGH (ref 6–20)
CALCIUM: 8.3 mg/dL — AB (ref 8.9–10.3)
CO2: 33 mmol/L — ABNORMAL HIGH (ref 22–32)
CREATININE: 1.26 mg/dL — AB (ref 0.61–1.24)
Chloride: 93 mmol/L — ABNORMAL LOW (ref 101–111)
GFR calc non Af Amer: 49 mL/min — ABNORMAL LOW (ref >60)
GFR, EST AFRICAN AMERICAN: 57 mL/min — AB (ref >60)
Glucose, Bld: 98 mg/dL (ref 65–99)
Potassium: 3.7 mmol/L (ref 3.5–5.1)
SODIUM: 134 mmol/L — AB (ref 135–145)

## 2017-05-23 LAB — TROPONIN I
TROPONIN I: 0.04 ng/mL — AB (ref <0.03)
TROPONIN I: 0.05 ng/mL — AB (ref <0.03)

## 2017-05-23 LAB — MAGNESIUM: MAGNESIUM: 2.1 mg/dL (ref 1.7–2.4)

## 2017-05-23 LAB — BRAIN NATRIURETIC PEPTIDE: B NATRIURETIC PEPTIDE 5: 908 pg/mL — AB (ref 0.0–100.0)

## 2017-05-23 MED ORDER — PERFLUTREN LIPID MICROSPHERE
1.0000 mL | INTRAVENOUS | Status: AC | PRN
  Administered 2017-05-23 (×2): 1 mL via INTRAVENOUS
  Administered 2017-05-23: 2 mL via INTRAVENOUS
  Filled 2017-05-23: qty 10

## 2017-05-23 MED ORDER — BISACODYL 10 MG RE SUPP
10.0000 mg | Freq: Once | RECTAL | Status: AC
  Administered 2017-05-24: 10 mg via RECTAL
  Filled 2017-05-23: qty 1

## 2017-05-23 MED ORDER — MAGNESIUM SULFATE 2 GM/50ML IV SOLN
2.0000 g | Freq: Once | INTRAVENOUS | Status: AC
  Administered 2017-05-23: 2 g via INTRAVENOUS
  Filled 2017-05-23: qty 50

## 2017-05-23 MED ORDER — DEXTROSE 5 % IV SOLN
1.0000 g | INTRAVENOUS | Status: DC
  Administered 2017-05-23: 1 g via INTRAVENOUS
  Filled 2017-05-23 (×2): qty 1

## 2017-05-23 MED ORDER — VANCOMYCIN HCL 10 G IV SOLR: 1250.0000 mg | INTRAVENOUS | Status: DC

## 2017-05-23 NOTE — Consult Note (Signed)
Cardiology Consultation:   Patient ID: Scott [name]; [id number]; 1926-11-11   Admit date: 05/22/2017 Date of Consult: 05/23/2017  Primary Care Provider: System, Pcp Not In Primary Cardiologist:Dr. Lau-[place]     Patient Profile:   Scott [name] is a 83 y.o. [name] with a hx of CABG who is being seen today for the evaluation of CHF at the request of  Dr. Susie Cassette  History of Present Illness:   Scott [name] with known CAD. He suffered in MI in 1987 with subsequent PCI with stent placement in 1999 and 2013 or 2014. He also has a history of CHF, OSA, Hypertension, Hypothyroidism. He is here visiting from [place]. 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 He underwent CABG x 3 utilizing LIMA to OM1, Free RIMA to Ramus Intermediate, and Right radial artery to PDA. Postop had bradycardia so Coreg was stopped then developed atrial fibrillation and was placed on amiodarone.   Patient was discharge 05/13/17. He was readmitted last night with acute respiratory failure with hypoxia secondary to CHF and question of pneumonia. Discharge weight 188 pounds today's weight 195 pounds. BNP over 900. May have gotten extra salt in his diet. Noticed fluid build up Monday and lasix was continued. Has diuresed 1422 cc overnight and feeling better. Still not back to baseline. Has appt to see me next Wed here in [place] office.  Past Medical History:  Diagnosis Date  . Angina pectoris (HCC)   . Chronic combined systolic and diastolic heart failure, NYHA class 2 (HCC) 2014   EF reportedly anywhere from 25-40%  . Coronary artery disease involving native coronary artery    Reportedly multivessel  . Essential hypertension   . Gout   . History of MI (myocardial infarction)   . Hyperlipemia   . Hyperlipidemia   . Hypothyroidism   . Ischemic dilated cardiomyopathy (HCC)   . Multi-vessel coronary artery stenosis     . Varicose veins of both lower extremities     Past Surgical History:  Procedure Laterality Date  . CHOLECYSTECTOMY    . CORONARY ARTERY BYPASS GRAFT N/A 05/09/2017   Procedure: CORONARY ARTERY BYPASS GRAFTING (CABG) times 3 using the right radial artery and bilateral internal mammary arteries.;  Surgeon: Loreli Slot, MD;  Location: [facility] OR;  Service: Open Heart Surgery;  Laterality: N/A;  . LEFT HEART CATH AND CORONARY ANGIOGRAPHY N/A 04/30/2017   Procedure: Left Heart Cath and Coronary Angiography;  Surgeon: Runell Gess, MD;  Location: [facility] INVASIVE CV LAB;  Service: Cardiovascular;  Laterality: N/A;  . RADIAL ARTERY HARVEST Right 05/09/2017   Procedure: RADIAL ARTERY HARVEST;  Surgeon: Loreli Slot, MD;  Location: [facility] OR;  Service: Open Heart Surgery;  Laterality: Right;  . TEE WITHOUT CARDIOVERSION N/A 05/04/2017   Procedure: TRANSESOPHAGEAL ECHOCARDIOGRAM (TEE);  Surgeon: Quintella Reichert, MD;  Location: [facility] ENDOSCOPY;  Service: Cardiovascular;  Laterality: N/A;  . TEE WITHOUT CARDIOVERSION N/A 05/09/2017   Procedure: TRANSESOPHAGEAL ECHOCARDIOGRAM (TEE);  Surgeon: Loreli Slot, MD;  Location: [facility] OR;  Service: Open Heart Surgery;  Laterality: N/A;  . VASCULAR SURGERY     Vein stripping     Inpatient Medications: Scheduled Meds: . amiodarone  200 mg Oral BID   Followed by  . [START ON 05/30/2017] amiodarone  200 mg Oral Daily  . apixaban  5 mg Oral BID  . aspirin  81 mg Oral Daily  .  carvedilol  6.25 mg Oral BID WC  . enalapril  5 mg Oral QHS  . furosemide  40 mg Intravenous Q12H  . isosorbide mononitrate  30 mg Oral Daily  . levothyroxine  150 mcg Oral QAC breakfast  . potassium chloride SA  20 mEq Oral Daily  . pravastatin  40 mg Oral QHS  . sodium chloride flush  3 mL Intravenous Q12H   Continuous Infusions: . sodium chloride    . ceFEPime (MAXIPIME) IV    . vancomycin     PRN Meds: sodium chloride, acetaminophen, ondansetron (ZOFRAN) IV, sodium  chloride flush  Allergies:    Allergies  Allergen Reactions  . Nifedipine     UNSPECIFIED REACTION   . Rabeprazole Sodium Nausea Only    Social History:   Social History   Social History  . Marital status: Married    Spouse name: N/A  . Number of children: 3  . Years of education: N/A   Occupational History  . Not on file.   Social History Main Topics  . Smoking status: Never Smoker  . Smokeless tobacco: Never Used  . Alcohol use Yes     Comment: Social  . Drug use: No  . Sexual activity: No   Other Topics Concern  . Not on file   Social History Narrative   Remarried, with current wife 1 year. Has 3 children with his former wife. Visiting from [place].    Family History:     Family History  Problem Relation Age of Onset  . Heart disease Mother        Pacemaker  . Bladder Cancer Father   . Bladder Cancer Brother   . Hypertension Son      ROS:  Please see the history of present illness.  Review of Systems  Constitution: Positive for malaise/fatigue.  HENT: Negative.   Cardiovascular: Positive for dyspnea on exertion, irregular heartbeat and leg swelling.  Respiratory: Positive for shortness of breath.   Endocrine: Negative.   Hematologic/Lymphatic: Negative.   Musculoskeletal: Negative.   Gastrointestinal: Negative.   Genitourinary: Negative.   Neurological: Negative.     All other ROS reviewed and negative.     Physical Exam/Data:   Vitals:   05/22/17 2123 05/22/17 2253 05/22/17 2318 05/23/17 0500  BP: 117/85  113/60 (!) 177/92  Pulse: 70 (!) 42 83 73  Resp: (!) 22 (!) 21 20 20   Temp:   98.1 F (36.7 C) 98 F (36.7 C)  TempSrc:   Oral Oral  SpO2: 92% 92% 95% 96%  Weight:   195 lb 12.3 oz (88.8 kg) 195 lb 12.3 oz (88.8 kg)  Height:   5\' 10"  (1.778 m)     Intake/Output Summary (Last 24 hours) at 05/23/17 0810 Last data filed at 05/23/17 0300  Gross per 24 hour  Intake               53 ml  Output             1475 ml  Net            -1422  ml   Filed Weights   05/22/17 2013 05/22/17 2318 05/23/17 0500  Weight: 188 lb (85.3 kg) 195 lb 12.3 oz (88.8 kg) 195 lb 12.3 oz (88.8 kg)   Body mass index is 28.09 kg/m.  General:  Well nourished, well developed, in no acute distress  HEENT: normal Lymph: no adenopathy Neck: no JVD Endocrine:  No thryomegaly Vascular: No carotid bruits;  FA pulses 2+ bilaterally without bruits  Cardiac:  irreg irreg distant HS, 1-2/6 sys murmur LSB Lungs:  Decreased breath sounds with rales at bases  Abd: soft, nontender, no hepatomegaly  Ext: plus 1  edema Musculoskeletal:  No deformities, BUE and BLE strength normal and equal Skin: warm and dry incisions healing well Neuro:  CNs 2-12 intact, no focal abnormalities noted Psych:  Normal affect   EKG:  The EKG was personally reviewed and demonstrates:  Afib with RBBB Telemetry:  Telemetry was personally reviewed and demonstrates:  Afib rate controlled  Relevant CV Studies: TEE: 05/04/17   Study Conclusions   - Left ventricle: The cavity size was mildly dilated. Systolic   function was moderately to severely reduced. The estimated   ejection fraction was in the range of 30% to 35%. Wall motion was   normal; there were no regional wall motion abnormalities. - Aortic valve: There is severe aoritc annular calcification that   appears somewhat eccentric. The leaflets appear to open well and   at most is mild aortic stenosis. Severely calcified annulus.   Trileaflet; mildly thickened, mildly calcified leaflets. There   was mild regurgitation. - Mitral valve: There was mild to moderate regurgitation directed   centrally. Regurgitant volume (PISA): 20 ml. ERO 0.11cm2. - Left atrium: No evidence of thrombus in the atrial cavity or   appendage. - Right ventricle: The cavity size was mildly dilated. Wall   thickness was normal. - Right atrium: The atrium was moderately dilated. No evidence of   thrombus in the atrial cavity or appendage. -  Tricuspid valve: No evidence of vegetation. - Pulmonic valve: There was trivial regurgitation.   Significant Diagnostic Studies: angiography:     Dist RCA lesion, 80 %stenosed.  Ost Cx lesion, 99 %stenosed.  Mid Cx lesion, 99 %stenosed.  Ramus lesion, 70 %stenosed.  Ost LAD to [facility] LAD lesion, 80 %stenosed   Laboratory Data:  Chemistry Recent Labs Lab 05/22/17 2032  NA 132*  K 4.4  CL 95*  CO2 29  GLUCOSE 102*  BUN 25*  CREATININE 1.32*  CALCIUM 8.6*  GFRNONAA 46*  GFRAA 54*  ANIONGAP 8    No results for input(s): PROT, ALBUMIN, AST, ALT, ALKPHOS, BILITOT in the last 168 hours. Hematology Recent Labs Lab 05/22/17 2032  WBC 11.5*  RBC 3.86*  HGB 11.6*  HCT 35.2*  MCV 91.2  MCH 30.1  MCHC 33.0  RDW 14.3  PLT 329   Cardiac Enzymes Recent Labs Lab 05/22/17 2205  TROPONINI 0.04*    Recent Labs Lab 05/22/17 2104  TROPIPOC 0.05    BNP Recent Labs Lab 05/22/17 2039 05/22/17 2358  BNP 884.0* 908.0*    DDimer No results for input(s): DDIMER in the last 168 hours.  Radiology/Studies:  Dg Chest 2 View  Result Date: 05/22/2017 CLINICAL DATA:  Shortness of breath with lower extremity edema EXAM: CHEST  2 VIEW COMPARISON:  May 13, 2017 FINDINGS: There is interstitial pulmonary edema. There is a small right pleural effusion. There is atelectatic change in both mid and lower lung zones. There is mild airspace consolidation in the left base and medial right base regions. There is cardiomegaly with mild pulmonary venous hypertension. There is aortic atherosclerosis. Patient is status post coronary artery bypass grafting. No evident adenopathy. There is degenerative change in the thoracic spine. There is calcification along the anterior left ventricular wall. There is calcification left carotid artery. IMPRESSION: Evidence of a degree of congestive heart failure. Bibasilar atelectasis. Mild  consolidation in the lung bases may be due to atelectasis but may also  represent patchy bibasilar pneumonia. Stable cardiomegaly. Calcification along the anterior left ventricular wall is consistent with prior myocardial infarct in this area. There is aortic atherosclerosis. There is left carotid artery calcification. Aortic Atherosclerosis (ICD10-I70.0). Electronically Signed   By: Bretta Bang III M.D.   On: 05/22/2017 21:14   Dg Abdomen 1 View  Result Date: 05/22/2017 CLINICAL DATA:  Constipation for 1 week EXAM: ABDOMEN - 1 VIEW COMPARISON:  None. FINDINGS: There is moderate stool in the colon. There is no appreciable bowel dilatation or air-fluid level to suggest bowel obstruction. No free air. There are surgical clips in the right upper quadrant. IMPRESSION: Moderate stool in colon.  No bowel obstruction or free air. Electronically Signed   By: Bretta Bang III M.D.   On: 05/22/2017 21:11    Assessment and Plan:   Acute on chronic systolic CHF diuresed 1400 cc. On Coreg, Vasotec. Lasix 60 mg given yest. Now on 40 mg BID. Was on 40 mg po at home, may need higher dose at D/C. Has appt to see me in office next Wed.  CAD status post recent NSTEMI and CABG x 3 05/09/17  ICM EF 30-35%  Atrial fib post op with CVR on Amio and Eliquis, wonder if this is contributing to CHF  Mild-moderate AS  HTN  HLD  OSA on CPAP  ?Pneumonia on CXR WBC 11.5 and afebrile   Signed, Jacolyn Reedy, PA-C  05/23/2017 8:10 AM   Patient examined chart reviewed Discussed all aspects of care with PA, wife and patient. Volume overload post CABG for ischemic DCM. Improving with iv diuresis. Part of issue is poor veins in legs with previous vein stripping and varicosities. Afib well rate controlled continue amiodarone and consider DCC if CHF persists. Exam with clear lungs LE varicosities and plus one bilateral edema. Has surgical staples in RUE that need to be removed today Would benefit from outpatient cardiac rehab before going back to [facility]

## 2017-05-23 NOTE — Progress Notes (Signed)
CRITICAL VALUE ALERT  Critical Value:  Troponin 0.05  Date & Time Notied:  05/23/2017 at 0820  Provider Notified: Susie Cassette  Orders Received/Actions taken: Monitor

## 2017-05-23 NOTE — Progress Notes (Signed)
Received verbal orders this morning from [facility] physicians to remove patient's staples, as he had appointment scheduled to get them removed today, but being in the hospital he wouldn't make the appointment. The staples came out without difficulty and the patient tolerated the procedure well. No dehiscing noted.

## 2017-05-23 NOTE — Telephone Encounter (Signed)
I am in clinic in [place] on Monday Could add on to see a couple days sooner

## 2017-05-23 NOTE — Progress Notes (Signed)
Pharmacy Antibiotic Note  Scott [name] is a 83 y.o. male admitted on 05/22/2017 with pneumonia.  Pharmacy has been consulted for Vancomycin and Cefepime dosing.  Plan: Vancomycin 1gm IV given in ED, then 1250mg  IV every 24 hours.  Goal trough 15-20 mcg/mL.  Cefepime 2gm IV given in ED, then 1g IV q24h F/U cxs and clinical progress  Monitor V/S, labs, and levels as indicated  Height: 5\' 10"  (177.8 cm) Weight: 195 lb 12.3 oz (88.8 kg) IBW/kg (Calculated) : 73  Temp (24hrs), Avg:98.2 F (36.8 C), Min:98 F (36.7 C), Max:98.4 F (36.9 C)   Recent Labs Lab 05/22/17 2032  WBC 11.5*  CREATININE 1.32*    Normalized CrCl 88mls/min Estimated Creatinine Clearance: 43.4 mL/min (A) (by C-G formula based on SCr of 1.32 mg/dL (H)).    Allergies  Allergen Reactions  . Nifedipine     UNSPECIFIED REACTION   . Rabeprazole Sodium Nausea Only    Antimicrobials this admission: Vancomycin 8/14 >>  Cefepime 8/14 >>   Dose adjustments this admission: N/A  Microbiology results: 8/14 BCx: pending 8/01 MRSA PCR: negative  Thank you for allowing pharmacy to be a part of this patient's care.  Elder Cyphers, BS Pharm D, [place] Clinical Pharmacist Pager [phone] 05/23/2017 7:41 AM

## 2017-05-23 NOTE — Progress Notes (Signed)
*  PRELIMINARY RESULTS* Echocardiogram 2D Echocardiogram has been performed.  Scott [name] 05/23/2017, 11:43 AM

## 2017-05-23 NOTE — Progress Notes (Addendum)
[facility] PROGRESS NOTE  Scott [name] ZOX:[id number] DOB: [birth year] DOA: 05/22/2017   PCP: System, Pcp Not In     Assessment/Plan: Principal Problem:   Systolic CHF, acute on chronic (HCC) Active Problems:   Acute respiratory failure with hypoxia (HCC)   Essential hypertension   Coronary artery disease   S/P CABG x 3   Constipation   83 y.o. male with a hx of MI in 1987 with subsequent PCI with stent placement in 1999 and 2013 or 2014. He also has a history of CHF, OSA, Hypertension, Hypothyroidism. He is here visiting from [place]. 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 He underwent CABG x 3, He was readmitted last night with acute respiratory failure with hypoxia secondary to CHF and question of pneumonia. Discharge weight 188 pounds today's weight 195 pounds. BNP over 900. Cardiology has been consulted  Assessment and plan Systolic CHF, acute on chronic [facility])- suspect his symptoms are mostly due to chf exac.   .   continue diuresis with lasix to 40mg  iv q 12 hours and monitor renal fxn closely.  Daily wts. chf pathway. cardiac echo  Shows EF of   25%.      Atrial fib post op with CVR on Amio and Eliquis, rate controlled    Acute respiratory failure with hypoxia (HCC)- likely due to chf but cannot rule out a component of infection.  Covering with abx vanc/cefepime until cx data available.  Wbc at around 12, will repeat chest x-ray. Discontinue vancomycin as MRSA PCR is negative     Essential hypertension- stable, cont home meds    Coronary artery disease- noted   OSA on CPAP  Mild-moderate AS no gradient, likely not contributing  DVT prophylaxsis ELIQUIS  Code Status:  Full code   Family Communication: Discussed in detail with the patient, all imaging results, lab results explained to the patient   Disposition Plan:  1-2 days      Consultants:  Cardiology  Procedures:  None  Antibiotics: Anti-infectives    Start     Dose/Rate Route Frequency Ordered Stop   05/23/17 2100  ceFEPIme (MAXIPIME) 1 g in dextrose 5 % 50 mL IVPB     1 g 100 mL/hr over 30 Minutes Intravenous Every 24 hours 05/23/17 0749     05/23/17 2000  vancomycin (VANCOCIN) 1,250 mg in sodium chloride 0.9 % 250 mL IVPB     1,250 mg 166.7 mL/hr over 90 Minutes Intravenous Every 24 hours 05/23/17 0749     05/22/17 2130  ceFEPIme (MAXIPIME) 2 g in dextrose 5 % 50 mL IVPB     2 g 100 mL/hr over 30 Minutes Intravenous  Once 05/22/17 2125 05/22/17 2249   05/22/17 2130  vancomycin (VANCOCIN) IVPB 1000 mg/200 mL premix  Status:  Discontinued     1,000 mg 200 mL/hr over 60 Minutes Intravenous  Once 05/22/17 2125 05/22/17 2323         HPI/Subjective: Feels sob has improved, less orthopnea   Objective: Vitals:   05/22/17 2253 05/22/17 2318 05/23/17 0500 05/23/17 0735  BP:  113/60 (!) 177/92   Pulse: (!) 42 83 73 83  Resp: (!) 21 20 20    Temp:  98.1 F (36.7 C) 98 F (36.7 C)   TempSrc:  Oral Oral   SpO2: 92% 95% 96% 92%  Weight:  88.8 kg (195 lb 12.3  oz) 88.8 kg (195 lb 12.3 oz)   Height:  5\' 10"  (1.778 m)      Intake/Output Summary (Last 24 hours) at 05/23/17 0846 Last data filed at 05/23/17 0300  Gross per 24 hour  Intake               53 ml  Output             1475 ml  Net            -1422 ml    Exam:  Examination:  General exam: Appears calm and comfortable  Respiratory system: Clear to auscultation. Respiratory effort normal. Cardiovascular system: S1 & S2 heard, RRR. No JVD, murmurs, rubs, gallops or clicks. No pedal edema. Gastrointestinal system: Abdomen is nondistended, soft and nontender. No organomegaly or masses felt. Normal bowel sounds heard. Central nervous system: Alert and oriented. No focal neurological deficits. Extremities: Symmetric 5 x 5 power. Skin: No rashes, lesions or ulcers Psychiatry: Judgement  and insight appear normal. Mood & affect appropriate.     Data Reviewed: I have personally reviewed following labs and imaging studies  Micro Results Recent Results (from the past 240 hour(s))  Blood Culture (routine x 2)     Status: None (Preliminary result)   Collection Time: 05/22/17  9:57 PM  Result Value Ref Range Status   Specimen Description BLOOD LEFT HAND  Final   Special Requests   Final    BOTTLES DRAWN AEROBIC AND ANAEROBIC Blood Culture adequate volume   Culture NO GROWTH < 12 HOURS  Final   Report Status PENDING  Incomplete  Blood Culture (routine x 2)     Status: None (Preliminary result)   Collection Time: 05/22/17 10:05 PM  Result Value Ref Range Status   Specimen Description BLOOD  Final   Special Requests NONE  Final   Culture NO GROWTH < 12 HOURS  Final   Report Status PENDING  Incomplete    Radiology Reports Dg Chest 2 View  Result Date: 05/22/2017 CLINICAL DATA:  Shortness of breath with lower extremity edema EXAM: CHEST  2 VIEW COMPARISON:  May 13, 2017 FINDINGS: There is interstitial pulmonary edema. There is a small right pleural effusion. There is atelectatic change in both mid and lower lung zones. There is mild airspace consolidation in the left base and medial right base regions. There is cardiomegaly with mild pulmonary venous hypertension. There is aortic atherosclerosis. Patient is status post coronary artery bypass grafting. No evident adenopathy. There is degenerative change in the thoracic spine. There is calcification along the anterior left ventricular wall. There is calcification left carotid artery. IMPRESSION: Evidence of a degree of congestive heart failure. Bibasilar atelectasis. Mild consolidation in the lung bases may be due to atelectasis but may also represent patchy bibasilar pneumonia. Stable cardiomegaly. Calcification along the anterior left ventricular wall is consistent with prior myocardial infarct in this area. There is aortic  atherosclerosis. There is left carotid artery calcification. Aortic Atherosclerosis (ICD10-I70.0). Electronically Signed   By: Bretta Bang III M.D.   On: 05/22/2017 21:14   Dg Chest 2 View  Result Date: 05/13/2017 CLINICAL DATA:  Atelectasis.  History of coronary artery disease. EXAM: CHEST  2 VIEW COMPARISON:  05/12/2017 FINDINGS: Slightly increased densities at left lung base suggestive for increased atelectasis and possibly increased pleural fluid. Hazy curvilinear density in the right upper chest may represent an overlying structure. Cardiac silhouette remains enlarged with post CABG changes. No evidence for a large pneumothorax. Improved aeration  in the right lower lung. IMPRESSION: Slightly increased densities at left lung base suggestive for atelectasis and possible pleural fluid. Improved aeration at the right lung base. Densities in the right upper chest probably represent overlying shadows rather than true lung disease. Recommend attention on follow-up. Electronically Signed   By: Richarda Overlie M.D.   On: 05/13/2017 08:09   Dg Abdomen 1 View  Result Date: 05/22/2017 CLINICAL DATA:  Constipation for 1 week EXAM: ABDOMEN - 1 VIEW COMPARISON:  None. FINDINGS: There is moderate stool in the colon. There is no appreciable bowel dilatation or air-fluid level to suggest bowel obstruction. No free air. There are surgical clips in the right upper quadrant. IMPRESSION: Moderate stool in colon.  No bowel obstruction or free air. Electronically Signed   By: Bretta Bang III M.D.   On: 05/22/2017 21:11   Ct Angio Chest Pe W Or Wo Contrast  Result Date: 04/27/2017 CLINICAL DATA:  83 year old male with 2 hour history of extreme shortness of breath. EXAM: CT ANGIOGRAPHY CHEST WITH CONTRAST TECHNIQUE: Multidetector CT imaging of the chest was performed using the standard protocol during bolus administration of intravenous contrast. Multiplanar CT image reconstructions and MIPs were obtained to evaluate  the vascular anatomy. CONTRAST:  100 mL of Isovue 370. COMPARISON:  No priors. FINDINGS: Cardiovascular: No filling defects in the pulmonary tail tree to suggest underlying pulmonary embolism. Heart size is mildly enlarged. There is no significant pericardial fluid, thickening or pericardial calcification. There is aortic atherosclerosis, as well as atherosclerosis of the great vessels of the mediastinum and the coronary arteries, including calcified atherosclerotic plaque in the left main, left anterior descending, left circumflex and right coronary arteries. Extensive thinning and dystrophic calcifications in the left ventricle involving anterior, anteroseptal and septal wall segments throughout the mid ventricle and apex compatible with extensive remottling and scarring following LAD territory myocardial infarction(s). Calcifications of the aortic valve. Ectasia of ascending thoracic aorta (4.3 cm in diameter). Mediastinum/Nodes: No pathologically enlarged mediastinal or hilar lymph nodes. Esophagus is unremarkable in appearance. No axillary lymphadenopathy. Lungs/Pleura: Small bilateral pleural effusions lying dependently. Widespread ground-glass attenuation and interlobular septal thickening throughout the lungs bilaterally is most compatible with a background of interstitial pulmonary edema. No confluent consolidative airspace disease. No definite suspicious appearing pulmonary nodules or masses are noted. Upper Abdomen: Aortic atherosclerosis. 4.1 cm exophytic simple cyst in the upper pole of the left kidney. Colonic diverticulosis. Status post cholecystectomy. Musculoskeletal: There are no aggressive appearing lytic or blastic lesions noted in the visualized portions of the skeleton. Review of the MIP images confirms the above findings. IMPRESSION: 1. No evidence of pulmonary embolism. 2. There are findings suggestive of congestive heart failure, as discussed above. 3. Aortic atherosclerosis, in addition to  left main and 3 vessel coronary artery disease. In addition, there is evidence of prior distal LAD territory myocardial infarction(s) with extensive scarring in the left ventricle. 4. There are calcifications of the aortic valve. Echocardiographic correlation for evaluation of potential valvular dysfunction may be warranted if clinically indicated. 5. Ectasia of the ascending thoracic aorta (4.3 cm in diameter). Recommend annual imaging followup by CTA or MRA. This recommendation follows 2010 ACCF/AHA/AATS/ACR/ASA/SCA/SCAI/SIR/STS/SVM Guidelines for the Diagnosis and Management of Patients with Thoracic Aortic Disease. Circulation. 2010; 121: W119-J478. 6. Colonic diverticulosis. Aortic Atherosclerosis (ICD10-I70.0). Electronically Signed   By: Trudie Reed M.D.   On: 04/27/2017 09:01   Dg Chest Port 1 View  Result Date: 05/12/2017 CLINICAL DATA:  Status post CABG EXAM: PORTABLE CHEST  1 VIEW COMPARISON:  05/11/2017 FINDINGS: Sternotomy wires overlie normal cardiac silhouette. Interval removal of LEFT and RIGHT chest tubes. No pneumothorax. Improved LEFT basilar atelectasis. IMPRESSION: 1. Removal of chest tubes without pneumothorax. 2. Improvement in LEFT basilar atelectasis. Electronically Signed   By: Genevive Bi M.D.   On: 05/12/2017 07:59   Dg Chest Port 1 View  Result Date: 05/11/2017 CLINICAL DATA:  Chest tube present s/p CABG EXAM: PORTABLE CHEST - 1 VIEW COMPARISON:  the previous day's study FINDINGS: The Swan-Ganz catheter has been retracted, leaving the IJ venous sheath in place. Bilateral chest tubes stable in position, no pneumothorax. Low lung volumes as before with patchy atelectasis or consolidation in the lung bases left greater than right, slightly improved. Heart size upper limits normal for technique. Previous median sternotomy and CABG. Suspect small pleural effusions. IMPRESSION: 1. Bibasilar atelectasis or infiltrate, left greater than right, slightly improved 2. Stable chest  tubes, no pneumothorax. Electronically Signed   By: Corlis Leak M.D.   On: 05/11/2017 07:35   Dg Chest Port 1 View  Result Date: 05/10/2017 CLINICAL DATA:  Status post CABG yesterday EXAM: PORTABLE CHEST 1 VIEW COMPARISON:  Portable chest x-ray of May 09, 2017 FINDINGS: There has been interval extubation of the trachea and esophagus. The lungs are less well inflated today. There is no pneumothorax. A trace of pleural fluid on the left is suspected. The cardiac silhouette is enlarged. The central pulmonary vascularity is mildly prominent but no definite cephalization is observed. The Swan-Ganz catheter tip projects in the right main pulmonary outflow tract. The mediastinal drain in the bilateral chest tubes are in stable position. The sternal wires are intact. There is moderate gaseous distention of the stomach. IMPRESSION: Interval extubation of the trachea with mild hypoinflation. Left basilar atelectasis and small left pleural effusion. No pneumothorax. Cardiomegaly without significant pulmonary vascular congestion. Electronically Signed   By: David  [place] M.D.   On: 05/10/2017 07:27   Dg Chest Port 1 View  Result Date: 05/09/2017 CLINICAL DATA:  Status post CABG. EXAM: PORTABLE CHEST 1 VIEW COMPARISON:  05/08/2017. FINDINGS: Endotracheal tube tip noted 6.1 cm above the carina. NG tube tip noted below the left hemidiaphragm. Swan-Ganz catheter noted with tip in the pulmonary outflow tract . Mediastinum drainage catheter in good anatomic position. Bilateral chest tubes are noted. Miniscule left apical pneumothorax cannot be excluded. Prior CABG. Stable cardiomegaly. Mild bibasilar subsegmental atelectasis. No pleural effusion. IMPRESSION: 1. Lines and tubes bilateral chest tubes in good anatomic position as above. Tiny left apical pneumothorax cannot be excluded. 2. Prior CABG.  Stable cardiomegaly. 3. Mild bibasilar atelectasis . Critical Value/emergent results were called by telephone at the time of  interpretation on 05/09/2017 at 4:24 pm to nurse Deanna Artis, who verbally acknowledged these results. Electronically Signed   By: Maisie Fus  Register   On: 05/09/2017 16:25   Dg Chest Port 1 View  Result Date: 05/08/2017 CLINICAL DATA:  83 year old male preoperative study for CABG. EXAM: PORTABLE CHEST 1 VIEW COMPARISON:  CTA chest and portable chest radiograph 04/27/2017. FINDINGS: Portable AP semi upright view at 2143 hours. Pulmonary interstitial edema has resolved. Stable cardiomegaly and mediastinal contours. Calcified aortic atherosclerosis. Visualized tracheal air column is within normal limits. No pneumothorax or confluent pulmonary opacity. No residual pleural effusion is evident. Negative visible bowel gas pattern. IMPRESSION: Interval resolved pulmonary edema. Stable cardiomegaly. No new cardiopulmonary abnormality. Electronically Signed   By: Odessa Fleming M.D.   On: 05/08/2017 22:01   Dg Chest [facility]  1 View  Result Date: 04/27/2017 CLINICAL DATA:  Shortness of breath and chest pain EXAM: PORTABLE CHEST 1 VIEW COMPARISON:  None. FINDINGS: There are extensive bilateral interstitial opacities. Cardiomediastinal contours are normal. No pneumothorax or sizable pleural effusion. IMPRESSION: Extensive bilateral interstitial opacities which may indicate moderate interstitial pulmonary edema. In the absence of prior studies for comparison, interstitial lung disease would be difficult to exclude. Electronically Signed   By: Deatra Robinson M.D.   On: 04/27/2017 05:45     CBC  Recent Labs Lab 05/22/17 2032  WBC 11.5*  HGB 11.6*  HCT 35.2*  PLT 329  MCV 91.2  MCH 30.1  MCHC 33.0  RDW 14.3  LYMPHSABS 0.8  MONOABS 0.9  EOSABS 0.4  BASOSABS 0.0    Chemistries   Recent Labs Lab 05/22/17 2032 05/23/17 0536  NA 132* 134*  K 4.4 3.7  CL 95* 93*  CO2 29 33*  GLUCOSE 102* 98  BUN 25* 24*  CREATININE 1.32* 1.26*  CALCIUM 8.6* 8.3*    ------------------------------------------------------------------------------------------------------------------ estimated creatinine clearance is 45.5 mL/min (A) (by C-G formula based on SCr of 1.26 mg/dL (H)). ------------------------------------------------------------------------------------------------------------------ No results for input(s): HGBA1C in the last 72 hours. ------------------------------------------------------------------------------------------------------------------ No results for input(s): CHOL, HDL, LDLCALC, TRIG, CHOLHDL, LDLDIRECT in the last 72 hours. ------------------------------------------------------------------------------------------------------------------ No results for input(s): TSH, T4TOTAL, T3FREE, THYROIDAB in the last 72 hours.  Invalid input(s): FREET3 ------------------------------------------------------------------------------------------------------------------ No results for input(s): VITAMINB12, FOLATE, FERRITIN, TIBC, IRON, RETICCTPCT in the last 72 hours.  Coagulation profile No results for input(s): INR, PROTIME in the last 168 hours.  No results for input(s): DDIMER in the last 72 hours.  Cardiac Enzymes  Recent Labs Lab 05/22/17 2205 05/23/17 0536  TROPONINI 0.04* 0.05*   ------------------------------------------------------------------------------------------------------------------ Invalid input(s): POCBNP   CBG: No results for input(s): GLUCAP in the last 168 hours.     Studies: Dg Chest 2 View  Result Date: 05/22/2017 CLINICAL DATA:  Shortness of breath with lower extremity edema EXAM: CHEST  2 VIEW COMPARISON:  May 13, 2017 FINDINGS: There is interstitial pulmonary edema. There is a small right pleural effusion. There is atelectatic change in both mid and lower lung zones. There is mild airspace consolidation in the left base and medial right base regions. There is cardiomegaly with mild pulmonary venous  hypertension. There is aortic atherosclerosis. Patient is status post coronary artery bypass grafting. No evident adenopathy. There is degenerative change in the thoracic spine. There is calcification along the anterior left ventricular wall. There is calcification left carotid artery. IMPRESSION: Evidence of a degree of congestive heart failure. Bibasilar atelectasis. Mild consolidation in the lung bases may be due to atelectasis but may also represent patchy bibasilar pneumonia. Stable cardiomegaly. Calcification along the anterior left ventricular wall is consistent with prior myocardial infarct in this area. There is aortic atherosclerosis. There is left carotid artery calcification. Aortic Atherosclerosis (ICD10-I70.0). Electronically Signed   By: Bretta Bang III M.D.   On: 05/22/2017 21:14   Dg Abdomen 1 View  Result Date: 05/22/2017 CLINICAL DATA:  Constipation for 1 week EXAM: ABDOMEN - 1 VIEW COMPARISON:  None. FINDINGS: There is moderate stool in the colon. There is no appreciable bowel dilatation or air-fluid level to suggest bowel obstruction. No free air. There are surgical clips in the right upper quadrant. IMPRESSION: Moderate stool in colon.  No bowel obstruction or free air. Electronically Signed   By: Bretta Bang III M.D.   On: 05/22/2017 21:11      Lab Results  Component Value Date   HGBA1C 5.7 (H) 05/09/2017   HGBA1C 5.5 04/27/2017   Lab Results  Component Value Date   CREATININE 1.26 (H) 05/23/2017       Scheduled Meds: . amiodarone  200 mg Oral BID   Followed by  . [START ON 05/30/2017] amiodarone  200 mg Oral Daily  . apixaban  5 mg Oral BID  . aspirin  81 mg Oral Daily  . carvedilol  6.25 mg Oral BID WC  . enalapril  5 mg Oral QHS  . furosemide  40 mg Intravenous Q12H  . isosorbide mononitrate  30 mg Oral Daily  . levothyroxine  150 mcg Oral QAC breakfast  . potassium chloride SA  20 mEq Oral Daily  . pravastatin  40 mg Oral QHS  . sodium chloride  flush  3 mL Intravenous Q12H   Continuous Infusions: . sodium chloride    . ceFEPime (MAXIPIME) IV    . vancomycin       LOS: 1 day    Time spent: >30 MINS    Richarda Overlie  [facility] Pager [phone]. If 7PM-7AM, please contact night-coverage at [email or web address], password [email or web address] 05/23/2017, 8:46 AM  LOS: 1 day

## 2017-05-23 NOTE — Evaluation (Signed)
Physical Therapy Evaluation Patient Details Name: Scott [name] MRN: [id number] DOB: January 28, 1927 Today's Date: 05/23/2017   History of Present Illness  Nasri Schlechter is an 83yo white male who comes to [facility] on 8/14 p progressive swelling in lower limbs and increaed SOB. Pt is 14d s/p CABG at [facility] after sustained MI while visiting [place] from [place]. PMH: CHF, ARF, OSA, HTN. Pt has been using a RW for mobiility since CABG, and is still on sternal precautions.   Clinical Impression  Pt admitted with above diagnosis. Pt currently with functional limitations due to the deficits listed below (see "PT Problem List"). Pt received on room air with SpO2 >95%, SpO2: 92% after AMB, increased DOE noted. Functional mobility assessment demonstrates mild-moderate strength impairment in trunk and BLE, the pt now requiring min-assist physical assistance for transfers, whereas the patient performed these at a higher level of independence PTA. Good observance of sternal precautions. Empirically, the patient demonstrates increased risk of recurrent falls AEB gait speed <0.58m/s, forward reach <5", and multiple LOB demonstrated throughout session. Pt will benefit from skilled PT intervention to increase independence and safety with basic mobility in preparation for discharge to the venue listed below.       Follow Up Recommendations Home health PT (Pt reports insurance will not cover HHPT due to being out of state)    Equipment Recommendations  None recommended by PT    Recommendations for Other Services       Precautions / Restrictions Precautions Precautions: Sternal;Fall Restrictions Weight Bearing Restrictions:  (RUE limited s/p vascualr harvest )      Mobility  Bed Mobility Overal bed mobility: Needs Assistance Bed Mobility: Supine to Sit     Supine to sit: Supervision     General bed mobility comments: reviewed sternal precautions, observed as described.   Transfers Overall transfer level: Needs  assistance Equipment used: None;1 person hand held assist Transfers: Sit to/from Stand Sit to Stand: Min assist            Ambulation/Gait Ambulation/Gait assistance: Min guard Ambulation Distance (Feet): 300 Feet Assistive device: None;1 person hand held assist       General Gait Details: stopped due to SOB at 178ft, but thereafter with improved tolerance; unable to take immediate post AMB O2, but at 60sec recovery at 92% SpO2   Stairs            Wheelchair Mobility    Modified Rankin (Stroke Patients Only)       Balance Overall balance assessment: Needs assistance Sitting-balance support: No upper extremity supported;Feet supported Sitting balance-Leahy Scale: Good     Standing balance support: During functional activity;Single extremity supported Standing balance-Leahy Scale: Fair                               Pertinent Vitals/Pain Pain Assessment: No/denies pain    Home Living Family/patient expects to be discharged to:: Private residence Living Arrangements: Spouse/significant other Available Help at Discharge: Family Type of Home: House Home Access: Stairs to enter Entrance Stairs-Rails: None Entrance Stairs-Number of Steps: 4 Home Layout: One level Home Equipment: Bedside commode Additional Comments: Wife states they will be staying in [place] at her sister's home until September.      Prior Function Level of Independence: Independent         Comments: has been using RW and minimal assist for ADL since CABG on 05/09/17     Hand Dominance  Extremity/Trunk Assessment        Lower Extremity Assessment Lower Extremity Assessment: Generalized weakness       Communication   Communication: No difficulties  Cognition Arousal/Alertness: Awake/alert Behavior During Therapy: WFL for tasks assessed/performed Overall Cognitive Status: Within Functional Limits for tasks assessed                                         General Comments      Exercises     Assessment/Plan    PT Assessment Patient needs continued PT services  PT Problem List Decreased strength;Decreased activity tolerance;Decreased mobility;Cardiopulmonary status limiting activity;Decreased balance       PT Treatment Interventions Gait training;Stair training;Functional mobility training;Therapeutic activities;Therapeutic exercise;Balance training;Patient/family education    PT Goals (Current goals can be found in the Care Plan section)  Acute Rehab PT Goals Patient Stated Goal: regain strength/ independennce  PT Goal Formulation: With patient Time For Goal Achievement: 06/06/17 Potential to Achieve Goals: Good    Frequency Min 2X/week   Barriers to discharge        Co-evaluation               AM-PAC PT "6 Clicks" Daily Activity  Outcome Measure Difficulty turning over in bed (including adjusting bedclothes, sheets and blankets)?: A Little Difficulty moving from lying on back to sitting on the side of the bed? : A Little Difficulty sitting down on and standing up from a chair with arms (e.g., wheelchair, bedside commode, etc,.)?: A Lot Help needed moving to and from a bed to chair (including a wheelchair)?: A Little Help needed walking in hospital room?: A Little Help needed climbing 3-5 steps with a railing? : A Lot 6 Click Score: 16    End of Session Equipment Utilized During Treatment: Gait belt Activity Tolerance: Patient tolerated treatment well;Patient limited by fatigue Patient left: with call bell/phone within reach;with family/visitor present;in bed Nurse Communication: Mobility status PT Visit Diagnosis: Other abnormalities of gait and mobility (R26.89);Difficulty in walking, not elsewhere classified (R26.2);Muscle weakness (generalized) (M62.81)    Time: 2130-8657 PT Time Calculation (min) (ACUTE ONLY): 17 min   Charges:   PT Evaluation $PT Eval Moderate Complexity: 1 Mod PT  Treatments $Therapeutic Activity: 8-22 mins   PT G Codes:        8:48 AM, Jun 03, 2017 Rosamaria Lints, PT, DPT Physical Therapist -  [phone] (Office)   Navarre Diana C 06-03-2017, 8:46 AM

## 2017-05-23 NOTE — Care Management Note (Signed)
Case Management Note  Patient Details  Name: Magic Kuhar MRN: [id number] Date of Birth: Jun 06, 1927  Subjective/Objective:                  Pt admitted with CHF. Pt with recent admission. From [place], live with wife and is very active at baseline. Pt uses family members RW, staying with wife's sister. They plan to stay in town until f/u appointment sept 4th and will go back home after that appointment if he is cleared for travel. Pt's wife reports pt walking the length of the home ind pta. Pt not interested in [facility] services. No needs communicated.   Action/Plan: PT eval pending. CM will cont to follow.   Expected Discharge Date:      05/28/2017            Expected Discharge Plan:  Home/Self Care  In-House Referral:  NA  Discharge planning Services  CM Consult  Post Acute Care Choice:  NA Choice offered to:  NA  Status of Service:  In process, will continue to follow  Malcolm Metro, RN 05/23/2017, 3:32 PM

## 2017-05-23 NOTE — Progress Notes (Signed)
CRITICAL VALUE ALERT  Critical Value:  Troponin 0.04  Date & Time Notied:  05/23/17, 0100  Provider Notified: Onalee Hua  Orders Received/Actions taken: monitor

## 2017-05-24 ENCOUNTER — Inpatient Hospital Stay (HOSPITAL_COMMUNITY): Payer: Medicare (Managed Care)

## 2017-05-24 DIAGNOSIS — I350 Nonrheumatic aortic (valve) stenosis: Secondary | ICD-10-CM

## 2017-05-24 DIAGNOSIS — I4891 Unspecified atrial fibrillation: Secondary | ICD-10-CM

## 2017-05-24 DIAGNOSIS — J810 Acute pulmonary edema: Secondary | ICD-10-CM

## 2017-05-24 LAB — COMPREHENSIVE METABOLIC PANEL
ALBUMIN: 2.8 g/dL — AB (ref 3.5–5.0)
ALT: 21 U/L (ref 17–63)
ANION GAP: 8 (ref 5–15)
AST: 19 U/L (ref 15–41)
Alkaline Phosphatase: 93 U/L (ref 38–126)
BUN: 30 mg/dL — ABNORMAL HIGH (ref 6–20)
CHLORIDE: 92 mmol/L — AB (ref 101–111)
CO2: 32 mmol/L (ref 22–32)
Calcium: 7.9 mg/dL — ABNORMAL LOW (ref 8.9–10.3)
Creatinine, Ser: 1.47 mg/dL — ABNORMAL HIGH (ref 0.61–1.24)
GFR calc non Af Amer: 41 mL/min — ABNORMAL LOW (ref >60)
GFR, EST AFRICAN AMERICAN: 47 mL/min — AB (ref >60)
GLUCOSE: 103 mg/dL — AB (ref 65–99)
Potassium: 3.7 mmol/L (ref 3.5–5.1)
SODIUM: 132 mmol/L — AB (ref 135–145)
Total Bilirubin: 0.7 mg/dL (ref 0.3–1.2)
Total Protein: 5.7 g/dL — ABNORMAL LOW (ref 6.5–8.1)

## 2017-05-24 MED ORDER — BISACODYL 10 MG RE SUPP
10.0000 mg | Freq: Once | RECTAL | Status: AC
  Administered 2017-05-24: 10 mg via RECTAL
  Filled 2017-05-24: qty 1

## 2017-05-24 MED ORDER — AMIODARONE HCL 200 MG PO TABS: 200.0000 mg | ORAL_TABLET | Freq: Two times a day (BID) | ORAL | 0 refills | Status: DC

## 2017-05-24 MED ORDER — SORBITOL 70 % SOLN
30.0000 mL | Freq: Once | Status: AC
  Administered 2017-05-24: 30 mL via ORAL
  Filled 2017-05-24: qty 30

## 2017-05-24 MED ORDER — ENALAPRIL MALEATE 5 MG PO TABS: 5.0000 mg | ORAL_TABLET | Freq: Every day | ORAL | 1 refills | Status: DC

## 2017-05-24 MED ORDER — AMIODARONE HCL 200 MG PO TABS: 200.0000 mg | ORAL_TABLET | Freq: Every day | ORAL | 11 refills | Status: AC

## 2017-05-24 MED ORDER — FUROSEMIDE 20 MG PO TABS: 60.0000 mg | ORAL_TABLET | Freq: Every day | ORAL | 1 refills | Status: DC

## 2017-05-24 MED ORDER — ALLOPURINOL 100 MG PO TABS: 100.0000 mg | ORAL_TABLET | Freq: Every day | ORAL | 1 refills | Status: AC

## 2017-05-24 MED ORDER — FUROSEMIDE 40 MG PO TABS: 60.0000 mg | ORAL_TABLET | Freq: Every day | ORAL | Status: DC

## 2017-05-24 NOTE — Telephone Encounter (Signed)
Pt is currently admitted. They will have a follow up after being released.

## 2017-05-24 NOTE — Progress Notes (Signed)
Progress Note  Patient Name: Scott [name] Date of Encounter: 05/24/2017  Primary Cardiologist: Dr Sol Blazing [facility])  Subjective   Up in chair, main complaint is constipation  Inpatient Medications    Scheduled Meds: . amiodarone  200 mg Oral BID   Followed by  . [START ON 05/30/2017] amiodarone  200 mg Oral Daily  . apixaban  5 mg Oral BID  . aspirin  81 mg Oral Daily  . carvedilol  6.25 mg Oral BID WC  . enalapril  5 mg Oral QHS  . furosemide  60 mg Oral Daily  . isosorbide mononitrate  30 mg Oral Daily  . levothyroxine  150 mcg Oral QAC breakfast  . potassium chloride SA  20 mEq Oral Daily  . pravastatin  40 mg Oral QHS  . sodium chloride flush  3 mL Intravenous Q12H   Continuous Infusions: . sodium chloride     PRN Meds: sodium chloride, acetaminophen, ondansetron (ZOFRAN) IV, sodium chloride flush   Vital Signs    Vitals:   05/23/17 0735 05/23/17 2103 05/23/17 2207 05/24/17 0621  BP:   (!) 102/56 (!) 97/51  Pulse: 83     Resp:    20  Temp:   98.6 F (37 C) 97.7 F (36.5 C)  TempSrc:   Oral Oral  SpO2: 92% 92% 96% 94%  Weight:      Height:        Intake/Output Summary (Last 24 hours) at 05/24/17 0955 Last data filed at 05/24/17 0900  Gross per 24 hour  Intake              530 ml  Output             1350 ml  Net             -820 ml   Filed Weights   05/22/17 2013 05/22/17 2318 05/23/17 0500  Weight: 188 lb (85.3 kg) 195 lb 12.3 oz (88.8 kg) 195 lb 12.3 oz (88.8 kg)    Telemetry    AF with VR 80-90 with occasional PVC - Personally Reviewed  ECG    No new - Personally Reviewed  Physical Exam   GEN: No acute distress.   Neck: No JVD Cardiac: irregularly irregular, no murmurs, rubs, or gallops.  Respiratory: decreased at bases, no rales GI: Soft, nontender, non-distended  MS: No edema; No deformity. Neuro:  Nonfocal  Psych: Normal affect   Labs    Chemistry Recent Labs Lab 05/22/17 2032 05/23/17 0536 05/24/17 0800  NA 132* 134*  132*  K 4.4 3.7 3.7  CL 95* 93* 92*  CO2 29 33* 32  GLUCOSE 102* 98 103*  BUN 25* 24* 30*  CREATININE 1.32* 1.26* 1.47*  CALCIUM 8.6* 8.3* 7.9*  PROT  --   --  5.7*  ALBUMIN  --   --  2.8*  AST  --   --  19  ALT  --   --  21  ALKPHOS  --   --  93  BILITOT  --   --  0.7  GFRNONAA 46* 49* 41*  GFRAA 54* 57* 47*  ANIONGAP 8 8 8      Hematology Recent Labs Lab 05/22/17 2032  WBC 11.5*  RBC 3.86*  HGB 11.6*  HCT 35.2*  MCV 91.2  MCH 30.1  MCHC 33.0  RDW 14.3  PLT 329    Cardiac Enzymes Recent Labs Lab 05/22/17 2205 05/23/17 0536  TROPONINI 0.04* 0.05*    Recent Labs  Lab 05/22/17 2104  TROPIPOC 0.05     BNP Recent Labs Lab 05/22/17 2039 05/22/17 2358  BNP 884.0* 908.0*     DDimer No results for input(s): DDIMER in the last 168 hours.   Radiology    Dg Chest 2 View  Result Date: 05/24/2017 CLINICAL DATA:  Shortness of Breath EXAM: CHEST  2 VIEW COMPARISON:  05/22/2017 FINDINGS: Prior CABG periods have small bilateral pleural effusions, right greater than left with bibasilar atelectasis or infiltrates. Calcification on the anterior wall of the heart again noted, stable. No acute bony abnormality. IMPRESSION: Continued bilateral effusions with bibasilar atelectasis or infiltrates. Stable cardiomegaly. Electronically Signed   By: Charlett Nose M.D.   On: 05/24/2017 09:25   Dg Chest 2 View  Result Date: 05/22/2017 CLINICAL DATA:  Shortness of breath with lower extremity edema EXAM: CHEST  2 VIEW COMPARISON:  May 13, 2017 FINDINGS: There is interstitial pulmonary edema. There is a small right pleural effusion. There is atelectatic change in both mid and lower lung zones. There is mild airspace consolidation in the left base and medial right base regions. There is cardiomegaly with mild pulmonary venous hypertension. There is aortic atherosclerosis. Patient is status post coronary artery bypass grafting. No evident adenopathy. There is degenerative change in the  thoracic spine. There is calcification along the anterior left ventricular wall. There is calcification left carotid artery. IMPRESSION: Evidence of a degree of congestive heart failure. Bibasilar atelectasis. Mild consolidation in the lung bases may be due to atelectasis but may also represent patchy bibasilar pneumonia. Stable cardiomegaly. Calcification along the anterior left ventricular wall is consistent with prior myocardial infarct in this area. There is aortic atherosclerosis. There is left carotid artery calcification. Aortic Atherosclerosis (ICD10-I70.0). Electronically Signed   By: Bretta Bang III M.D.   On: 05/22/2017 21:14   Dg Abdomen 1 View  Result Date: 05/22/2017 CLINICAL DATA:  Constipation for 1 week EXAM: ABDOMEN - 1 VIEW COMPARISON:  None. FINDINGS: There is moderate stool in the colon. There is no appreciable bowel dilatation or air-fluid level to suggest bowel obstruction. No free air. There are surgical clips in the right upper quadrant. IMPRESSION: Moderate stool in colon.  No bowel obstruction or free air. Electronically Signed   By: Bretta Bang III M.D.   On: 05/22/2017 21:11    Cardiac Studies   Echo 05/23/17- Study Conclusions  - Left ventricle: RWMA;s hard to see even with definity Septal and   apical akineis mid and basal inferior wall hypokinesis. The   cavity size was moderately dilated. Wall thickness was normal.   The estimated ejection fraction was 25%. Doppler parameters are   consistent with both elevated ventricular end-diastolic filling   pressure and elevated left atrial filling pressure. - Aortic valve: Given low EF overall AS at least moderate and   morphologically significant calcification and thickening with   restricted leaflet motion. Valve area (VTI): 1 cm^2. Valve area   (Vmax): 1.26 cm^2. Valve area (Vmean): 1.01 cm^2. - Left atrium: The atrium was moderately dilated. - Atrial septum: No defect or patent foramen ovale was  identified. - Pulmonary arteries: PA peak pressure: 42 mm Hg (S). - Pericardium, extracardiac: A trivial pericardial effusion was   identified. There was a left pleural effusion.  Patient Profile     83 y.o. male from [place] with a history of prior MI and PCI who was in [place] for a family reunion when he developed acute CHF and presented to [facility] 04/27/17.  Subsequent work up revealed severe 3V CAD and ICM (EF 25-30%). He ultimately underwent CABG 05/09/17 and was discharged 05/15/17. Post op course complicated by PAF and he went home on Eliquis and Amiodarone. He now presents with acute on chronic systolic CHF.   Assessment & Plan    Acute on chronic systolic CHF diuresed 1882 cc so far. Now on 40 mg BID. Was on 40 mg po at home, may need higher dose at D/C. Has appt to see Jacolyn Reedy in office next Wed.  CAD status post recent NSTEMI and CABG x 3 05/09/17  ICM EF 25-30%  Atrial fib post op with CVR on Amio and Eliquis, this may contributing to CHF/ LVD  Mild-moderate AS  HTN  HLD  OSA on CPAP  Plan: Increasing SCr- will hold ACE (B/P also low). When renal function and B/P stabilizes consider an ARB in preparation for Entresto as an OP. ultimatly plan for OP DCCV once he is volume stable.   Signed, Corine Shelter, PA-C  05/24/2017, 9:55 AM    The patient was seen and examined, and I agree with the history, physical exam, assessment and plan as documented above, with modifications as noted below. I have also personally reviewed all relevant documentation, old records, labs, and both radiographic and cardiovascular studies. I have also independently interpreted old and new ECG's.  Breathing has improved. HR is controlled. Patient and wife know importance of a sodium-restricted diet. Originally from [place] area and plan to go back in mid-September. Can be discharged on Lasix 60 mg and can take an extra 40 mg for weight gain of 3 lbs in 24 hours. If he has recurrent problems with CHF in  spite of adequate HR control, could then consider cardioversion, but not at this time. Has an appt with Leda Gauze PA-C in our office next week. Would benefit from cardiac rehab. No further recommendations. Discussed with hospitalist. Plan to discharge today.   Prentice Docker, MD, [facility]  05/24/2017 10:43 AM

## 2017-05-24 NOTE — Care Management Note (Addendum)
Case Management Note  Patient Details  Name: Jevion Kinnett MRN: [id number] Date of Birth: [birth year]  Expected Discharge Date:  05/24/17               Expected Discharge Plan:  Home/Self Care  In-House Referral:  NA  Discharge planning Services  CM Consult  Post Acute Care Choice:  NA Choice offered to:  NA  Status of Service:  Completed, signed off  Additional Comments: Pt discharging home today with self care and strong family support. Pt would like shower stool but want to check locally to find one on discount. They understand they can purchase one from DME supply store if unable to find one. Pt does not want HH services at this time due to cost as he has out of state insurance.   Malcolm Metro, RN 05/24/2017, 11:25 AM

## 2017-05-24 NOTE — Progress Notes (Signed)
Scott [name] discharged Home per MD order.  Discharge instructions reviewed and discussed with the patient, all questions and concerns answered. Copy of instructions and scripts given to patient.  Allergies as of 05/24/2017      Reactions   Nifedipine    UNSPECIFIED REACTION    Rabeprazole Sodium Nausea Only      Medication List    STOP taking these medications   enalapril 5 MG tablet Commonly known as:  VASOTEC     TAKE these medications   acetaminophen 500 MG tablet Commonly known as:  TYLENOL Take 2 tablets (1,000 mg total) by mouth every 6 (six) hours as needed.   allopurinol 100 MG tablet Commonly known as:  ZYLOPRIM Take 1 tablet (100 mg total) by mouth daily.   amiodarone 200 MG tablet Commonly known as:  PACERONE Take 1 tablet (200 mg total) by mouth 2 (two) times daily. What changed:  medication strength  how much to take  additional instructions   amiodarone 200 MG tablet Commonly known as:  PACERONE Take 1 tablet (200 mg total) by mouth daily. Start taking on:  05/30/2017 What changed:  You were already taking a medication with the same name, and this prescription was added. Make sure you understand how and when to take each.   apixaban 5 MG Tabs tablet Commonly known as:  ELIQUIS Take 1 tablet (5 mg total) by mouth 2 (two) times daily.   aspirin 81 MG chewable tablet Chew 81 mg by mouth daily.   CALCIUM CARBONATE PO Take 600 mg by mouth 2 (two) times daily.   carvedilol 6.25 MG tablet Commonly known as:  COREG Take 6.25 mg by mouth 2 (two) times daily with a meal.   fluticasone 0.05 % cream Commonly known as:  CUTIVATE Apply topically 2 (two) times daily.   furosemide 20 MG tablet Commonly known as:  LASIX Take 3 tablets (60 mg total) by mouth daily. What changed:  medication strength  how much to take  additional instructions   isosorbide mononitrate 30 MG 24 hr tablet Commonly known as:  IMDUR Take 30 mg by mouth daily.    levothyroxine 150 MCG tablet Commonly known as:  SYNTHROID, LEVOTHROID Take 150 mcg by mouth daily before breakfast.   Lutein 20 MG Caps Take 1 capsule by mouth every morning.   multivitamin tablet Take 1 tablet by mouth daily.   nitroGLYCERIN 0.4 MG SL tablet Commonly known as:  NITROSTAT Place 0.4 mg under the tongue every 5 (five) minutes as needed for chest pain.   Potassium Chloride ER 20 MEQ Tbcr Take 20 mEq by mouth daily.   pravastatin 40 MG tablet Commonly known as:  PRAVACHOL Take 40 mg by mouth at bedtime.   TH VITAMIN B12 100 MCG tablet Generic drug:  cyanocobalamin Take 1 tablet by mouth daily. Take 1 tablet by mouth in the evening.   traMADol 50 MG tablet Commonly known as:  ULTRAM Take 1 tablet (50 mg total) by mouth every 6 (six) hours as needed for moderate pain.   Ubiquinol 100 MG Caps Take 1 capsule by mouth every morning.   VITAMIN D-1000 MAX ST 1000 units tablet Generic drug:  Cholecalciferol Take 2,000 Units by mouth every morning.        IV site discontinued and catheter remains intact. Site without signs and symptoms of complications. Dressing and pressure applied.  Patient escorted to car by NT in a wheelchair,  no distress noted upon discharge.  Scott [name]  05/24/2017 7:50 PM

## 2017-05-24 NOTE — Telephone Encounter (Signed)
Patient currently admitted at [facility].

## 2017-05-24 NOTE — Care Management Important Message (Signed)
Important Message  Patient Details  Name: Scott [name] MRN: [id number] Date of Birth: October 21, 1926   Medicare Important Message Given:  Yes    Malcolm Metro, RN 05/24/2017, 11:25 AM

## 2017-05-24 NOTE — Discharge Instructions (Signed)
discharged on Lasix 60 mg and can take an extra 40 mg for weight gain of 3 lbs in 24 hours.

## 2017-05-24 NOTE — Discharge Summary (Signed)
Physician Discharge Summary  Scott [name] MRN: [id number] DOB/AGE: 1927/09/02 83 y.o.  PCP: System, Pcp Not In   Admit date: 05/22/2017 Discharge date: 05/24/2017  Discharge Diagnoses:    Principal Problem:   Systolic CHF, acute on chronic [facility]) Active Problems:   Acute respiratory failure with hypoxia (HCC)   Essential hypertension   Coronary artery disease   S/P CABG x 3   Constipation   Acute pulmonary edema (HCC)   Atrial fibrillation ([place])    Follow-up recommendations Follow-up with PCP in 3-5 days , including all  additional recommended appointments as below Follow-up CBC, CMP in 3-5 days discharged on Lasix 60 mg and can take an extra 40 mg for weight gain of 3 lbs in 24 hours. consider an ARB in preparation for Entresto as an OP ACE inhibitor held, and the setting of increasing creatinine and low blood pressure     Current Discharge Medication List    CONTINUE these medications which have CHANGED   Details  allopurinol (ZYLOPRIM) 100 MG tablet Take 1 tablet (100 mg total) by mouth daily. Qty: 30 tablet, Refills: 1    !! amiodarone (PACERONE) 200 MG tablet Take 1 tablet (200 mg total) by mouth 2 (two) times daily. Qty: 10 tablet, Refills: 0    !! amiodarone (PACERONE) 200 MG tablet Take 1 tablet (200 mg total) by mouth daily. Qty: 30 tablet, Refills: 11    furosemide (LASIX) 20 MG tablet Take 3 tablets (60 mg total) by mouth daily. Qty: 120 tablet, Refills: 1     !! - Potential duplicate medications found. Please discuss with provider.    CONTINUE these medications which have NOT CHANGED   Details  acetaminophen (TYLENOL) 500 MG tablet Take 2 tablets (1,000 mg total) by mouth every 6 (six) hours as needed. Qty: 100 tablet, Refills: 2    apixaban (ELIQUIS) 5 MG TABS tablet Take 1 tablet (5 mg total) by mouth 2 (two) times daily. Qty: 60 tablet, Refills: 3    aspirin 81 MG chewable tablet Chew 81 mg by mouth daily.    CALCIUM CARBONATE PO Take  600 mg by mouth 2 (two) times daily.    carvedilol (COREG) 6.25 MG tablet Take 6.25 mg by mouth 2 (two) times daily with a meal.    Cholecalciferol (VITAMIN D-1000 MAX ST) 1000 units tablet Take 2,000 Units by mouth every morning.    cyanocobalamin (TH VITAMIN B12) 100 MCG tablet Take 1 tablet by mouth daily. Take 1 tablet by mouth in the evening.    fluticasone (CUTIVATE) 0.05 % cream Apply topically 2 (two) times daily.    isosorbide mononitrate (IMDUR) 30 MG 24 hr tablet Take 30 mg by mouth daily.    levothyroxine (SYNTHROID, LEVOTHROID) 150 MCG tablet Take 150 mcg by mouth daily before breakfast.    Lutein 20 MG CAPS Take 1 capsule by mouth every morning.    Multiple Vitamin (MULTIVITAMIN) tablet Take 1 tablet by mouth daily.    nitroGLYCERIN (NITROSTAT) 0.4 MG SL tablet Place 0.4 mg under the tongue every 5 (five) minutes as needed for chest pain.    Potassium Chloride ER 20 MEQ TBCR Take 20 mEq by mouth daily. Qty: 7 tablet, Refills: 0   Associated Diagnoses: Localized edema    pravastatin (PRAVACHOL) 40 MG tablet Take 40 mg by mouth at bedtime.    traMADol (ULTRAM) 50 MG tablet Take 1 tablet (50 mg total) by mouth every 6 (six) hours as needed for moderate pain. Qty: 30 tablet,  Refills: 0    Ubiquinol 100 MG CAPS Take 1 capsule by mouth every morning.      STOP taking these medications     enalapril (VASOTEC) 5 MG tablet          Discharge Condition: stable  Discharge Instructions Get Medicines reviewed and adjusted: Please take all your medications with you for your next visit with your Primary MD  Please request your Primary MD to go over all hospital tests and procedure/radiological results at the follow up, please ask your Primary MD to get all Hospital records sent to his/her office.  If you experience worsening of your admission symptoms, develop shortness of breath, life threatening emergency, suicidal or homicidal thoughts you must seek medical attention  immediately by calling 911 or calling your MD immediately if symptoms less severe.  You must read complete instructions/literature along with all the possible adverse reactions/side effects for all the Medicines you take and that have been prescribed to you. Take any new Medicines after you have completely understood and accpet all the possible adverse reactions/side effects.   Do not drive when taking Pain medications.   Do not take more than prescribed Pain, Sleep and Anxiety Medications  Special Instructions: If you have smoked or chewed Tobacco in the last 2 yrs please stop smoking, stop any regular Alcohol and or any Recreational drug use.  Wear Seat belts while driving.  Please note  You were cared for by a hospitalist during your hospital stay. Once you are discharged, your primary care physician will handle any further medical issues. Please note that NO REFILLS for any discharge medications will be authorized once you are discharged, as it is imperative that you return to your primary care physician (or establish a relationship with a primary care physician if you do not have one) for your aftercare needs so that they can reassess your need for medications and monitor your lab values.  Discharge Instructions    Diet - low sodium heart healthy    Complete by:  As directed    Increase activity slowly    Complete by:  As directed        Allergies  Allergen Reactions  . Nifedipine     UNSPECIFIED REACTION   . Rabeprazole Sodium Nausea Only      Disposition: 06-Home-Health Care Svc   Consults:  cardiology   Significant Diagnostic Studies:  Dg Chest 2 View  Result Date: 05/24/2017 CLINICAL DATA:  Shortness of Breath EXAM: CHEST  2 VIEW COMPARISON:  05/22/2017 FINDINGS: Prior CABG periods have small bilateral pleural effusions, right greater than left with bibasilar atelectasis or infiltrates. Calcification on the anterior wall of the heart again noted, stable. No  acute bony abnormality. IMPRESSION: Continued bilateral effusions with bibasilar atelectasis or infiltrates. Stable cardiomegaly. Electronically Signed   By: Rolm Baptise M.D.   On: 05/24/2017 09:25   Dg Chest 2 View  Result Date: 05/22/2017 CLINICAL DATA:  Shortness of breath with lower extremity edema EXAM: CHEST  2 VIEW COMPARISON:  May 13, 2017 FINDINGS: There is interstitial pulmonary edema. There is a small right pleural effusion. There is atelectatic change in both mid and lower lung zones. There is mild airspace consolidation in the left base and medial right base regions. There is cardiomegaly with mild pulmonary venous hypertension. There is aortic atherosclerosis. Patient is status post coronary artery bypass grafting. No evident adenopathy. There is degenerative change in the thoracic spine. There is calcification along the anterior left ventricular  wall. There is calcification left carotid artery. IMPRESSION: Evidence of a degree of congestive heart failure. Bibasilar atelectasis. Mild consolidation in the lung bases may be due to atelectasis but may also represent patchy bibasilar pneumonia. Stable cardiomegaly. Calcification along the anterior left ventricular wall is consistent with prior myocardial infarct in this area. There is aortic atherosclerosis. There is left carotid artery calcification. Aortic Atherosclerosis (ICD10-I70.0). Electronically Signed   By: Lowella Grip III M.D.   On: 05/22/2017 21:14   Dg Chest 2 View  Result Date: 05/13/2017 CLINICAL DATA:  Atelectasis.  History of coronary artery disease. EXAM: CHEST  2 VIEW COMPARISON:  05/12/2017 FINDINGS: Slightly increased densities at left lung base suggestive for increased atelectasis and possibly increased pleural fluid. Hazy curvilinear density in the right upper chest may represent an overlying structure. Cardiac silhouette remains enlarged with post CABG changes. No evidence for a large pneumothorax. Improved aeration  in the right lower lung. IMPRESSION: Slightly increased densities at left lung base suggestive for atelectasis and possible pleural fluid. Improved aeration at the right lung base. Densities in the right upper chest probably represent overlying shadows rather than true lung disease. Recommend attention on follow-up. Electronically Signed   By: Markus Daft M.D.   On: 05/13/2017 08:09   Dg Abdomen 1 View  Result Date: 05/22/2017 CLINICAL DATA:  Constipation for 1 week EXAM: ABDOMEN - 1 VIEW COMPARISON:  None. FINDINGS: There is moderate stool in the colon. There is no appreciable bowel dilatation or air-fluid level to suggest bowel obstruction. No free air. There are surgical clips in the right upper quadrant. IMPRESSION: Moderate stool in colon.  No bowel obstruction or free air. Electronically Signed   By: Lowella Grip III M.D.   On: 05/22/2017 21:11   Ct Angio Chest Pe W Or Wo Contrast  Result Date: 04/27/2017 CLINICAL DATA:  83 year old male with 2 hour history of extreme shortness of breath. EXAM: CT ANGIOGRAPHY CHEST WITH CONTRAST TECHNIQUE: Multidetector CT imaging of the chest was performed using the standard protocol during bolus administration of intravenous contrast. Multiplanar CT image reconstructions and MIPs were obtained to evaluate the vascular anatomy. CONTRAST:  100 mL of Isovue 370. COMPARISON:  No priors. FINDINGS: Cardiovascular: No filling defects in the pulmonary tail tree to suggest underlying pulmonary embolism. Heart size is mildly enlarged. There is no significant pericardial fluid, thickening or pericardial calcification. There is aortic atherosclerosis, as well as atherosclerosis of the great vessels of the mediastinum and the coronary arteries, including calcified atherosclerotic plaque in the left main, left anterior descending, left circumflex and right coronary arteries. Extensive thinning and dystrophic calcifications in the left ventricle involving anterior, anteroseptal  and septal wall segments throughout the mid ventricle and apex compatible with extensive remottling and scarring following LAD territory myocardial infarction(s). Calcifications of the aortic valve. Ectasia of ascending thoracic aorta (4.3 cm in diameter). Mediastinum/Nodes: No pathologically enlarged mediastinal or hilar lymph nodes. Esophagus is unremarkable in appearance. No axillary lymphadenopathy. Lungs/Pleura: Small bilateral pleural effusions lying dependently. Widespread [name]-glass attenuation and interlobular septal thickening throughout the lungs bilaterally is most compatible with a background of interstitial pulmonary edema. No confluent consolidative airspace disease. No definite suspicious appearing pulmonary nodules or masses are noted. Upper Abdomen: Aortic atherosclerosis. 4.1 cm exophytic simple cyst in the upper pole of the left kidney. Colonic diverticulosis. Status post cholecystectomy. Musculoskeletal: There are no aggressive appearing lytic or blastic lesions noted in the visualized portions of the skeleton. Review of the MIP images confirms the  above findings. IMPRESSION: 1. No evidence of pulmonary embolism. 2. There are findings suggestive of congestive heart failure, as discussed above. 3. Aortic atherosclerosis, in addition to left main and 3 vessel coronary artery disease. In addition, there is evidence of prior distal LAD territory myocardial infarction(s) with extensive scarring in the left ventricle. 4. There are calcifications of the aortic valve. Echocardiographic correlation for evaluation of potential valvular dysfunction may be warranted if clinically indicated. 5. Ectasia of the ascending thoracic aorta (4.3 cm in diameter). Recommend annual imaging followup by CTA or MRA. This recommendation follows 2010 ACCF/AHA/AATS/ACR/ASA/SCA/SCAI/SIR/STS/SVM Guidelines for the Diagnosis and Management of Patients with Thoracic Aortic Disease. Circulation. 2010; 121: [id number]. 6.  Colonic diverticulosis. Aortic Atherosclerosis (ICD10-I70.0). Electronically Signed   By: Vinnie Langton M.D.   On: 04/27/2017 09:01   Dg Chest Port 1 View  Result Date: 05/12/2017 CLINICAL DATA:  Status post CABG EXAM: PORTABLE CHEST 1 VIEW COMPARISON:  05/11/2017 FINDINGS: Sternotomy wires overlie normal cardiac silhouette. Interval removal of LEFT and RIGHT chest tubes. No pneumothorax. Improved LEFT basilar atelectasis. IMPRESSION: 1. Removal of chest tubes without pneumothorax. 2. Improvement in LEFT basilar atelectasis. Electronically Signed   By: Suzy Bouchard M.D.   On: 05/12/2017 07:59   Dg Chest Port 1 View  Result Date: 05/11/2017 CLINICAL DATA:  Chest tube present s/p CABG EXAM: PORTABLE CHEST - 1 VIEW COMPARISON:  the previous day's study FINDINGS: The Swan-Ganz catheter has been retracted, leaving the IJ venous sheath in place. Bilateral chest tubes stable in position, no pneumothorax. Low lung volumes as before with patchy atelectasis or consolidation in the lung bases left greater than right, slightly improved. Heart size upper limits normal for technique. Previous median sternotomy and CABG. Suspect small pleural effusions. IMPRESSION: 1. Bibasilar atelectasis or infiltrate, left greater than right, slightly improved 2. Stable chest tubes, no pneumothorax. Electronically Signed   By: Lucrezia Europe M.D.   On: 05/11/2017 07:35   Dg Chest Port 1 View  Result Date: 05/10/2017 CLINICAL DATA:  Status post CABG yesterday EXAM: PORTABLE CHEST 1 VIEW COMPARISON:  Portable chest x-ray of May 09, 2017 FINDINGS: There has been interval extubation of the trachea and esophagus. The lungs are less well inflated today. There is no pneumothorax. A trace of pleural fluid on the left is suspected. The cardiac silhouette is enlarged. The central pulmonary vascularity is mildly prominent but no definite cephalization is observed. The Swan-Ganz catheter tip projects in the right main pulmonary outflow tract.  The mediastinal drain in the bilateral chest tubes are in stable position. The sternal wires are intact. There is moderate gaseous distention of the stomach. IMPRESSION: Interval extubation of the trachea with mild hypoinflation. Left basilar atelectasis and small left pleural effusion. No pneumothorax. Cardiomegaly without significant pulmonary vascular congestion. Electronically Signed   By: David  Martinique M.D.   On: 05/10/2017 07:27   Dg Chest Port 1 View  Result Date: 05/09/2017 CLINICAL DATA:  Status post CABG. EXAM: PORTABLE CHEST 1 VIEW COMPARISON:  05/08/2017. FINDINGS: Endotracheal tube tip noted 6.1 cm above the carina. NG tube tip noted below the left hemidiaphragm. Swan-Ganz catheter noted with tip in the pulmonary outflow tract . Mediastinum drainage catheter in good anatomic position. Bilateral chest tubes are noted. Miniscule left apical pneumothorax cannot be excluded. Prior CABG. Stable cardiomegaly. Mild bibasilar subsegmental atelectasis. No pleural effusion. IMPRESSION: 1. Lines and tubes bilateral chest tubes in good anatomic position as above. Tiny left apical pneumothorax cannot be excluded. 2. Prior CABG.  Stable cardiomegaly. 3. Mild bibasilar atelectasis . Critical Value/emergent results were called by telephone at the time of interpretation on 05/09/2017 at 4:24 pm to nurse Varney Biles, who verbally acknowledged these results. Electronically Signed   By: Marcello Moores  Register   On: 05/09/2017 16:25   Dg Chest Port 1 View  Result Date: 05/08/2017 CLINICAL DATA:  83 year old male preoperative study for CABG. EXAM: PORTABLE CHEST 1 VIEW COMPARISON:  CTA chest and portable chest radiograph 04/27/2017. FINDINGS: Portable AP semi upright view at 2143 hours. Pulmonary interstitial edema has resolved. Stable cardiomegaly and mediastinal contours. Calcified aortic atherosclerosis. Visualized tracheal air column is within normal limits. No pneumothorax or confluent pulmonary opacity. No residual pleural  effusion is evident. Negative visible bowel gas pattern. IMPRESSION: Interval resolved pulmonary edema. Stable cardiomegaly. No new cardiopulmonary abnormality. Electronically Signed   By: Genevie Ann M.D.   On: 05/08/2017 22:01   Dg Chest Port 1 View  Result Date: 04/27/2017 CLINICAL DATA:  Shortness of breath and chest pain EXAM: PORTABLE CHEST 1 VIEW COMPARISON:  None. FINDINGS: There are extensive bilateral interstitial opacities. Cardiomediastinal contours are normal. No pneumothorax or sizable pleural effusion. IMPRESSION: Extensive bilateral interstitial opacities which may indicate moderate interstitial pulmonary edema. In the absence of prior studies for comparison, interstitial lung disease would be difficult to exclude. Electronically Signed   By: Ulyses Jarred M.D.   On: 04/27/2017 05:45    echocardiogram   ------------------------------------------------------------------- LV EF: 25%  ------------------------------------------------------------------- Indications:      Abnormal EKG 794.31.  ------------------------------------------------------------------- History:   PMH:  Acquired from the patient and from the patient&'s chart.  Atrial flutter.  Coronary artery disease.  Congestive heart failure.  PMH:  NSTEMI (non-ST elevated myocardial infarction) Ischemic dilated cardiomyopathy Acute respiratory failure with hypoxia  Myocardial infarction.  Risk factors:  Hypertension. Dyslipidemia.  ------------------------------------------------------------------- Study Conclusions  - Left ventricle: RWMA;s hard to see even with definity Septal and   apical akineis mid and basal inferior wall hypokinesis. The   cavity size was moderately dilated. Wall thickness was normal.   The estimated ejection fraction was 25%. Doppler parameters are   consistent with both elevated ventricular end-diastolic filling   pressure and elevated left atrial filling pressure. - Aortic valve: Given low  EF overall AS at least moderate and   morphologically significant calcification and thickening with   restricted leaflet motion. Valve area (VTI): 1 cm^2. Valve area   (Vmax): 1.26 cm^2. Valve area (Vmean): 1.01 cm^2. - Left atrium: The atrium was moderately dilated. - Atrial septum: No defect or patent foramen ovale was identified. - Pulmonary arteries: PA peak pressure: 42 mm Hg (S). - Pericardium, extracardiac: A trivial pericardial effusion was   identified. There was a left pleural effusion.   Filed Weights   05/22/17 2013 05/22/17 2318 05/23/17 0500  Weight: 85.3 kg (188 lb) 88.8 kg (195 lb 12.3 oz) 88.8 kg (195 lb 12.3 oz)     Microbiology: Recent Results (from the past 240 hour(s))  Blood Culture (routine x 2)     Status: None (Preliminary result)   Collection Time: 05/22/17  9:57 PM  Result Value Ref Range Status   Specimen Description BLOOD LEFT HAND  Final   Special Requests   Final    BOTTLES DRAWN AEROBIC AND ANAEROBIC Blood Culture adequate volume   Culture NO GROWTH 2 DAYS  Final   Report Status PENDING  Incomplete  Blood Culture (routine x 2)     Status: None (Preliminary result)   Collection  Time: 05/22/17 10:05 PM  Result Value Ref Range Status   Specimen Description BLOOD  Final   Special Requests NONE  Final   Culture NO GROWTH 2 DAYS  Final   Report Status PENDING  Incomplete       Blood Culture    Component Value Date/Time   SDES BLOOD 05/22/2017 2205   SPECREQUEST NONE 05/22/2017 2205   CULT NO GROWTH 2 DAYS 05/22/2017 2205   REPTSTATUS PENDING 05/22/2017 2205      Labs: Results for orders placed or performed during the hospital encounter of 05/22/17 (from the past 48 hour(s))  Basic metabolic panel     Status: Abnormal   Collection Time: 05/22/17  8:32 PM  Result Value Ref Range   Sodium 132 (L) 135 - 145 mmol/L   Potassium 4.4 3.5 - 5.1 mmol/L   Chloride 95 (L) 101 - 111 mmol/L   CO2 29 22 - 32 mmol/L   Glucose, Bld 102 (H) 65 - 99  mg/dL   BUN 25 (H) 6 - 20 mg/dL   Creatinine, Ser 1.32 (H) 0.61 - 1.24 mg/dL   Calcium 8.6 (L) 8.9 - 10.3 mg/dL   GFR calc non Af Amer 46 (L) >60 mL/min   GFR calc Af Amer 54 (L) >60 mL/min    Comment: (NOTE) The eGFR has been calculated using the CKD EPI equation. This calculation has not been validated in all clinical situations. eGFR's persistently <60 mL/min signify possible Chronic Kidney Disease.    Anion gap 8 5 - 15  CBC with Differential     Status: Abnormal   Collection Time: 05/22/17  8:32 PM  Result Value Ref Range   WBC 11.5 (H) 4.0 - 10.5 K/uL   RBC 3.86 (L) 4.22 - 5.81 MIL/uL   Hemoglobin 11.6 (L) 13.0 - 17.0 g/dL   HCT 35.2 (L) 39.0 - 52.0 %   MCV 91.2 78.0 - 100.0 fL   MCH 30.1 26.0 - 34.0 pg   MCHC 33.0 30.0 - 36.0 g/dL   RDW 14.3 11.5 - 15.5 %   Platelets 329 150 - 400 K/uL   Neutrophils Relative % 81 %   Neutro Abs 9.3 (H) 1.7 - 7.7 K/uL   Lymphocytes Relative 7 %   Lymphs Abs 0.8 0.7 - 4.0 K/uL   Monocytes Relative 8 %   Monocytes Absolute 0.9 0.1 - 1.0 K/uL   Eosinophils Relative 4 %   Eosinophils Absolute 0.4 0.0 - 0.7 K/uL   Basophils Relative 0 %   Basophils Absolute 0.0 0.0 - 0.1 K/uL  Brain natriuretic peptide     Status: Abnormal   Collection Time: 05/22/17  8:39 PM  Result Value Ref Range   B Natriuretic Peptide 884.0 (H) 0.0 - 100.0 pg/mL  I-stat troponin, ED     Status: None   Collection Time: 05/22/17  9:04 PM  Result Value Ref Range   Troponin i, poc 0.05 0.00 - 0.08 ng/mL   Comment 3            Comment: Due to the release kinetics of cTnI, a negative result within the first hours of the onset of symptoms does not rule out myocardial infarction with certainty. If myocardial infarction is still suspected, repeat the test at appropriate intervals.   Blood Culture (routine x 2)     Status: None (Preliminary result)   Collection Time: 05/22/17  9:57 PM  Result Value Ref Range   Specimen Description BLOOD LEFT HAND  Special Requests       BOTTLES DRAWN AEROBIC AND ANAEROBIC Blood Culture adequate volume   Culture NO GROWTH 2 DAYS    Report Status PENDING   Blood Culture (routine x 2)     Status: None (Preliminary result)   Collection Time: 05/22/17 10:05 PM  Result Value Ref Range   Specimen Description BLOOD    Special Requests NONE    Culture NO GROWTH 2 DAYS    Report Status PENDING   Troponin I     Status: Abnormal   Collection Time: 05/22/17 10:05 PM  Result Value Ref Range   Troponin I 0.04 (HH) <0.03 ng/mL    Comment: CRITICAL RESULT CALLED TO, READ BACK BY AND VERIFIED WITH: TETREAULT,S@0021  BY MATTHEWS,B 8.15.18   Brain natriuretic peptide     Status: Abnormal   Collection Time: 05/22/17 11:58 PM  Result Value Ref Range   B Natriuretic Peptide 908.0 (H) 0.0 - 100.0 pg/mL  Basic metabolic panel     Status: Abnormal   Collection Time: 05/23/17  5:36 AM  Result Value Ref Range   Sodium 134 (L) 135 - 145 mmol/L   Potassium 3.7 3.5 - 5.1 mmol/L   Chloride 93 (L) 101 - 111 mmol/L   CO2 33 (H) 22 - 32 mmol/L   Glucose, Bld 98 65 - 99 mg/dL   BUN 24 (H) 6 - 20 mg/dL   Creatinine, Ser 1.26 (H) 0.61 - 1.24 mg/dL   Calcium 8.3 (L) 8.9 - 10.3 mg/dL   GFR calc non Af Amer 49 (L) >60 mL/min   GFR calc Af Amer 57 (L) >60 mL/min    Comment: (NOTE) The eGFR has been calculated using the CKD EPI equation. This calculation has not been validated in all clinical situations. eGFR's persistently <60 mL/min signify possible Chronic Kidney Disease.    Anion gap 8 5 - 15  Troponin I     Status: Abnormal   Collection Time: 05/23/17  5:36 AM  Result Value Ref Range   Troponin I 0.05 (HH) <0.03 ng/mL    Comment: CRITICAL RESULT CALLED TO, READ BACK BY AND VERIFIED WITH: [facility] AT 8:20AM ON 05/23/17 BY FESTERMAN,C   Magnesium     Status: None   Collection Time: 05/23/17  5:36 AM  Result Value Ref Range   Magnesium 2.1 1.7 - 2.4 mg/dL  Comprehensive metabolic panel     Status: Abnormal   Collection Time:  05/24/17  8:00 AM  Result Value Ref Range   Sodium 132 (L) 135 - 145 mmol/L   Potassium 3.7 3.5 - 5.1 mmol/L   Chloride 92 (L) 101 - 111 mmol/L   CO2 32 22 - 32 mmol/L   Glucose, Bld 103 (H) 65 - 99 mg/dL   BUN 30 (H) 6 - 20 mg/dL   Creatinine, Ser 1.47 (H) 0.61 - 1.24 mg/dL   Calcium 7.9 (L) 8.9 - 10.3 mg/dL   Total Protein 5.7 (L) 6.5 - 8.1 g/dL   Albumin 2.8 (L) 3.5 - 5.0 g/dL   AST 19 15 - 41 U/L   ALT 21 17 - 63 U/L   Alkaline Phosphatase 93 38 - 126 U/L   Total Bilirubin 0.7 0.3 - 1.2 mg/dL   GFR calc non Af Amer 41 (L) >60 mL/min   GFR calc Af Amer 47 (L) >60 mL/min    Comment: (NOTE) The eGFR has been calculated using the CKD EPI equation. This calculation has not been validated in all clinical situations. eGFR's  persistently <60 mL/min signify possible Chronic Kidney Disease.    Anion gap 8 5 - 15     Lipid Panel  No results found for: CHOL, TRIG, HDL, CHOLHDL, VLDL, LDLCALC, LDLDIRECT   Lab Results  Component Value Date   HGBA1C 5.7 (H) 05/09/2017   HGBA1C 5.5 04/27/2017            History of Present Illness:   83  Yo Mr. Birdwell white male with known CAD. He suffered in MI in 1987 with subsequent PCI with stent placement in 1999 and 2013 or 2014. He also has a history of CHF, OSA, Hypertension, Hypothyroidism. He is here visiting from [place]. 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 He underwent CABG x 3 utilizing LIMA to OM1, Free RIMA to Ramus Intermediate, and Right radial artery to PDA. Postop had bradycardia so Coreg was stopped then developed atrial fibrillation and was placed on amiodarone.   Patient was discharge 05/13/17. He was readmitted last night with acute respiratory failure with hypoxia secondary to CHF and question of pneumonia. Discharge weight 188 pounds today's weight 195 pounds. BNP over 900. May have gotten extra salt in his diet. Noticed fluid  build up Monday and lasix was continued   HOSPITAL COURSE:    Systolic CHF, acute on chronic [facility])- suspect his symptoms are mostly due to chf exac.  .  continue diuresis with lasix, changed 82m iv q 12 hours to 60 mg PO daily , due to increasing creatinine today. Patient is currently on room air. Atrial fibrillation could be contributing  echo  Shows EF of   25%. take an extra 40 mg for weight gain of 3 lbs in 24 hours.  will hold ACE (B/P also low). When renal function and B/P stabilizes consider an ARB in preparation for Entresto as an OP   Atrial fib post op with CVR on Amio and Eliquis,  If he has recurrent problems with CHF in spite of adequate HR control, could then consider cardioversion, but not at this time  Acute respiratory failure with [place]  improving, likely due to chf but cannot rule out a component of infection. initially received empiricabx vanc/cefepime . Initial white blood cell count  12,. Repeat chest x-ray shows continued bilateral pleural effusions, no pneumonia. We'll discontinue antibiotics.   Essential hypertension- stable, cont home meds  Coronary artery disease- noted   OSA on CPAP  Mild-moderate AS no gradient, likely not contributing   Discharge Exam:   Blood pressure (!) 97/51, pulse 83, temperature 97.7 F (36.5 C), temperature source Oral, resp. rate 20, height 5' 10"  (1.778 m), weight 88.8 kg (195 lb 12.3 oz), SpO2 94 %.   Cardiac:  irreg irreg distant HS, 1-2/6 sys murmur LSB Lungs:  Decreased breath sounds with rales at bases  Abd: soft, nontender, no hepatomegaly  Ext: plus 1  edema Musculoskeletal:  No deformities, BUE and BLE strength normal and equal Skin: warm and dry incisions healing well Neuro:  CNs 2-12 intact, no focal abnormalities noted   Follow-up Information    KHerminio Commons MD. Call.   Specialty:  Cardiology Why:  to confirm appoitnment for next wednesday, check BMP Contact information: [phone]          Signed: NReyne Dumas8/16/2018, 11:02 AM        Time  spent >1 hour

## 2017-05-27 LAB — CULTURE, BLOOD (ROUTINE X 2)
CULTURE: NO GROWTH
Culture: NO GROWTH
SPECIAL REQUESTS: ADEQUATE

## 2017-05-29 NOTE — Progress Notes (Signed)
Cardiology Office Note    Date:  05/30/2017   ID:  Scott [name], DOB [birth year], MRN [id number]  PCP:  System, Pcp Not In  Cardiologist: Dr. Berline Chough  Chief Complaint  Patient presents with  . Follow-up    History of Present Illness:  Scott [name] is a 83 y.o. male white male with known CAD. He suffered in MI in 1987 with subsequent PCI with stent placement in 1999 and 2013 or 2014. He also has a history of CHF, OSA, Hypertension, Hypothyroidism. He is here visiting from [place]. 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 He underwent CABG x 3 utilizing LIMA to OM1, Free RIMA to Ramus Intermediate, and Right radial artery to PDA. Postop had bradycardia so Coreg was stopped then developed atrial fibrillation and was placed on amiodarone.   Patient was discharge 05/13/17. [facility] with acute respiratory failure with hypoxia secondary to CHF and question of pneumonia. BNP was over 900. He may gotten extra salt in his diet. His creatinine began to increase and BP was low so ACE inhibitor was held. Ultimate plan for outpatient DC CV once his volume is stabilized. 2-D echo 05/23/17 estimated EF 25% with moderate aortic stenosis. See report below for details.Crt 1.47 at discharge.  Patient comes in today accompanied by his wife. He thinks he is normal rhythm. He is feeling better. His wife states that he's having trouble with his CPAP machine still is short of breath. They're watching his sodium intake closely. He like to start cardiac rehabilitation. The think they'll stay here longer and wait to see surgeon's say he can go back to [place].   Past Medical History:  Diagnosis Date  . Angina pectoris (HCC)   . Chronic combined systolic and diastolic heart failure, NYHA class 2 (HCC) 2014   EF reportedly anywhere from 25-40%  . Coronary artery disease involving  native coronary artery    Reportedly multivessel  . Essential hypertension   . Gout   . History of MI (myocardial infarction)   . Hyperlipemia   . Hyperlipidemia   . Hypothyroidism   . Ischemic dilated cardiomyopathy (HCC)   . Multi-vessel coronary artery stenosis   . Varicose veins of both lower extremities     Past Surgical History:  Procedure Laterality Date  . CHOLECYSTECTOMY    . CORONARY ARTERY BYPASS GRAFT N/A 05/09/2017   Procedure: CORONARY ARTERY BYPASS GRAFTING (CABG) times 3 using the right radial artery and bilateral internal mammary arteries.;  Surgeon: Loreli Slot, MD;  Location: [facility] OR;  Service: Open Heart Surgery;  Laterality: N/A;  . LEFT HEART CATH AND CORONARY ANGIOGRAPHY N/A 04/30/2017   Procedure: Left Heart Cath and Coronary Angiography;  Surgeon: Runell Gess, MD;  Location: [facility] INVASIVE CV LAB;  Service: Cardiovascular;  Laterality: N/A;  . RADIAL ARTERY HARVEST Right 05/09/2017   Procedure: RADIAL ARTERY HARVEST;  Surgeon: Loreli Slot, MD;  Location: [facility] OR;  Service: Open Heart Surgery;  Laterality: Right;  . TEE WITHOUT CARDIOVERSION N/A 05/04/2017   Procedure: TRANSESOPHAGEAL ECHOCARDIOGRAM (TEE);  Surgeon: Quintella Reichert, MD;  Location: [facility] ENDOSCOPY;  Service: Cardiovascular;  Laterality: N/A;  . TEE WITHOUT CARDIOVERSION N/A 05/09/2017   Procedure: TRANSESOPHAGEAL ECHOCARDIOGRAM (TEE);  Surgeon: Loreli Slot, MD;  Location: [facility] OR;  Service: Open Heart Surgery;  Laterality: N/A;  . VASCULAR SURGERY  Vein stripping    Current Medications: Current Meds  Medication Sig  . acetaminophen (TYLENOL) 500 MG tablet Take 2 tablets (1,000 mg total) by mouth every 6 (six) hours as needed.  Marland Kitchen allopurinol (ZYLOPRIM) 100 MG tablet Take 1 tablet (100 mg total) by mouth daily.  Marland Kitchen amiodarone (PACERONE) 200 MG tablet Take 1 tablet (200 mg total) by mouth daily.  Marland Kitchen apixaban (ELIQUIS) 5 MG TABS tablet Take 1 tablet (5 mg total) by mouth 2 (two)  times daily.  Marland Kitchen aspirin 81 MG chewable tablet Chew 81 mg by mouth daily.  Marland Kitchen CALCIUM CARBONATE PO Take 600 mg by mouth 2 (two) times daily.  . carvedilol (COREG) 6.25 MG tablet Take 6.25 mg by mouth 2 (two) times daily with a meal.  . Cholecalciferol (VITAMIN D-1000 MAX ST) 1000 units tablet Take 2,000 Units by mouth every morning.  . cyanocobalamin (TH VITAMIN B12) 100 MCG tablet Take 1 tablet by mouth daily. Take 1 tablet by mouth in the evening.  . fluticasone (CUTIVATE) 0.05 % cream Apply topically 2 (two) times daily.  . furosemide (LASIX) 20 MG tablet Take 3 tablets (60 mg total) by mouth daily.  . isosorbide mononitrate (IMDUR) 30 MG 24 hr tablet Take 30 mg by mouth daily.  Marland Kitchen levothyroxine (SYNTHROID, LEVOTHROID) 150 MCG tablet Take 150 mcg by mouth daily before breakfast.  . Lutein 20 MG CAPS Take 1 capsule by mouth every morning.  . Multiple Vitamin (MULTIVITAMIN) tablet Take 1 tablet by mouth daily.  . nitroGLYCERIN (NITROSTAT) 0.4 MG SL tablet Place 0.4 mg under the tongue every 5 (five) minutes as needed for chest pain.  Marland Kitchen Potassium Chloride ER 20 MEQ TBCR Take 20 mEq by mouth daily.  . pravastatin (PRAVACHOL) 40 MG tablet Take 40 mg by mouth at bedtime.  . traMADol (ULTRAM) 50 MG tablet Take 1 tablet (50 mg total) by mouth every 6 (six) hours as needed for moderate pain.  Marland Kitchen Ubiquinol 100 MG CAPS Take 1 capsule by mouth every morning.     Allergies:   Nifedipine and Rabeprazole sodium   Social History   Social History  . Marital status: Married    Spouse name: N/A  . Number of children: 3  . Years of education: N/A   Social History Main Topics  . Smoking status: Never Smoker  . Smokeless tobacco: Never Used  . Alcohol use Yes     Comment: Social  . Drug use: No  . Sexual activity: No   Other Topics Concern  . None   Social History Narrative   Remarried, with current wife 1 year. Has 3 children with his former wife. Visiting from [place].     Family History:  The  patient's family history includes Bladder Cancer in his brother and father; Heart disease in his mother; Hypertension in his son.   ROS:   Please see the history of present illness.    Review of Systems  Constitution: Negative.  HENT: Negative.   Cardiovascular: Positive for dyspnea on exertion, irregular heartbeat and leg swelling.  Respiratory: Positive for sleep disturbances due to breathing.   Endocrine: Negative.   Hematologic/Lymphatic: Negative.   Musculoskeletal: Negative.   Gastrointestinal: Positive for constipation.  Genitourinary: Negative.   Neurological: Positive for loss of balance.   All other systems reviewed and are negative.   PHYSICAL EXAM:   VS:  BP 106/62 (BP Location: Left Arm)   Pulse 100   Ht 5\' 10"  (1.778 m)   Wt 194  lb (88 kg)   SpO2 96%   BMI 27.84 kg/m   Physical Exam  GEN: Well nourished, well developed, in no acute distress  Neck: slight increase JVD, no carotid bruits, or masses Cardiac: Incisions healing well Irregular irregular with 2/6 systolic murmur at the left sternal border Respiratory:  Decreased breath sounds at the right lung base clear elsewhere GI: soft, nontender, nondistended, + BS Ext: Trace of ankle edema bilaterally otherwise without cyanosis, clubbing,  Good distal pulses bilaterally Neuro:  Alert and Oriented x 3 Psych: euthymic mood, full affect  Wt Readings from Last 3 Encounters:  05/30/17 194 lb (88 kg)  05/23/17 195 lb 12.3 oz (88.8 kg)  05/15/17 188 lb 11.2 oz (85.6 kg)      Studies/Labs Reviewed:   EKG:  EKG is  ordered today.  The ekg ordered today demonstrates Atrial fibrillation at 84 bpm right bundle branch block, no acute change  Recent Labs: 04/27/2017: TSH 0.713 05/22/2017: B Natriuretic Peptide 908.0; Hemoglobin 11.6; Platelets 329 05/23/2017: Magnesium 2.1 05/24/2017: ALT 21; BUN 30; Creatinine, Ser 1.47; Potassium 3.7; Sodium 132   Lipid Panel No results found for: CHOL, TRIG, HDL, CHOLHDL, VLDL,  LDLCALC, LDLDIRECT  Additional studies/ records that were reviewed today include:  Echo 05/23/17- Study Conclusions   - Left ventricle: RWMA;s hard to see even with definity Septal and   apical akineis mid and basal inferior wall hypokinesis. The   cavity size was moderately dilated. Wall thickness was normal.   The estimated ejection fraction was 25%. Doppler parameters are   consistent with both elevated ventricular end-diastolic filling   pressure and elevated left atrial filling pressure. - Aortic valve: Given low EF overall AS at least moderate and   morphologically significant calcification and thickening with   restricted leaflet motion. Valve area (VTI): 1 cm^2. Valve area   (Vmax): 1.26 cm^2. Valve area (Vmean): 1.01 cm^2. - Left atrium: The atrium was moderately dilated. - Atrial septum: No defect or patent foramen ovale was identified. - Pulmonary arteries: PA peak pressure: 42 mm Hg (S). - Pericardium, extracardiac: A trivial pericardial effusion was   identified. There was a left pleural effusion.    ASSESSMENT:    1. S/P CABG x 3   2. Acute on chronic combined systolic and diastolic CHF (congestive heart failure) (HCC)   3. Ischemic dilated cardiomyopathy (HCC)   4. Essential hypertension   5. Nonrheumatic aortic valve stenosis   6. Paroxysmal atrial fibrillation (HCC)      PLAN:  In order of problems listed above:  Status post CABG 3 05/09/17 after recent NSTEMI slow to recover with hospitalization for CHF. Would like to start cardiac rehabilitation while he still here in [place]. We'll place the order as I think it would benefit him.Will have f/u with Dr. Purvis Sheffield who saw him in the hospital.  Acute on chronic CHF requiring hospitalization LVEF 25% follow-up 2-D echo 05/23/17 lower than before which was 30-35%. Currently on Coreg 6.25 mg twice a day Lasix 60 mg daily and Imdur. Will not add ACE inhibitor with most recent renal function. We'll recheck  today.  Ischemic dilated cardiomyopathy EF 25% hopefully will recover once in normal sinus rhythm  Essential hypertension blood pressure was low in the hospital and creatinine began to climb so ACE inhibitor was held  Moderate aortic stenosis most recent echo follow  PAF on amiodarone and Eliquis. Plan is for him to continue for now and possibly consider outpatient cardioversion down the road  if he doesn't convert on his own and once volume status stabilizes    Medication Adjustments/Labs and Tests Ordered: Current medicines are reviewed at length with the patient today.  Concerns regarding medicines are outlined above.  Medication changes, Labs and Tests ordered today are listed in the Patient Instructions below. There are no Patient Instructions on file for this visit.   Elson Clan, PA-C  05/30/2017 12:32 PM    [facility] [address] [place], [place], [place]  [address] Phone: [phone]; Fax: [phone]

## 2017-05-30 ENCOUNTER — Encounter: Payer: Self-pay | Admitting: Physician Assistant

## 2017-05-30 ENCOUNTER — Other Ambulatory Visit (HOSPITAL_COMMUNITY)
Admission: RE | Admit: 2017-05-30 | Discharge: 2017-05-30 | Disposition: A | Discharge status: Home / Self Care | Payer: Medicare (Managed Care) | Source: Ambulatory Visit | Attending: Physician Assistant | Admitting: Physician Assistant

## 2017-05-30 ENCOUNTER — Ambulatory Visit (INDEPENDENT_AMBULATORY_CARE_PROVIDER_SITE_OTHER): Payer: Medicare (Managed Care) | Admitting: Physician Assistant

## 2017-05-30 VITALS — BP 106/62 | HR 100 | Ht 70.0 in | Wt 194.0 lb

## 2017-05-30 DIAGNOSIS — I350 Nonrheumatic aortic (valve) stenosis: Secondary | ICD-10-CM | POA: Diagnosis not present

## 2017-05-30 DIAGNOSIS — I480 Paroxysmal atrial fibrillation: Secondary | ICD-10-CM | POA: Diagnosis not present

## 2017-05-30 DIAGNOSIS — I10 Essential (primary) hypertension: Secondary | ICD-10-CM | POA: Diagnosis not present

## 2017-05-30 DIAGNOSIS — I5043 Acute on chronic combined systolic (congestive) and diastolic (congestive) heart failure: Secondary | ICD-10-CM | POA: Diagnosis not present

## 2017-05-30 DIAGNOSIS — I420 Dilated cardiomyopathy: Secondary | ICD-10-CM

## 2017-05-30 DIAGNOSIS — I255 Ischemic cardiomyopathy: Secondary | ICD-10-CM

## 2017-05-30 DIAGNOSIS — Z951 Presence of aortocoronary bypass graft: Secondary | ICD-10-CM

## 2017-05-30 LAB — BASIC METABOLIC PANEL
ANION GAP: 8 (ref 5–15)
BUN: 27 mg/dL — ABNORMAL HIGH (ref 6–20)
CO2: 32 mmol/L (ref 22–32)
Calcium: 8.7 mg/dL — ABNORMAL LOW (ref 8.9–10.3)
Chloride: 96 mmol/L — ABNORMAL LOW (ref 101–111)
Creatinine, Ser: 1.44 mg/dL — ABNORMAL HIGH (ref 0.61–1.24)
GFR calc Af Amer: 48 mL/min — ABNORMAL LOW (ref >60)
GFR, EST NON AFRICAN AMERICAN: 42 mL/min — AB (ref >60)
GLUCOSE: 76 mg/dL (ref 65–99)
POTASSIUM: 3.4 mmol/L — AB (ref 3.5–5.1)
Sodium: 136 mmol/L (ref 135–145)

## 2017-05-30 NOTE — Patient Instructions (Signed)
Medication Instructions:  Your physician recommends that you continue on your current medications as directed. Please refer to the Current Medication list given to you today.   Labwork: TODAY BMET  Testing/Procedures: NONE  Follow-Up: Your physician recommends that you schedule a follow-up appointment in: 2-3 WEEKS   Any Other Special Instructions Will Be Listed Below (If Applicable). You have been referred to CARDIAC REHAB, SOMEONE WILL CONTACT YOU FROM THERE TO SCHEDULE YOUR APPOINTMENT.     If you need a refill on your cardiac medications before your next appointment, please call your pharmacy.

## 2017-05-31 ENCOUNTER — Telehealth: Payer: Self-pay

## 2017-05-31 ENCOUNTER — Telehealth: Payer: Self-pay | Admitting: Physician Assistant

## 2017-05-31 DIAGNOSIS — R600 Localized edema: Secondary | ICD-10-CM

## 2017-05-31 MED ORDER — POTASSIUM CHLORIDE ER 20 MEQ PO TBCR: 20.0000 meq | EXTENDED_RELEASE_TABLET | Freq: Every day | ORAL | 3 refills | Status: AC

## 2017-05-31 NOTE — Telephone Encounter (Signed)
Spoke with pt's wife. Explained medication dosage to her. She voiced understanding.

## 2017-05-31 NOTE — Telephone Encounter (Signed)
Needing Rx for Potassium sent in to [facility] there yesterday when they picked up his meds. Also, his wife would like to know if he's supposed to take an extra everyday.

## 2017-06-04 ENCOUNTER — Ambulatory Visit: Payer: Medicare (Managed Care) | Admitting: Physician Assistant

## 2017-06-08 ENCOUNTER — Other Ambulatory Visit: Payer: Self-pay | Admitting: Thoracic Surgery (Cardiothoracic Vascular Surgery)

## 2017-06-08 DIAGNOSIS — Z951 Presence of aortocoronary bypass graft: Secondary | ICD-10-CM

## 2017-06-12 ENCOUNTER — Ambulatory Visit (INDEPENDENT_AMBULATORY_CARE_PROVIDER_SITE_OTHER): Payer: Self-pay | Admitting: Thoracic Surgery (Cardiothoracic Vascular Surgery)

## 2017-06-12 ENCOUNTER — Ambulatory Visit
Admission: RE | Admit: 2017-06-12 | Discharge: 2017-06-12 | Disposition: A | Discharge status: Home / Self Care | Payer: Medicare (Managed Care) | Source: Ambulatory Visit | Attending: Thoracic Surgery (Cardiothoracic Vascular Surgery) | Admitting: Thoracic Surgery (Cardiothoracic Vascular Surgery)

## 2017-06-12 ENCOUNTER — Encounter: Payer: Self-pay | Admitting: Thoracic Surgery (Cardiothoracic Vascular Surgery)

## 2017-06-12 VITALS — BP 126/79 | HR 71 | Resp 20 | Ht 70.0 in | Wt 198.0 lb

## 2017-06-12 DIAGNOSIS — Z951 Presence of aortocoronary bypass graft: Secondary | ICD-10-CM

## 2017-06-12 DIAGNOSIS — I2510 Atherosclerotic heart disease of native coronary artery without angina pectoris: Secondary | ICD-10-CM

## 2017-06-12 NOTE — Progress Notes (Signed)
[address]       [place] [address]             [phone]     HPI: Mr. Lama returns for a scheduled postoperative follow-up visit  Mr. Narula is an 83 year old gentleman from [place] with S medical history of coronary disease with previous MIs, stents, and congestive heart failure. He also has a history of hypertension, hypothyroidism, and obstructive sleep apnea. He was visiting family here [place] when he presented with dyspnea. He ruled in for an MI with a troponin of 37.5. At cardiac catheterization he was found to have severe three-vessel coronary disease. His EF was about 25 by echo preop. It looked a little better and that on the intraoperative TEE.  He underwent coronary bypass grafting 3 on 05/09/2017.  His postoperative course was, treated by atrial fibrillation. He was started on amiodarone and apixaban. He has been having problems with swelling in his legs and shortness of breath. He also complains of sleep disturbance. He is not having much pain and is taking acetaminophen when needed.  Past Medical History:  Diagnosis Date  . Angina pectoris (HCC)   . Chronic combined systolic and diastolic heart failure, NYHA class 2 (HCC) 2014   EF reportedly anywhere from 25-40%  . Coronary artery disease involving native coronary artery    Reportedly multivessel  . Essential hypertension   . Gout   . History of MI (myocardial infarction)   . Hyperlipemia   . Hyperlipidemia   . Hypothyroidism   . Ischemic dilated cardiomyopathy (HCC)   . Multi-vessel coronary artery stenosis   . Varicose veins of both lower extremities     Current Outpatient Prescriptions  Medication Sig Dispense Refill  . acetaminophen (TYLENOL) 500 MG tablet Take 2 tablets (1,000 mg total) by mouth every 6 (six) hours as needed. 100 tablet 2  . allopurinol (ZYLOPRIM) 100 MG tablet Take 1 tablet (100 mg total) by mouth daily. 30 tablet 1  . amiodarone (PACERONE) 200 MG  tablet Take 1 tablet (200 mg total) by mouth daily. 30 tablet 11  . apixaban (ELIQUIS) 5 MG TABS tablet Take 1 tablet (5 mg total) by mouth 2 (two) times daily. 60 tablet 3  . aspirin 81 MG chewable tablet Chew 81 mg by mouth daily.    Marland Kitchen CALCIUM CARBONATE PO Take 600 mg by mouth 2 (two) times daily.    . carvedilol (COREG) 6.25 MG tablet Take 6.25 mg by mouth 2 (two) times daily with a meal.    . Cholecalciferol (VITAMIN D-1000 MAX ST) 1000 units tablet Take 2,000 Units by mouth every morning.    . cyanocobalamin (TH VITAMIN B12) 100 MCG tablet Take 1 tablet by mouth daily. Take 1 tablet by mouth in the evening.    . fluticasone (CUTIVATE) 0.05 % cream Apply topically 2 (two) times daily.    . furosemide (LASIX) 20 MG tablet Take 3 tablets (60 mg total) by mouth daily. 120 tablet 1  . isosorbide mononitrate (IMDUR) 30 MG 24 hr tablet Take 30 mg by mouth daily.    Marland Kitchen levothyroxine (SYNTHROID, LEVOTHROID) 150 MCG tablet Take 150 mcg by mouth daily before breakfast.    . Lutein 20 MG CAPS Take 1 capsule by mouth every morning.    . Multiple Vitamin (MULTIVITAMIN) tablet Take 1 tablet by mouth daily.    . nitroGLYCERIN (NITROSTAT) 0.4 MG SL tablet Place 0.4 mg under the tongue every 5 (five)  minutes as needed for chest pain.    Marland Kitchen Potassium Chloride ER 20 MEQ TBCR Take 20 mEq by mouth daily. 30 tablet 3  . pravastatin (PRAVACHOL) 40 MG tablet Take 40 mg by mouth at bedtime.    . traMADol (ULTRAM) 50 MG tablet Take 1 tablet (50 mg total) by mouth every 6 (six) hours as needed for moderate pain. 30 tablet 0  . Ubiquinol 100 MG CAPS Take 1 capsule by mouth every morning.     No current facility-administered medications for this visit.     Physical Exam BP 126/79   Pulse 71   Resp 20   Ht 5\' 10"  (1.778 m)   Wt 198 lb (89.8 kg)   SpO2 98% Comment: RA  BMI 28.62 kg/m  83 year old man in no acute distress Alert and oriented 3 with no focal deficits Cardiac irregularly irregular Lungs diminished  both bases Sternum stable, incision healing well Leg incisions healing well, 2+ pitting edema both lower extremities  Diagnostic Tests: CHEST  2 VIEW  COMPARISON:  05/24/2017  FINDINGS: The cardio pericardial silhouette is enlarged. Extensive calcification again noted in the wall the left ventricle consistent with prior infarct. Patient is status post CABG. Bibasilar atelectasis with small bilateral pleural effusions again noted, slightly progressed on the left. No overt pulmonary edema. The visualized bony structures of the thorax are intact.  IMPRESSION: 1. Persistent bibasilar atelectasis with small bilateral pleural effusions, slightly progressed on the left.   Electronically Signed   By: Kennith Center M.D.   On: 06/12/2017 10:31 I personally reviewed the chest x-ray and concur with the findings noted above.  Impression: Mr. Dirienzo is an 83 year old gentleman with a history of CAD, MI, or cutaneous interventions, and congestive heart failure. He presented with a non-ST elevation MI with a peak troponin of 37 and was found to have severe three-vessel coronary disease and severe left ventricular dysfunction. He underwent coronary bypass grafting 3 on 05/09/2017.  Congestive heart failure- his ACE inhibitor. Due to renal insufficiency. His most recent creatinine was stable at 1.44. He is still volume overloaded with small bilateral effusions and peripheral edema. He is on 60 of Lasix in the morning and 40 at night. I did not change that dose. I think he would benefit from cardioversion as he is probably somewhat dependent on his atrial kick given his severe left ventricular dysfunction. He sees cardiology tomorrow.  Atrial fibrillation- rate controlled on current regimen and is anticoagulated on apixaban. As noted above think he would benefit from cardioversion.  Sleep disturbance - not uncommon following surgery. He had issues with that prior to surgery the think the  surgery has exacerbated the problem. He could try melatonin or a low dose of Benadryl, but I do not want to prescribe a stronger pain medication for this elderly gentleman.   He had questions about cardiac rehabilitation. I think he probably should wait a couple more weeks before starting that it maximal benefit. I did encourage him to continue walking on his own in the meantime.  Plan: Follow-up with cardiology tomorrow I will see him back in 2 weeks to check on his progress.  Loreli Slot, MD [facility] [phone]

## 2017-06-13 ENCOUNTER — Encounter: Payer: Self-pay | Admitting: Physician Assistant

## 2017-06-13 ENCOUNTER — Other Ambulatory Visit (HOSPITAL_COMMUNITY)
Admission: RE | Admit: 2017-06-13 | Discharge: 2017-06-13 | Disposition: A | Discharge status: Home / Self Care | Payer: Medicare (Managed Care) | Source: Ambulatory Visit | Attending: Physician Assistant | Admitting: Physician Assistant

## 2017-06-13 ENCOUNTER — Ambulatory Visit (INDEPENDENT_AMBULATORY_CARE_PROVIDER_SITE_OTHER): Payer: Medicare (Managed Care) | Admitting: Physician Assistant

## 2017-06-13 VITALS — BP 112/68 | HR 87 | Ht 70.0 in | Wt 198.2 lb

## 2017-06-13 DIAGNOSIS — I350 Nonrheumatic aortic (valve) stenosis: Secondary | ICD-10-CM

## 2017-06-13 DIAGNOSIS — I255 Ischemic cardiomyopathy: Secondary | ICD-10-CM

## 2017-06-13 DIAGNOSIS — I420 Dilated cardiomyopathy: Secondary | ICD-10-CM

## 2017-06-13 DIAGNOSIS — I10 Essential (primary) hypertension: Secondary | ICD-10-CM | POA: Diagnosis not present

## 2017-06-13 DIAGNOSIS — I5023 Acute on chronic systolic (congestive) heart failure: Secondary | ICD-10-CM | POA: Insufficient documentation

## 2017-06-13 DIAGNOSIS — Z951 Presence of aortocoronary bypass graft: Secondary | ICD-10-CM

## 2017-06-13 DIAGNOSIS — I480 Paroxysmal atrial fibrillation: Secondary | ICD-10-CM | POA: Diagnosis not present

## 2017-06-13 LAB — BASIC METABOLIC PANEL
Anion gap: 10 (ref 5–15)
BUN: 19 mg/dL (ref 6–20)
CHLORIDE: 97 mmol/L — AB (ref 101–111)
CO2: 31 mmol/L (ref 22–32)
CREATININE: 1.27 mg/dL — AB (ref 0.61–1.24)
Calcium: 8.8 mg/dL — ABNORMAL LOW (ref 8.9–10.3)
GFR calc Af Amer: 56 mL/min — ABNORMAL LOW (ref >60)
GFR, EST NON AFRICAN AMERICAN: 49 mL/min — AB (ref >60)
GLUCOSE: 133 mg/dL — AB (ref 65–99)
POTASSIUM: 3.7 mmol/L (ref 3.5–5.1)
SODIUM: 138 mmol/L (ref 135–145)

## 2017-06-13 MED ORDER — TORSEMIDE 20 MG PO TABS: 20.0000 mg | ORAL_TABLET | Freq: Two times a day (BID) | ORAL | 3 refills | Status: AC

## 2017-06-13 MED ORDER — APIXABAN 5 MG PO TABS: 5.0000 mg | ORAL_TABLET | Freq: Two times a day (BID) | ORAL | 3 refills | Status: AC

## 2017-06-13 MED ORDER — METOLAZONE 2.5 MG PO TABS: 2.5000 mg | ORAL_TABLET | Freq: Every day | ORAL | 3 refills | Status: DC

## 2017-06-13 NOTE — Patient Instructions (Signed)
Medication Instructions:  Your physician has recommended you make the following change in your medication:  Stop taking Lasix Start Taking Torsemide 20 mg Two Times Daily  Take Metolazone 2.5 mg on Friday only if still Short of Breath    Labwork: Your physician recommends that you return for lab work in: Today    Testing/Procedures: NONE  Follow-Up: Your physician recommends that you schedule a follow-up appointment in: 1 Week    Any Other Special Instructions Will Be Listed Below (If Applicable).  If you are not feeling any better on Friday please be seen in ED.    If you need a refill on your cardiac medications before your next appointment, please call your pharmacy. Thank you for choosing [facility]!

## 2017-06-13 NOTE — Progress Notes (Signed)
Cardiology Office Note    Date:  06/13/2017   ID:  Scott [name], DOB [birth year], MRN [id number]  PCP:  System, Pcp Not In  Cardiologist: Dr. Berline Chough  Chief Complaint  Patient presents with  . Follow-up    History of Present Illness:  Scott [name] is a 83 y.o. male with known CAD. He suffered in MI in 1987 with subsequent PCI with stent placement in 1999 and 2013 or 2014. He also has a history of CHF, OSA, Hypertension, Hypothyroidism. He is here visiting from [place]. 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 He underwent CABG x 3 utilizing LIMA to OM1, Free RIMA to Ramus Intermediate, and Right radial artery to PDA. Postop had bradycardia so Coreg was stopped then developed atrial fibrillation and was placed on amiodarone.   Patient was discharge 05/13/17. [facility] with acute respiratory failure with hypoxia secondary to CHF and question of pneumonia. BNP was over 900. He may gotten extra salt in his diet. His creatinine began to increase and BP was low so ACE inhibitor was held. Ultimate plan for outpatient DC CV once his volume is stabilized. 2-D echo 05/23/17 estimated EF 25% with moderate aortic stenosis. See report below for details.Crt 1.47 at discharge.  I saw the patient in f/u 05/30/17 and he was feeling better. Thought he was in NSR but was still in afib. Crt 1.44 that day down from 1.47.   Patient saw Dr. Dorris Fetch yest and felt to be fluid overloaded with small bilateral effusions and edema. His lasix wasn't changed but he felt like the patient would benefit from cardioversion.  Patient comes in today accompanied by his wife. He is very short of breath. His wife increased his Lasix last Thursday to 60 mg in the morning 40 in the afternoon. He is now having orthopnea. He is very weak and has trouble walking. His weight is up 4 pounds.        Past Medical History:  Diagnosis Date  . Angina pectoris (HCC)   . Chronic combined systolic and diastolic heart failure, NYHA class 2 (HCC) 2014   EF reportedly anywhere from 25-40%  . Coronary artery disease involving native coronary artery    Reportedly multivessel  . Essential hypertension   . Gout   . History of MI (myocardial infarction)   . Hyperlipemia   . Hyperlipidemia   . Hypothyroidism   . Ischemic dilated cardiomyopathy (HCC)   . Multi-vessel coronary artery stenosis   . Varicose veins of both lower extremities     Past Surgical History:  Procedure Laterality Date  . CHOLECYSTECTOMY    . CORONARY ARTERY BYPASS GRAFT N/A 05/09/2017   Procedure: CORONARY ARTERY BYPASS GRAFTING (CABG) times 3 using the right radial artery and bilateral internal mammary arteries.;  Surgeon: Loreli Slot, MD;  Location: [facility] OR;  Service: Open Heart Surgery;  Laterality: N/A;  . LEFT HEART CATH AND CORONARY ANGIOGRAPHY N/A 04/30/2017   Procedure: Left Heart Cath and Coronary Angiography;  Surgeon: Runell Gess, MD;  Location: [facility] INVASIVE CV LAB;  Service: Cardiovascular;  Laterality: N/A;  . RADIAL ARTERY HARVEST Right 05/09/2017   Procedure: RADIAL ARTERY HARVEST;  Surgeon: Loreli Slot, MD;  Location: [facility] OR;  Service: Open Heart Surgery;  Laterality: Right;  . TEE WITHOUT CARDIOVERSION N/A 05/04/2017   Procedure: TRANSESOPHAGEAL ECHOCARDIOGRAM (TEE);  Surgeon: Mayford Knife,  Cornelious Bryant, MD;  Location: [facility] ENDOSCOPY;  Service: Cardiovascular;  Laterality: N/A;  . TEE WITHOUT CARDIOVERSION N/A 05/09/2017   Procedure: TRANSESOPHAGEAL ECHOCARDIOGRAM (TEE);  Surgeon: Loreli Slot, MD;  Location: [facility] OR;  Service: Open Heart Surgery;  Laterality: N/A;  . VASCULAR SURGERY     Vein stripping    Current Medications: Current Meds  Medication Sig  . acetaminophen (TYLENOL) 500 MG tablet Take 2 tablets (1,000 mg total) by mouth every 6 (six) hours as needed.  Marland Kitchen allopurinol  (ZYLOPRIM) 100 MG tablet Take 1 tablet (100 mg total) by mouth daily.  Marland Kitchen amiodarone (PACERONE) 200 MG tablet Take 1 tablet (200 mg total) by mouth daily.  Marland Kitchen apixaban (ELIQUIS) 5 MG TABS tablet Take 1 tablet (5 mg total) by mouth 2 (two) times daily.  Marland Kitchen aspirin 81 MG chewable tablet Chew 81 mg by mouth daily.  Marland Kitchen CALCIUM CARBONATE PO Take 600 mg by mouth 2 (two) times daily.  . carvedilol (COREG) 6.25 MG tablet Take 6.25 mg by mouth 2 (two) times daily with a meal.  . Cholecalciferol (VITAMIN D-1000 MAX ST) 1000 units tablet Take 2,000 Units by mouth every morning.  . cyanocobalamin (TH VITAMIN B12) 100 MCG tablet Take 1 tablet by mouth daily. Take 1 tablet by mouth in the evening.  . fluticasone (CUTIVATE) 0.05 % cream Apply topically 2 (two) times daily.  . furosemide (LASIX) 20 MG tablet Take 3 tablets (60 mg total) by mouth daily.  . isosorbide mononitrate (IMDUR) 30 MG 24 hr tablet Take 30 mg by mouth daily.  Marland Kitchen levothyroxine (SYNTHROID, LEVOTHROID) 150 MCG tablet Take 150 mcg by mouth daily before breakfast.  . Lutein 20 MG CAPS Take 1 capsule by mouth every morning.  . Multiple Vitamin (MULTIVITAMIN) tablet Take 1 tablet by mouth daily.  . nitroGLYCERIN (NITROSTAT) 0.4 MG SL tablet Place 0.4 mg under the tongue every 5 (five) minutes as needed for chest pain.  Marland Kitchen Potassium Chloride ER 20 MEQ TBCR Take 20 mEq by mouth daily.  . pravastatin (PRAVACHOL) 40 MG tablet Take 40 mg by mouth at bedtime.  . traMADol (ULTRAM) 50 MG tablet Take 1 tablet (50 mg total) by mouth every 6 (six) hours as needed for moderate pain.  Marland Kitchen Ubiquinol 100 MG CAPS Take 1 capsule by mouth every morning.     Allergies:   Nifedipine and Rabeprazole sodium   Social History   Social History  . Marital status: Married    Spouse name: N/A  . Number of children: 3  . Years of education: N/A   Social History Main Topics  . Smoking status: Never Smoker  . Smokeless tobacco: Never Used  . Alcohol use Yes     Comment:  Social  . Drug use: No  . Sexual activity: No   Other Topics Concern  . None   Social History Narrative   Remarried, with current wife 1 year. Has 3 children with his former wife. Visiting from [place].     Family History:  The patient's family history includes Bladder Cancer in his brother and father; Heart disease in his mother; Hypertension in his son.   ROS:   Please see the history of present illness.    Review of Systems  Constitution: Positive for weakness, malaise/fatigue and weight gain.  HENT: Negative.   Cardiovascular: Positive for dyspnea on exertion, irregular heartbeat and leg swelling.  Respiratory: Positive for cough, shortness of breath and sleep disturbances due to breathing.   Endocrine: Negative.  Hematologic/Lymphatic: Negative.   Musculoskeletal: Positive for muscle weakness.  Gastrointestinal: Negative.   Genitourinary: Negative.    All other systems reviewed and are negative.   PHYSICAL EXAM:   VS:  BP 112/68   Pulse 87   Ht 5\' 10"  (1.778 m)   Wt 198 lb 3.2 oz (89.9 kg)   SpO2 94%   BMI 28.44 kg/m   Physical Exam  GEN: Well nourished, well developed, in no acute distress  Neck: Increased JVD, no carotid bruits, or masses Cardiac:RRR; 2/6 systolic murmur at the left sternal border Respiratory: Decreased breath sounds with rales at the bases GI: soft, nontender, nondistended, + BS Ext: +2 edema bilaterally Neuro:  Alert and Oriented x 3 Psych: euthymic mood, full affect  Wt Readings from Last 3 Encounters:  06/13/17 198 lb 3.2 oz (89.9 kg)  06/12/17 198 lb (89.8 kg)  05/30/17 194 lb (88 kg)      Studies/Labs Reviewed:   EKG:  EKG is  ordered today.  The ekg ordered today demonstrates Atrial fibrillation with controlled rate  Recent Labs: 04/27/2017: TSH 0.713 05/22/2017: B Natriuretic Peptide 908.0; Hemoglobin 11.6; Platelets 329 05/23/2017: Magnesium 2.1 05/24/2017: ALT 21 05/30/2017: BUN 27; Creatinine, Ser 1.44; Potassium 3.4; Sodium  136   Lipid Panel No results found for: CHOL, TRIG, HDL, CHOLHDL, VLDL, LDLCALC, LDLDIRECT  Additional studies/ records that were reviewed today include:    Echo 05/23/17- Study Conclusions   - Left ventricle: RWMA;s hard to see even with definity Septal and   apical akineis mid and basal inferior wall hypokinesis. The   cavity size was moderately dilated. Wall thickness was normal.   The estimated ejection fraction was 25%. Doppler parameters are   consistent with both elevated ventricular end-diastolic filling   pressure and elevated left atrial filling pressure. - Aortic valve: Given low EF overall AS at least moderate and   morphologically significant calcification and thickening with   restricted leaflet motion. Valve area (VTI): 1 cm^2. Valve area   (Vmax): 1.26 cm^2. Valve area (Vmean): 1.01 cm^2. - Left atrium: The atrium was moderately dilated. - Atrial septum: No defect or patent foramen ovale was identified. - Pulmonary arteries: PA peak pressure: 42 mm Hg (S). - Pericardium, extracardiac: A trivial pericardial effusion was   identified. There was a left pleural effusion.    ASSESSMENT:    1. S/P CABG x 3   2. Systolic CHF, acute on chronic (HCC)   3. Paroxysmal atrial fibrillation (HCC)   4. Ischemic dilated cardiomyopathy (HCC)   5. Essential hypertension   6. Nonrheumatic aortic valve stenosis      PLAN:  In order of problems listed above:  Status post CABG 3 05/09/17 after NSTEMI. Slow to recover with Repeat hospitalization for CHF. Also complicated by atrial fibrillation. Dr. Dorris Fetch saw him yesterday and felt he would benefit from cardioversion. Now has worsening CHF.  Chronic CHF LVEF 25% on Follow-up echo 05/23/17 which is lower than before 30-35%. Not on ACE inhibitor because of renal insufficiency. Now with orthopnea and worsening dyspnea. His wife his increase his Lasix without improvement in his symptoms. Discussed with Dr.Koneswaran. We'll stop  Lasix and start torsemide 20 mg twice a day. Check renal function today. Given a prescription for metolazone 2.5 mg but only take if he has no improvement by Friday. I will see him back next week and if he has improved CHF we will schedule cardioversion. If he continues to declined recommend that he goes to the emergency  room.  PAF on amiodarone and Eliquis- on since discharge. Plan cardioversion once heart failure is better controlled and he is no longer orthopneic  Ischemic dilated cardiomyopathy EF 25% hopefully will recover once he is in normal sinus rhythm  Essential hypertension blood pressure is been on the low side  Moderate aortic stenosis on most recent echo.    Medication Adjustments/Labs and Tests Ordered: Current medicines are reviewed at length with the patient today.  Concerns regarding medicines are outlined above.  Medication changes, Labs and Tests ordered today are listed in the Patient Instructions below. There are no Patient Instructions on file for this visit.   Signed, Jacolyn Reedy, PA-C  06/13/2017 1:39 PM    [facility] [address] [place], [place], [place]  [address] Phone: [phone]; Fax: [phone]

## 2017-06-15 ENCOUNTER — Encounter (HOSPITAL_COMMUNITY): Payer: Self-pay | Admitting: Emergency Medicine

## 2017-06-15 ENCOUNTER — Inpatient Hospital Stay (HOSPITAL_COMMUNITY)
Admission: EM | Admit: 2017-06-15 | Discharge: 2017-06-17 | DRG: 291 | Disposition: A | Discharge status: Home / Self Care | Payer: Medicare (Managed Care) | Attending: Internal Medicine | Admitting: Internal Medicine

## 2017-06-15 ENCOUNTER — Other Ambulatory Visit: Payer: Self-pay

## 2017-06-15 ENCOUNTER — Emergency Department (HOSPITAL_COMMUNITY): Payer: Medicare (Managed Care)

## 2017-06-15 DIAGNOSIS — I255 Ischemic cardiomyopathy: Secondary | ICD-10-CM | POA: Diagnosis present

## 2017-06-15 DIAGNOSIS — I5043 Acute on chronic combined systolic (congestive) and diastolic (congestive) heart failure: Secondary | ICD-10-CM | POA: Diagnosis present

## 2017-06-15 DIAGNOSIS — I481 Persistent atrial fibrillation: Secondary | ICD-10-CM | POA: Diagnosis not present

## 2017-06-15 DIAGNOSIS — I252 Old myocardial infarction: Secondary | ICD-10-CM

## 2017-06-15 DIAGNOSIS — E039 Hypothyroidism, unspecified: Secondary | ICD-10-CM | POA: Diagnosis present

## 2017-06-15 DIAGNOSIS — Z79899 Other long term (current) drug therapy: Secondary | ICD-10-CM | POA: Diagnosis not present

## 2017-06-15 DIAGNOSIS — Z9049 Acquired absence of other specified parts of digestive tract: Secondary | ICD-10-CM | POA: Diagnosis not present

## 2017-06-15 DIAGNOSIS — Z8249 Family history of ischemic heart disease and other diseases of the circulatory system: Secondary | ICD-10-CM | POA: Diagnosis not present

## 2017-06-15 DIAGNOSIS — M109 Gout, unspecified: Secondary | ICD-10-CM | POA: Diagnosis present

## 2017-06-15 DIAGNOSIS — R0902 Hypoxemia: Secondary | ICD-10-CM | POA: Diagnosis present

## 2017-06-15 DIAGNOSIS — I10 Essential (primary) hypertension: Secondary | ICD-10-CM | POA: Diagnosis not present

## 2017-06-15 DIAGNOSIS — Z888 Allergy status to other drugs, medicaments and biological substances status: Secondary | ICD-10-CM

## 2017-06-15 DIAGNOSIS — N183 Chronic kidney disease, stage 3 (moderate): Secondary | ICD-10-CM | POA: Diagnosis present

## 2017-06-15 DIAGNOSIS — Z951 Presence of aortocoronary bypass graft: Secondary | ICD-10-CM | POA: Diagnosis not present

## 2017-06-15 DIAGNOSIS — Z7982 Long term (current) use of aspirin: Secondary | ICD-10-CM

## 2017-06-15 DIAGNOSIS — I4891 Unspecified atrial fibrillation: Secondary | ICD-10-CM | POA: Diagnosis present

## 2017-06-15 DIAGNOSIS — I2510 Atherosclerotic heart disease of native coronary artery without angina pectoris: Secondary | ICD-10-CM | POA: Diagnosis present

## 2017-06-15 DIAGNOSIS — I420 Dilated cardiomyopathy: Secondary | ICD-10-CM | POA: Diagnosis present

## 2017-06-15 DIAGNOSIS — Z8052 Family history of malignant neoplasm of bladder: Secondary | ICD-10-CM | POA: Diagnosis not present

## 2017-06-15 DIAGNOSIS — D649 Anemia, unspecified: Secondary | ICD-10-CM | POA: Diagnosis present

## 2017-06-15 DIAGNOSIS — Z7901 Long term (current) use of anticoagulants: Secondary | ICD-10-CM | POA: Diagnosis not present

## 2017-06-15 DIAGNOSIS — E876 Hypokalemia: Secondary | ICD-10-CM | POA: Diagnosis present

## 2017-06-15 DIAGNOSIS — I130 Hypertensive heart and chronic kidney disease with heart failure and stage 1 through stage 4 chronic kidney disease, or unspecified chronic kidney disease: Secondary | ICD-10-CM | POA: Diagnosis present

## 2017-06-15 DIAGNOSIS — I509 Heart failure, unspecified: Secondary | ICD-10-CM

## 2017-06-15 DIAGNOSIS — I5023 Acute on chronic systolic (congestive) heart failure: Secondary | ICD-10-CM | POA: Diagnosis present

## 2017-06-15 DIAGNOSIS — I4510 Unspecified right bundle-branch block: Secondary | ICD-10-CM | POA: Diagnosis present

## 2017-06-15 LAB — BASIC METABOLIC PANEL WITH GFR
Anion gap: 10 (ref 5–15)
BUN: 23 mg/dL — ABNORMAL HIGH (ref 6–20)
CO2: 31 mmol/L (ref 22–32)
Calcium: 8.7 mg/dL — ABNORMAL LOW (ref 8.9–10.3)
Chloride: 96 mmol/L — ABNORMAL LOW (ref 101–111)
Creatinine, Ser: 1.53 mg/dL — ABNORMAL HIGH (ref 0.61–1.24)
GFR calc Af Amer: 45 mL/min — ABNORMAL LOW
GFR calc non Af Amer: 39 mL/min — ABNORMAL LOW
Glucose, Bld: 127 mg/dL — ABNORMAL HIGH (ref 65–99)
Potassium: 3.3 mmol/L — ABNORMAL LOW (ref 3.5–5.1)
Sodium: 137 mmol/L (ref 135–145)

## 2017-06-15 LAB — CBC WITH DIFFERENTIAL/PLATELET
Basophils Absolute: 0 K/uL (ref 0.0–0.1)
Basophils Relative: 0 %
Eosinophils Absolute: 0.2 K/uL (ref 0.0–0.7)
Eosinophils Relative: 3 %
HCT: 38.2 % — ABNORMAL LOW (ref 39.0–52.0)
Hemoglobin: 12.2 g/dL — ABNORMAL LOW (ref 13.0–17.0)
Lymphocytes Relative: 17 %
Lymphs Abs: 1.1 K/uL (ref 0.7–4.0)
MCH: 29.3 pg (ref 26.0–34.0)
MCHC: 31.9 g/dL (ref 30.0–36.0)
MCV: 91.6 fL (ref 78.0–100.0)
Monocytes Absolute: 0.7 K/uL (ref 0.1–1.0)
Monocytes Relative: 11 %
Neutro Abs: 4.6 K/uL (ref 1.7–7.7)
Neutrophils Relative %: 69 %
Platelets: 211 K/uL (ref 150–400)
RBC: 4.17 MIL/uL — ABNORMAL LOW (ref 4.22–5.81)
RDW: 15.1 % (ref 11.5–15.5)
WBC: 6.6 K/uL (ref 4.0–10.5)

## 2017-06-15 LAB — BRAIN NATRIURETIC PEPTIDE: B Natriuretic Peptide: 976 pg/mL — ABNORMAL HIGH (ref 0.0–100.0)

## 2017-06-15 LAB — TROPONIN I: Troponin I: 0.03 ng/mL

## 2017-06-15 MED ORDER — FUROSEMIDE 10 MG/ML IJ SOLN
40.0000 mg | Freq: Two times a day (BID) | INTRAMUSCULAR | Status: DC
  Administered 2017-06-16 – 2017-06-17 (×3): 40 mg via INTRAVENOUS
  Filled 2017-06-15 (×3): qty 4

## 2017-06-15 MED ORDER — TRAMADOL HCL 50 MG PO TABS: 50.0000 mg | ORAL_TABLET | Freq: Four times a day (QID) | ORAL | Status: DC | PRN

## 2017-06-15 MED ORDER — ACETAMINOPHEN 325 MG PO TABS: 650.0000 mg | ORAL_TABLET | ORAL | Status: DC | PRN

## 2017-06-15 MED ORDER — POTASSIUM CHLORIDE CRYS ER 20 MEQ PO TBCR
20.0000 meq | EXTENDED_RELEASE_TABLET | Freq: Two times a day (BID) | ORAL | Status: DC
  Administered 2017-06-15 – 2017-06-17 (×4): 20 meq via ORAL
  Filled 2017-06-15 (×4): qty 1

## 2017-06-15 MED ORDER — PRAVASTATIN SODIUM 40 MG PO TABS
40.0000 mg | ORAL_TABLET | Freq: Every day | ORAL | Status: DC
  Administered 2017-06-15 – 2017-06-16 (×2): 40 mg via ORAL
  Filled 2017-06-15 (×2): qty 1

## 2017-06-15 MED ORDER — VITAMIN B-12 100 MCG PO TABS
100.0000 ug | ORAL_TABLET | Freq: Every day | ORAL | Status: DC
  Administered 2017-06-15 – 2017-06-16 (×2): 100 ug via ORAL
  Filled 2017-06-15 (×2): qty 1

## 2017-06-15 MED ORDER — ASPIRIN 81 MG PO CHEW
81.0000 mg | CHEWABLE_TABLET | Freq: Every day | ORAL | Status: DC
  Administered 2017-06-16 – 2017-06-17 (×2): 81 mg via ORAL
  Filled 2017-06-15 (×2): qty 1

## 2017-06-15 MED ORDER — ALLOPURINOL 100 MG PO TABS
100.0000 mg | ORAL_TABLET | Freq: Every day | ORAL | Status: DC
  Administered 2017-06-16 – 2017-06-17 (×2): 100 mg via ORAL
  Filled 2017-06-15 (×2): qty 1

## 2017-06-15 MED ORDER — VITAMIN D 1000 UNITS PO TABS
2000.0000 [IU] | ORAL_TABLET | Freq: Every morning | ORAL | Status: DC
  Administered 2017-06-16 – 2017-06-17 (×2): 2000 [IU] via ORAL
  Filled 2017-06-15 (×2): qty 2

## 2017-06-15 MED ORDER — SODIUM CHLORIDE 0.9% FLUSH: 3.0000 mL | INTRAVENOUS | Status: DC | PRN

## 2017-06-15 MED ORDER — SODIUM CHLORIDE 0.9% FLUSH
3.0000 mL | Freq: Two times a day (BID) | INTRAVENOUS | Status: DC
  Administered 2017-06-15 – 2017-06-17 (×4): 3 mL via INTRAVENOUS

## 2017-06-15 MED ORDER — ADULT MULTIVITAMIN W/MINERALS CH
1.0000 | ORAL_TABLET | Freq: Every day | ORAL | Status: DC
  Administered 2017-06-16 – 2017-06-17 (×2): 1 via ORAL
  Filled 2017-06-15 (×2): qty 1

## 2017-06-15 MED ORDER — LEVOTHYROXINE SODIUM 75 MCG PO TABS
150.0000 ug | ORAL_TABLET | ORAL | Status: DC
  Administered 2017-06-16 – 2017-06-17 (×2): 150 ug via ORAL
  Filled 2017-06-15 (×2): qty 2

## 2017-06-15 MED ORDER — APIXABAN 5 MG PO TABS
5.0000 mg | ORAL_TABLET | Freq: Two times a day (BID) | ORAL | Status: DC
  Administered 2017-06-15 – 2017-06-17 (×4): 5 mg via ORAL
  Filled 2017-06-15 (×4): qty 1

## 2017-06-15 MED ORDER — FUROSEMIDE 10 MG/ML IJ SOLN
40.0000 mg | Freq: Once | INTRAMUSCULAR | Status: AC
  Administered 2017-06-15: 40 mg via INTRAVENOUS
  Filled 2017-06-15: qty 4

## 2017-06-15 MED ORDER — SODIUM CHLORIDE 0.9 % IV SOLN: 250.0000 mL | INTRAVENOUS | Status: DC | PRN

## 2017-06-15 MED ORDER — AMIODARONE HCL 200 MG PO TABS
200.0000 mg | ORAL_TABLET | Freq: Every day | ORAL | Status: DC
  Administered 2017-06-16 – 2017-06-17 (×2): 200 mg via ORAL
  Filled 2017-06-15 (×2): qty 1

## 2017-06-15 MED ORDER — ONDANSETRON HCL 4 MG/2ML IJ SOLN: 4.0000 mg | Freq: Four times a day (QID) | INTRAMUSCULAR | Status: DC | PRN

## 2017-06-15 MED ORDER — NITROGLYCERIN 0.4 MG SL SUBL: 0.4000 mg | SUBLINGUAL_TABLET | SUBLINGUAL | Status: DC | PRN

## 2017-06-15 MED ORDER — ISOSORBIDE MONONITRATE ER 30 MG PO TB24
30.0000 mg | ORAL_TABLET | Freq: Every day | ORAL | Status: DC
  Administered 2017-06-16 – 2017-06-17 (×2): 30 mg via ORAL
  Filled 2017-06-15 (×2): qty 1

## 2017-06-15 MED ORDER — CARVEDILOL 3.125 MG PO TABS
6.2500 mg | ORAL_TABLET | Freq: Two times a day (BID) | ORAL | Status: DC
  Administered 2017-06-16 – 2017-06-17 (×3): 6.25 mg via ORAL
  Filled 2017-06-15 (×3): qty 2

## 2017-06-15 MED ORDER — CARVEDILOL 3.125 MG PO TABS
6.2500 mg | ORAL_TABLET | Freq: Once | ORAL | Status: AC
  Administered 2017-06-15: 6.25 mg via ORAL
  Filled 2017-06-15: qty 2

## 2017-06-15 MED ORDER — PRAVASTATIN SODIUM 40 MG PO TABS: 40.0000 mg | ORAL_TABLET | Freq: Every day | ORAL | Status: DC

## 2017-06-15 NOTE — Progress Notes (Signed)
Pt has home CPAP unit plugged into red outlet. No sign of damage to machine. CPAP is plugged in and ready for use with distilled water added. Pt's wife is at bedside. Pt states that he will place CPAP at bedtime but will call should he need assistance

## 2017-06-15 NOTE — ED Notes (Signed)
Pt O2 saturation on room air decreased to 85% when standing at side of bed to use urinal. Increased SOB as well. Pt O2 saturation 95% upon returning to stretcher.

## 2017-06-15 NOTE — ED Triage Notes (Signed)
Patient complains of shortness of breath. Was given diarrhetic by cardiologist yesterday, but states not putting out any fluid today. States feels like fluid on lungs. States open heart/triple bypass in August.

## 2017-06-15 NOTE — ED Notes (Signed)
Report given to [facility], pt going to 325

## 2017-06-15 NOTE — ED Provider Notes (Signed)
[facility]-EMER[facility]Y DEPT Provider Note   CSN: [id number] Arrival date & time: 06/15/17  1216     History   Chief Complaint Chief Complaint  Patient presents with  . Shortness of Breath    HPI Scott [name] is a 83 y.o. male.  HPI  Pt was seen at 1250. Per pt and his family, c/o gradual onset and worsening of persistent SOB and increasing pedal edema for the past 1 week, worse over the past 2 days. Pt's SOB worsens with laying flat and exertion. Pt states he has gained 4# in the past week. Pt states he was evaluated by his Cards MD 2 days ago for this complaint, and had his diuretic dose increased. Pt's family states pt "hasn't lost any weight" despite taking this increased dose. Denies CP/palpitations, no cough, no back pain, no abd pain, no N/V/D, no fevers.   Past Medical History:  Diagnosis Date  . Angina pectoris (HCC)   . Chronic combined systolic and diastolic heart failure, NYHA class 2 (HCC) 2014   EF reportedly anywhere from 25-40%  . Coronary artery disease involving native coronary artery    Reportedly multivessel  . Essential hypertension   . Gout   . History of MI (myocardial infarction)   . Hyperlipemia   . Hyperlipidemia   . Hypothyroidism   . Ischemic dilated cardiomyopathy (HCC)   . Multi-vessel coronary artery stenosis   . Varicose veins of both lower extremities     Patient Active Problem List   Diagnosis Date Noted  . Acute pulmonary edema (HCC)   . Atrial fibrillation (HCC)   . Constipation   . Systolic CHF, acute on chronic (HCC) 05/22/2017  . 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 Coronary artery disease 05/09/2017  . Coronary artery disease involving native coronary artery of native heart with unstable angina pectoris (HCC)   . Acute on chronic combined systolic and diastolic CHF (congestive heart failure) (HCC) 04/30/2017  . Nonrheumatic aortic valve stenosis 04/30/2017  . Acute respiratory failure with hypoxia (HCC) 04/27/2017  . Atrial flutter by  electrocardiogram (HCC) 04/27/2017  . Hyperglycemia 04/27/2017  . NSTEMI (non-ST elevated myocardial infarction) (HCC) 04/27/2017  . Multi-vessel coronary artery stenosis   . Ischemic dilated cardiomyopathy (HCC)   . Hypothyroidism   . Hyperlipidemia with target low density lipoprotein (LDL) cholesterol less than 70 mg/dL   . History of MI (myocardial infarction)   . Gout   . Essential hypertension     Past Surgical History:  Procedure Laterality Date  . CHOLECYSTECTOMY    . CORONARY ARTERY BYPASS GRAFT N/A 05/09/2017   Procedure: CORONARY ARTERY BYPASS GRAFTING (CABG) times 3 using the right radial artery and bilateral internal mammary arteries.;  Surgeon: Loreli Slot, MD;  Location: [facility] OR;  Service: Open Heart Surgery;  Laterality: N/A;  . LEFT HEART CATH AND CORONARY ANGIOGRAPHY N/A 04/30/2017   Procedure: Left Heart Cath and Coronary Angiography;  Surgeon: Runell Gess, MD;  Location: [facility] INVASIVE CV LAB;  Service: Cardiovascular;  Laterality: N/A;  . RADIAL ARTERY HARVEST Right 05/09/2017   Procedure: RADIAL ARTERY HARVEST;  Surgeon: Loreli Slot, MD;  Location: [facility] OR;  Service: Open Heart Surgery;  Laterality: Right;  . TEE WITHOUT CARDIOVERSION N/A 05/04/2017   Procedure: TRANSESOPHAGEAL ECHOCARDIOGRAM (TEE);  Surgeon: Quintella Reichert, MD;  Location: [facility] ENDOSCOPY;  Service: Cardiovascular;  Laterality: N/A;  . TEE WITHOUT CARDIOVERSION N/A 05/09/2017   Procedure: TRANSESOPHAGEAL ECHOCARDIOGRAM (TEE);  Surgeon: Loreli Slot, MD;  Location:  [facility] OR;  Service: Open Heart Surgery;  Laterality: N/A;  . VASCULAR SURGERY     Vein stripping       Home Medications    Prior to Admission medications   Medication Sig Start Date End Date Taking? Authorizing Provider  acetaminophen (TYLENOL) 500 MG tablet Take 2 tablets (1,000 mg total) by mouth every 6 (six) hours as needed. 05/15/17 05/15/18 Yes Barrett, Erin R, PA-C  allopurinol (ZYLOPRIM) 100 MG tablet Take 1  tablet (100 mg total) by mouth daily. 05/24/17  Yes Richarda Overlie, MD  amiodarone (PACERONE) 200 MG tablet Take 1 tablet (200 mg total) by mouth daily. 05/30/17  Yes Richarda Overlie, MD  apixaban (ELIQUIS) 5 MG TABS tablet Take 1 tablet (5 mg total) by mouth 2 (two) times daily. 06/13/17  Yes Dyann Kief, PA-C  aspirin 81 MG chewable tablet Chew 81 mg by mouth daily.   Yes [provider]  CALCIUM CARBONATE PO Take 600 mg by mouth 2 (two) times daily. 06/18/09  Yes [provider]  carvedilol (COREG) 6.25 MG tablet Take 6.25 mg by mouth 2 (two) times daily with a meal.   Yes [provider]  Cholecalciferol (VITAMIN D-1000 MAX ST) 1000 units tablet Take 2,000 Units by mouth every morning. 06/16/11  Yes [provider]  cyanocobalamin (TH VITAMIN B12) 100 MCG tablet Take 1 tablet by mouth daily. Take 1 tablet by mouth in the evening.   Yes [provider]  isosorbide mononitrate (IMDUR) 30 MG 24 hr tablet Take 30 mg by mouth daily.   Yes [provider]  levothyroxine (SYNTHROID, LEVOTHROID) 150 MCG tablet Take 150 mcg by mouth daily before breakfast.   Yes [provider]  metolazone (ZAROXOLYN) 2.5 MG tablet Take 1 tablet (2.5 mg total) by mouth daily. 06/13/17 09/11/17 Yes Dyann Kief, PA-C  Multiple Vitamin (MULTIVITAMIN) tablet Take 1 tablet by mouth daily.   Yes [provider]  nitroGLYCERIN (NITROSTAT) 0.4 MG SL tablet Place 0.4 mg under the tongue every 5 (five) minutes as needed for chest pain.   Yes [provider]  Potassium Chloride ER 20 MEQ TBCR Take 20 mEq by mouth daily. 05/31/17  Yes Dyann Kief, PA-C  pravastatin (PRAVACHOL) 40 MG tablet Take 40 mg by mouth at bedtime.   Yes [provider]  torsemide (DEMADEX) 20 MG tablet Take 1 tablet (20 mg total) by mouth 2 (two) times daily. 06/13/17 09/11/17 Yes Dyann Kief, PA-C  fluticasone (CUTIVATE) 0.05 % cream Apply topically 2 (two) times  daily.    [provider]  Lutein 20 MG CAPS Take 1 capsule by mouth every morning.    [provider]  traMADol (ULTRAM) 50 MG tablet Take 1 tablet (50 mg total) by mouth every 6 (six) hours as needed for moderate pain. Patient not taking: Reported on 06/15/2017 05/15/17   Barrett, Erin R, PA-C  Ubiquinol 100 MG CAPS Take 1 capsule by mouth every morning.    [provider]    Family History Family History  Problem Relation Age of Onset  . Heart disease Mother        Pacemaker  . Bladder Cancer Father   . Bladder Cancer Brother   . Hypertension Son     Social History Social History  Substance Use Topics  . Smoking status: Never Smoker  . Smokeless tobacco: Never Used  . Alcohol use Yes     Comment: Social     Allergies  Nifedipine and Rabeprazole sodium   Review of Systems Review of Systems ROS: Statement: All systems negative except as marked or noted in the HPI; Constitutional: Negative for fever and chills. +weight gain.; ; Eyes: Negative for eye pain, redness and discharge. ; ; ENMT: Negative for ear pain, hoarseness, nasal congestion, sinus pressure and sore throat. ; ; Cardiovascular: Negative for chest pain, palpitations, diaphoresis, +dyspnea and peripheral edema. ; ; Respiratory: Negative for cough, wheezing and stridor. ; ; Gastrointestinal: Negative for nausea, vomiting, diarrhea, abdominal pain, blood in stool, hematemesis, jaundice and rectal bleeding. . ; ; Genitourinary: Negative for dysuria, flank pain and hematuria. ; ; Musculoskeletal: Negative for back pain and neck pain. Negative for swelling and trauma.; ; Skin: Negative for pruritus, rash, abrasions, blisters, bruising and skin lesion.; ; Neuro: Negative for headache, lightheadedness and neck stiffness. Negative for weakness, altered level of consciousness, altered mental status, extremity weakness, paresthesias, involuntary movement, seizure and syncope.       Physical  Exam Updated Vital Signs BP 116/67   Pulse 88   Temp 97.8 F (36.6 C) (Oral)   Resp 15   Ht  (1.778 m)   Wt 88.9 kg (196 lb 1 oz)   SpO2 92%   BMI 28.13 kg/m   Physical Exam 1255: Physical examination:  Nursing notes reviewed; Vital signs and O2 SAT reviewed;  Constitutional: Well developed, Well nourished, Well hydrated, In no acute distress; Head:  Normocephalic, atraumatic; Eyes: EOMI, PERRL, No scleral icterus; ENMT: Mouth and pharynx normal, Mucous membranes moist; Neck: Supple, Full range of motion, No lymphadenopathy; Cardiovascular: Irregular rate and rhythm, No gallop; Respiratory: Breath sounds coarse & equal bilaterally, No wheezes.  Speaking full sentences with ease, Normal respiratory effort/excursion; Chest: Nontender, Movement normal; Abdomen: Soft, Nontender, Nondistended, Normal bowel sounds; Genitourinary: No CVA tenderness; Extremities: Pulses normal, No tenderness, +1 pedal edema bilat, No calf asymmetry.; Neuro: AA&Ox3, Major CN grossly intact.  Speech clear. No gross focal motor or sensory deficits in extremities.; Skin: Color normal, Warm, Dry.   ED Treatments / Results  Labs (all labs ordered are listed, but only abnormal results are displayed)   EKG  EKG Interpretation  Date/Time:  Friday June 15 2017 12:42:34 EDT Ventricular Rate:  75 PR Interval:    QRS Duration: 164 QT Interval:  465 QTC Calculation: 520 R Axis:   -79 Text Interpretation:  Atrial fibrillation Left axis deviation IVCD, consider atypical RBBB Left anterior fasicular block Anteroseptal infarct, age indeterminate Lateral leads are also involved When compared with ECG of 05/22/2017 No significant change was found Confirmed by Samuel Jester [phone]) on 06/15/2017 12:59:30 PM       Radiology   Procedures Procedures (including critical care time)  Medications Ordered in ED Medications - No data to display   Initial Impression / Assessment and Plan / ED Course  I have  reviewed the triage vital signs and the nursing notes.  Pertinent labs & imaging results that were available during my care of the patient were reviewed by me and considered in my medical decision making (see chart for details).  MDM Reviewed: previous chart, nursing note and vitals Reviewed previous: labs and ECG Interpretation: labs, ECG and x-ray   Results for orders placed or performed during the hospital encounter of 06/15/17  Basic metabolic panel  Result Value Ref Range   Sodium 137 135 - 145 mmol/L   Potassium 3.3 (L) 3.5 - 5.1 mmol/L   Chloride 96 (L) 101 - 111 mmol/L  CO2 31 22 - 32 mmol/L   Glucose, Bld 127 (H) 65 - 99 mg/dL   BUN 23 (H) 6 - 20 mg/dL   Creatinine, Ser 1.61 (H) 0.61 - 1.24 mg/dL   Calcium 8.7 (L) 8.9 - 10.3 mg/dL   GFR calc non Af Amer 39 (L) >60 mL/min   GFR calc Af Amer 45 (L) >60 mL/min   Anion gap 10 5 - 15  CBC with Differential  Result Value Ref Range   WBC 6.6 4.0 - 10.5 K/uL   RBC 4.17 (L) 4.22 - 5.81 MIL/uL   Hemoglobin 12.2 (L) 13.0 - 17.0 g/dL   HCT 09.6 (L) 04.5 - 40.9 %   MCV 91.6 78.0 - 100.0 fL   MCH 29.3 26.0 - 34.0 pg   MCHC 31.9 30.0 - 36.0 g/dL   RDW 81.1 91.4 - 78.2 %   Platelets 211 150 - 400 K/uL   Neutrophils Relative % 69 %   Neutro Abs 4.6 1.7 - 7.7 K/uL   Lymphocytes Relative 17 %   Lymphs Abs 1.1 0.7 - 4.0 K/uL   Monocytes Relative 11 %   Monocytes Absolute 0.7 0.1 - 1.0 K/uL   Eosinophils Relative 3 %   Eosinophils Absolute 0.2 0.0 - 0.7 K/uL   Basophils Relative 0 %   Basophils Absolute 0.0 0.0 - 0.1 K/uL  Brain natriuretic peptide  Result Value Ref Range   B Natriuretic Peptide 976.0 (H) 0.0 - 100.0 pg/mL  Troponin I  Result Value Ref Range   Troponin I 0.03 (HH) <0.03 ng/mL   Dg Chest 2 View Result Date: 06/15/2017 CLINICAL DATA:  Shortness of breath. EXAM: CHEST  2 VIEW COMPARISON:  06/12/2017 FINDINGS: Sequelae of prior CABG are again identified. The cardiac silhouette remains enlarged. Aortic  atherosclerosis is noted. Bibasilar opacities are again seen. Right basilar aeration has improved, however there is persistent patchy airspace and mild interstitial opacity in the left lung base. Small bilateral pleural effusions persist, likely slightly decreased in size on the right. No pneumothorax is identified. No acute osseous abnormality is seen. IMPRESSION: 1. Improved aeration of the right lung base. 2. Persistent left and residual right basilar opacities may reflect atelectasis, asymmetric edema, or infection. 3. Small pleural effusions, slightly decreased in size on the right. Electronically Signed   By: Sebastian Ache M.D.   On: 06/15/2017 14:14   Results for Scott [name], Scott [name] (MRN [id number]) as of 06/15/2017 15:28  Ref. Range 05/24/2017 08:00 05/30/2017 13:56 06/13/2017 14:09 06/15/2017 12:37  BUN Latest Ref Range: 6 - 20 mg/dL 30 (H) 27 (H) 19 23 (H)  Creatinine Latest Ref Range: 0.61 - 1.24 mg/dL 5.78 (H) 4.69 (H) 6.29 (H) 1.53 (H)   Results for Scott [name], Scott [name] (MRN [id number]) as of 06/15/2017 15:28  Ref. Range 04/27/2017 04:44 05/22/2017 20:39 05/22/2017 23:58 06/15/2017 12:33  B Natriuretic Peptide Latest Ref Range: 0.0 - 100.0 pg/mL 608.0 (H) 884.0 (H) 908.0 (H) 976.0 (H)     1540:   BNP elevated from previous; IV lasix given. Pt stood at the side of the bed to use the urinal and O2 Sats dropped to 85% R/A, pt c/o SOB. Pt sat back on stretcher and rested with O2 Sat increasing to 95% R/A. Dx and testing d/w pt and family.  Questions answered.  Verb understanding, agreeable to admit.  T/C to [facility] Dr. Antionette Char, case discussed, including:  HPI, pertinent PM/SHx, VS/PE, dx testing, ED course and treatment:  Agreeable to admit.  Final Clinical Impressions(s) / ED Diagnoses   Final diagnoses:  None    New Prescriptions New Prescriptions   No medications on file     Samuel Jester, DO 06/18/17 1550

## 2017-06-15 NOTE — ED Notes (Signed)
Call from lab  Critical trop    0.03

## 2017-06-15 NOTE — Progress Notes (Signed)
Attempted to call nurse back in ED to obtain report. No answer.

## 2017-06-15 NOTE — ED Notes (Signed)
Attempted to call report

## 2017-06-15 NOTE — H&P (Signed)
History and Physical    Scott [name] YME:[id number] DOB: 1927-09-24 DOA: 06/15/2017  PCP: System, Pcp Not In   Patient coming from: Home  Chief Complaint: SOB  HPI: Scott [name] is a 83 y.o. male with medical history significant for coronary artery disease status post CABG last month, atrial fibrillation, chronic systolic CHF, hypertension, and hyperlipidemia, presented to the emergency department for evaluation of dyspnea. Patient has been suffering from exertional dyspnea since the recent CABG, and despite increasing his Lasix for the past week, he has gained 4 pounds. He saw his cardiologist in follow-up 2 days ago, diuretics were adjusted with tentative plan for cardioversion once his volume status is stabilized. He denies any chest pain or nausea. Reports some orthopnea. No fevers or chills.  ED Course: Upon arrival to the ED, patient is found to be afebrile, saturating adequately on room air, and with vitals otherwise stable. Patient was noted to desaturate into the 80s while standing at the bedside to urinate. EKG reveals atrial fibrillation with RBBB. Chest x-ray is notable for improved aeration at the right base and persistent left and residual right opacities, likely representing asymmetric edema. Small pleural effusions are noted. Chemistry panel reveals a potassium of 3.3, BUN 23, and creatinine 1.53, up from 0.6 in August, not slightly from 1.27 two days earlier. CBC is notable for stable mild normocytic anemia with hemoglobin of 12.2. Troponin is at the borderline of elevation at 0.03, and BNP is elevated to 976. Patient was treated with Lasix 40 mg IV in the ED. He remained hemodynamically stable and has not been in respiratory distress while at rest. He will be admitted to the telemetry unit for ongoing evaluation and management of shortness of breath and weight gain, secondary to acute on chronic systolic CHF.  Review of Systems:  All other systems reviewed and apart from HPI,  are negative.  Past Medical History:  Diagnosis Date  . Angina pectoris (HCC)   . Chronic combined systolic and diastolic heart failure, NYHA class 2 (HCC) 2014   EF reportedly anywhere from 25-40%  . Coronary artery disease involving native coronary artery    Reportedly multivessel  . Essential hypertension   . Gout   . History of MI (myocardial infarction)   . Hyperlipemia   . Hyperlipidemia   . Hypothyroidism   . Ischemic dilated cardiomyopathy (HCC)   . Multi-vessel coronary artery stenosis   . Varicose veins of both lower extremities     Past Surgical History:  Procedure Laterality Date  . CHOLECYSTECTOMY    . CORONARY ARTERY BYPASS GRAFT N/A 05/09/2017   Procedure: CORONARY ARTERY BYPASS GRAFTING (CABG) times 3 using the right radial artery and bilateral internal mammary arteries.;  Surgeon: Loreli Slot, MD;  Location: [facility] OR;  Service: Open Heart Surgery;  Laterality: N/A;  . LEFT HEART CATH AND CORONARY ANGIOGRAPHY N/A 04/30/2017   Procedure: Left Heart Cath and Coronary Angiography;  Surgeon: Runell Gess, MD;  Location: [facility] INVASIVE CV LAB;  Service: Cardiovascular;  Laterality: N/A;  . RADIAL ARTERY HARVEST Right 05/09/2017   Procedure: RADIAL ARTERY HARVEST;  Surgeon: Loreli Slot, MD;  Location: [facility] OR;  Service: Open Heart Surgery;  Laterality: Right;  . TEE WITHOUT CARDIOVERSION N/A 05/04/2017   Procedure: TRANSESOPHAGEAL ECHOCARDIOGRAM (TEE);  Surgeon: Quintella Reichert, MD;  Location: [facility] ENDOSCOPY;  Service: Cardiovascular;  Laterality: N/A;  . TEE WITHOUT CARDIOVERSION N/A 05/09/2017   Procedure: TRANSESOPHAGEAL ECHOCARDIOGRAM (TEE);  Surgeon: Loreli Slot, MD;  Location:  [facility] OR;  Service: Open Heart Surgery;  Laterality: N/A;  . VASCULAR SURGERY     Vein stripping     reports that he has never smoked. He has never used smokeless tobacco. He reports that he drinks alcohol. He reports that he does not use drugs.  Allergies  Allergen  Reactions  . Nifedipine     UNSPECIFIED REACTION   . Rabeprazole Sodium Nausea Only    Family History  Problem Relation Age of Onset  . Heart disease Mother        Pacemaker  . Bladder Cancer Father   . Bladder Cancer Brother   . Hypertension Son      Prior to Admission medications   Medication Sig Start Date End Date Taking? Authorizing Provider  acetaminophen (TYLENOL) 500 MG tablet Take 2 tablets (1,000 mg total) by mouth every 6 (six) hours as needed. 05/15/17 05/15/18 Yes Barrett, Erin R, PA-C  allopurinol (ZYLOPRIM) 100 MG tablet Take 1 tablet (100 mg total) by mouth daily. 05/24/17  Yes Richarda Overlie, MD  amiodarone (PACERONE) 200 MG tablet Take 1 tablet (200 mg total) by mouth daily. 05/30/17  Yes Richarda Overlie, MD  apixaban (ELIQUIS) 5 MG TABS tablet Take 1 tablet (5 mg total) by mouth 2 (two) times daily. 06/13/17  Yes Dyann Kief, PA-C  aspirin 81 MG chewable tablet Chew 81 mg by mouth daily.   Yes [provider]  CALCIUM CARBONATE PO Take 600 mg by mouth 2 (two) times daily. 06/18/09  Yes [provider]  carvedilol (COREG) 6.25 MG tablet Take 6.25 mg by mouth 2 (two) times daily with a meal.   Yes [provider]  Cholecalciferol (VITAMIN D-1000 MAX ST) 1000 units tablet Take 2,000 Units by mouth every morning. 06/16/11  Yes [provider]  cyanocobalamin (TH VITAMIN B12) 100 MCG tablet Take 1 tablet by mouth daily. Take 1 tablet by mouth in the evening.   Yes [provider]  isosorbide mononitrate (IMDUR) 30 MG 24 hr tablet Take 30 mg by mouth daily.   Yes [provider]  levothyroxine (SYNTHROID, LEVOTHROID) 150 MCG tablet Take 150 mcg by mouth daily before breakfast.   Yes [provider]  metolazone (ZAROXOLYN) 2.5 MG tablet Take 1 tablet (2.5 mg total) by mouth daily. 06/13/17 09/11/17 Yes Dyann Kief, PA-C  Multiple Vitamin (MULTIVITAMIN) tablet Take 1 tablet by mouth daily.   Yes [provider]    nitroGLYCERIN (NITROSTAT) 0.4 MG SL tablet Place 0.4 mg under the tongue every 5 (five) minutes as needed for chest pain.   Yes [provider]  Potassium Chloride ER 20 MEQ TBCR Take 20 mEq by mouth daily. 05/31/17  Yes Dyann Kief, PA-C  pravastatin (PRAVACHOL) 40 MG tablet Take 40 mg by mouth at bedtime.   Yes [provider]  torsemide (DEMADEX) 20 MG tablet Take 1 tablet (20 mg total) by mouth 2 (two) times daily. 06/13/17 09/11/17 Yes Dyann Kief, PA-C  fluticasone (CUTIVATE) 0.05 % cream Apply topically 2 (two) times daily.    [provider]  Lutein 20 MG CAPS Take 1 capsule by mouth every morning.    [provider]  traMADol (ULTRAM) 50 MG tablet Take 1 tablet (50 mg total) by mouth every 6 (six) hours as needed for moderate pain. Patient not taking: Reported on 06/15/2017 05/15/17   Barrett, Erin R, PA-C  Ubiquinol 100 MG CAPS Take 1 capsule by mouth every morning.  [provider]    Physical Exam: Vitals:   06/15/17 1345 06/15/17 1400 06/15/17 1408 06/15/17 1430  BP:  116/67  114/73  Pulse: (!) 46 88  (!) 59  Resp: (!) Temp:      TempSrc:      SpO2: 96% 92%    Weight:   88.9 kg (196 lb 1 oz)   Height:          Constitutional: No acute distress, calm, in apparent discomfort Eyes: PERTLA, lids and conjunctivae normal ENMT: Mucous membranes are moist. Posterior pharynx clear of any exudate or lesions.   Neck: normal, supple, no masses, no thyromegaly Respiratory: Diminished in bases, fine rales. Mild dyspnea with speech. No pallor or cyanosis. No accessory muscle use.  Cardiovascular: Rate ~80 and irregular. 2+ pretibial edema bilaterally. No carotid bruits.  Abdomen: No distension, no tenderness, no masses palpated. Bowel sounds normal.  Musculoskeletal: no clubbing / cyanosis. No joint deformity upper and lower extremities.   Skin: Surgical scars at sternum and right forearm healing appropriately. Warm, dry,  well-perfused. Neurologic: CN 2-12 grossly intact. Sensation intact. Strength 5/5 in all 4 limbs.  Psychiatric: Alert and oriented x 3. Pleasant and cooperative.     Labs on Admission: I have personally reviewed following labs and imaging studies  CBC:  Recent Labs Lab 06/15/17 1238  WBC 6.6  NEUTROABS 4.6  HGB 12.2*  HCT 38.2*  MCV 91.6  PLT 211   Basic Metabolic Panel:  Recent Labs Lab 06/13/17 1409 06/15/17 1237  NA 138 137  K 3.7 3.3*  CL 97* 96*  CO2 31 31  GLUCOSE 133* 127*  BUN 19 23*  CREATININE 1.27* 1.53*  CALCIUM 8.8* 8.7*   GFR: Estimated Creatinine Clearance: 37.5 mL/min (A) (by C-G formula based on SCr of 1.53 mg/dL (H)). Liver Function Tests: No results for input(s): AST, ALT, ALKPHOS, BILITOT, PROT, ALBUMIN in the last 168 hours. No results for input(s): LIPASE, AMYLASE in the last 168 hours. No results for input(s): AMMONIA in the last 168 hours. Coagulation Profile: No results for input(s): INR, PROTIME in the last 168 hours. Cardiac Enzymes:  Recent Labs Lab 06/15/17 1237  TROPONINI 0.03*   BNP (last 3 results) No results for input(s): PROBNP in the last 8760 hours. HbA1C: No results for input(s): HGBA1C in the last 72 hours. CBG: No results for input(s): GLUCAP in the last 168 hours. Lipid Profile: No results for input(s): CHOL, HDL, LDLCALC, TRIG, CHOLHDL, LDLDIRECT in the last 72 hours. Thyroid Function Tests: No results for input(s): TSH, T4TOTAL, FREET4, T3FREE, THYROIDAB in the last 72 hours. Anemia Panel: No results for input(s): VITAMINB12, FOLATE, FERRITIN, TIBC, IRON, RETICCTPCT in the last 72 hours. Urine analysis:    Component Value Date/Time   COLORURINE YELLOW 05/08/2017 1243   APPEARANCEUR CLEAR 05/08/2017 1243   LABSPEC 1.008 05/08/2017 1243   PHURINE 7.0 05/08/2017 1243   GLUCOSEU NEGATIVE 05/08/2017 1243   HGBUR NEGATIVE 05/08/2017 1243   BILIRUBINUR NEGATIVE 05/08/2017 1243   KETONESUR NEGATIVE 05/08/2017  1243   PROTEINUR NEGATIVE 05/08/2017 1243   NITRITE NEGATIVE 05/08/2017 1243   LEUKOCYTESUR NEGATIVE 05/08/2017 1243   Sepsis Labs: (procalcitonin:4,lacticidven:4) )No results found for this or any previous visit (from the past 240 hour(s)).   Radiological Exams on Admission: Dg Chest 2 View  Result Date: 06/15/2017 CLINICAL DATA:  Shortness of breath. EXAM: CHEST  2 VIEW COMPARISON:  06/12/2017 FINDINGS: Sequelae of prior CABG are again identified. The  cardiac silhouette remains enlarged. Aortic atherosclerosis is noted. Bibasilar opacities are again seen. Right basilar aeration has improved, however there is persistent patchy airspace and mild interstitial opacity in the left lung base. Small bilateral pleural effusions persist, likely slightly decreased in size on the right. No pneumothorax is identified. No acute osseous abnormality is seen. IMPRESSION: 1. Improved aeration of the right lung base. 2. Persistent left and residual right basilar opacities may reflect atelectasis, asymmetric edema, or infection. 3. Small pleural effusions, slightly decreased in size on the right. Electronically Signed   By: Sebastian Ache M.D.   On: 06/15/2017 14:14    EKG: Independently reviewed. Atrial fibrillation, RBBB.   Assessment/Plan  1. Acute on chronic systolic CHF - Pt presents with SOB, wt gain despite adjustment in diuretics with addition of metolazone outpatient  - Pt is found to be dyspneic with speech, desaturating with minimal exertion, with rales and edema, elevated BNP - Echo from 3 wks ago with EF 25%, worse than prior, regional AK, moderate AS, and moderate LAE  - Treated in ED with Lasix 40 mg IV and is diuresing  - Plan to continue cardiac monitoring, continue diuresis with Lasix 40 mg IV q12h, SLIV, fluid-restrict diet, follow strict I/O's and daily wts, continue Coreg, and follow daily BMP   2. CAD  - No anginal complaints  - Underwent CABG on 05/09/17  - Continue statin,  nitrates, beta-blocker; no ACE/ARB due to renal insufficiency   3. Atrial fibrillation  - In rate-controlled atrial fibrillation on admission  - CHADS-VASc is 67 (age x2, CAD, CHF, HTN)  - Continue Eliquis and amiodarone    4. Hypertension  - BP at goal  - Continue Coreg    5. Hypokalemia - Serum potassium is 3.3 on admission - Treated with oral potassium  - Follow daily chem panel during diuresis     DVT prophylaxis: Eliquis Code Status: Full  Family Communication: Wife updated at bedside Disposition Plan: Admit to telemetry Consults called: None Admission status: Inpatient    Briscoe Deutscher, MD [facility] Pager [phone]  If 7PM-7AM, please contact night-coverage [email or web address] Password [email or web address]  06/15/2017, 3:56 PM

## 2017-06-16 DIAGNOSIS — I5043 Acute on chronic combined systolic (congestive) and diastolic (congestive) heart failure: Secondary | ICD-10-CM

## 2017-06-16 LAB — BASIC METABOLIC PANEL
ANION GAP: 10 (ref 5–15)
BUN: 22 mg/dL — AB (ref 6–20)
CHLORIDE: 93 mmol/L — AB (ref 101–111)
CO2: 36 mmol/L — ABNORMAL HIGH (ref 22–32)
CREATININE: 1.49 mg/dL — AB (ref 0.61–1.24)
Calcium: 8.8 mg/dL — ABNORMAL LOW (ref 8.9–10.3)
GFR calc non Af Amer: 40 mL/min — ABNORMAL LOW (ref >60)
GFR, EST AFRICAN AMERICAN: 46 mL/min — AB (ref >60)
Glucose, Bld: 101 mg/dL — ABNORMAL HIGH (ref 65–99)
Potassium: 2.9 mmol/L — ABNORMAL LOW (ref 3.5–5.1)
SODIUM: 139 mmol/L (ref 135–145)

## 2017-06-16 MED ORDER — POLYETHYLENE GLYCOL 3350 17 G PO PACK
17.0000 g | PACK | Freq: Every day | ORAL | Status: DC
  Administered 2017-06-16 – 2017-06-17 (×2): 17 g via ORAL
  Filled 2017-06-16 (×2): qty 1

## 2017-06-16 NOTE — Progress Notes (Signed)
Patient using home CPAP machine by himself. Told patient to call if he needed any help.

## 2017-06-16 NOTE — Progress Notes (Signed)
PROGRESS NOTE    Rykker Coviello  VWU:[id number] DOB: 1927/08/24 DOA: 06/15/2017 PCP: System, Pcp Not In     Brief Narrative:  70 showed gentleman admitted from home on 9/8 due to shortness of breath. History significant for coronary artery disease status post non-ST elevated MI and CABG last month, atrial fibrillation. He has noticed increased weights over the past week, has gained about 5-6 pounds. Saw his cardiologist 2 days ago, diuretics were adjusted with tentative plan for cardioversion once his volume status was stabilized.   Assessment & Plan:   Principal Problem:   Acute on chronic combined systolic and diastolic CHF (congestive heart failure) (HCC) Active Problems:   Hypothyroidism   Essential hypertension   Coronary artery disease   Atrial fibrillation (HCC)   Acute on chronic systolic CHF (congestive heart failure) (HCC)   Acute on chronic combined CHF -Ejection fraction of 25%, normal wall thickness elevated ventricular end-diastolic filling pressure, no mention of diastolic function. -Volume status is much improved, he is now off oxygen and shortness of breath has resolved. -Has diuresed 3.5 L overnight. Renal function is stable to slightly improved. -Continue beta blocker, aspirin, statin. Not on ACE inhibitor/ARB due to renal dysfunction.  Hypothyroidism -Continue Synthroid  Atrial fibrillation -Currently rate controlled on Coreg, amiodarone, anticoagulated on Eliquis  DVT prophylaxis:  Eliquis Code Status:  full code Family Communication:  wife at bedside Disposition Plan:  Hope for discharge home in 1-2 days  Consultants:   None  Procedures:   None  Antimicrobials:  Anti-infectives    None       Subjective: LEss SOB, no CP  Objective: Vitals:   06/16/17 0607 06/16/17 0609 06/16/17 0945 06/16/17 1405  BP: (!) 110/57  98/64 98/66  Pulse: 74  61 73  Resp: 20   20  Temp: (!) 97.5 F (36.4 C)   97.8 F (36.6 C)  TempSrc: Oral   Oral    SpO2: 94%   97%  Weight:  84.4 kg (186 lb 1.1 oz)    Height:        Intake/Output Summary (Last 24 hours) at 06/16/17 1717 Last data filed at 06/16/17 1637  Gross per 24 hour  Intake              910 ml  Output             3125 ml  Net            -2215 ml   Filed Weights   06/15/17 1408 06/15/17 1812 06/16/17 0609  Weight: 88.9 kg (196 lb 1 oz) 86 kg (189 lb 9.6 oz) 84.4 kg (186 lb 1.1 oz)    Examination:  General exam: Alert, awake, oriented x 3 Respiratory system: Clear to auscultation. Respiratory effort normal. Cardiovascular system:RRR. No murmurs, rubs, gallops. Gastrointestinal system: Abdomen is nondistended, soft and nontender. No organomegaly or masses felt. Normal bowel sounds heard. Central nervous system: Alert and oriented. No focal neurological deficits. Extremities: 1+ pitting edema bilaterally Skin: No rashes, lesions or ulcers Psychiatry: Judgement and insight appear normal. Mood & affect appropriate.     Data Reviewed: I have personally reviewed following labs and imaging studies  CBC:  Recent Labs Lab 06/15/17 1238  WBC 6.6  NEUTROABS 4.6  HGB 12.2*  HCT 38.2*  MCV 91.6  PLT 211   Basic Metabolic Panel:  Recent Labs Lab 06/13/17 1409 06/15/17 1237 06/16/17 0645  NA 138 137 139  K 3.7 3.3* 2.9*  CL 97* 96*  93*  CO2 31 31 36*  GLUCOSE 133* 127* 101*  BUN 19 23* 22*  CREATININE 1.27* 1.53* 1.49*  CALCIUM 8.8* 8.7* 8.8*   GFR: Estimated Creatinine Clearance: 35.4 mL/min (A) (by C-G formula based on SCr of 1.49 mg/dL (H)). Liver Function Tests: No results for input(s): AST, ALT, ALKPHOS, BILITOT, PROT, ALBUMIN in the last 168 hours. No results for input(s): LIPASE, AMYLASE in the last 168 hours. No results for input(s): AMMONIA in the last 168 hours. Coagulation Profile: No results for input(s): INR, PROTIME in the last 168 hours. Cardiac Enzymes:  Recent Labs Lab 06/15/17 1237  TROPONINI 0.03*   BNP (last 3 results) No  results for input(s): PROBNP in the last 8760 hours. HbA1C: No results for input(s): HGBA1C in the last 72 hours. CBG: No results for input(s): GLUCAP in the last 168 hours. Lipid Profile: No results for input(s): CHOL, HDL, LDLCALC, TRIG, CHOLHDL, LDLDIRECT in the last 72 hours. Thyroid Function Tests: No results for input(s): TSH, T4TOTAL, FREET4, T3FREE, THYROIDAB in the last 72 hours. Anemia Panel: No results for input(s): VITAMINB12, FOLATE, FERRITIN, TIBC, IRON, RETICCTPCT in the last 72 hours. Urine analysis:    Component Value Date/Time   COLORURINE YELLOW 05/08/2017 1243   APPEARANCEUR CLEAR 05/08/2017 1243   LABSPEC 1.008 05/08/2017 1243   PHURINE 7.0 05/08/2017 1243   GLUCOSEU NEGATIVE 05/08/2017 1243   HGBUR NEGATIVE 05/08/2017 1243   BILIRUBINUR NEGATIVE 05/08/2017 1243   KETONESUR NEGATIVE 05/08/2017 1243   PROTEINUR NEGATIVE 05/08/2017 1243   NITRITE NEGATIVE 05/08/2017 1243   LEUKOCYTESUR NEGATIVE 05/08/2017 1243   Sepsis Labs: @LABRCNTIP (procalcitonin:4,lacticidven:4)  )No results found for this or any previous visit (from the past 240 hour(s)).       Radiology Studies: Dg Chest 2 View  Result Date: 06/15/2017 CLINICAL DATA:  Shortness of breath. EXAM: CHEST  2 VIEW COMPARISON:  06/12/2017 FINDINGS: Sequelae of prior CABG are again identified. The cardiac silhouette remains enlarged. Aortic atherosclerosis is noted. Bibasilar opacities are again seen. Right basilar aeration has improved, however there is persistent patchy airspace and mild interstitial opacity in the left lung base. Small bilateral pleural effusions persist, likely slightly decreased in size on the right. No pneumothorax is identified. No acute osseous abnormality is seen. IMPRESSION: 1. Improved aeration of the right lung base. 2. Persistent left and residual right basilar opacities may reflect atelectasis, asymmetric edema, or infection. 3. Small pleural effusions, slightly decreased in size on  the right. Electronically Signed   By: Sebastian Ache M.D.   On: 06/15/2017 14:14        Scheduled Meds: . allopurinol  100 mg Oral Daily  . amiodarone  200 mg Oral Daily  . apixaban  5 mg Oral BID  . aspirin  81 mg Oral Daily  . carvedilol  6.25 mg Oral BID WC  . cholecalciferol  2,000 Units Oral q morning - 10a  . furosemide  40 mg Intravenous Q12H  . isosorbide mononitrate  30 mg Oral Daily  . levothyroxine  150 mcg Oral BH-q7a  . multivitamin with minerals  1 tablet Oral Daily  . polyethylene glycol  17 g Oral Daily  . potassium chloride SA  20 mEq Oral BID  . pravastatin  40 mg Oral QHS  . sodium chloride flush  3 mL Intravenous Q12H  . cyanocobalamin  100 mcg Oral q1800   Continuous Infusions: . sodium chloride       LOS: 1 day    Time spent: 30 minutes.  Greater than 50% of this time was spent in direct contact with the patient coordinating care.     Chaya Jan, MD [facility] Pager [phone]  If 7PM-7AM, please contact night-coverage [email or web address] Password [email or web address] 06/16/2017, 5:17 PM

## 2017-06-17 LAB — BASIC METABOLIC PANEL
Anion gap: 11 (ref 5–15)
BUN: 25 mg/dL — AB (ref 6–20)
CHLORIDE: 90 mmol/L — AB (ref 101–111)
CO2: 37 mmol/L — AB (ref 22–32)
Calcium: 8.9 mg/dL (ref 8.9–10.3)
Creatinine, Ser: 1.51 mg/dL — ABNORMAL HIGH (ref 0.61–1.24)
GFR calc Af Amer: 46 mL/min — ABNORMAL LOW (ref >60)
GFR calc non Af Amer: 39 mL/min — ABNORMAL LOW (ref >60)
Glucose, Bld: 108 mg/dL — ABNORMAL HIGH (ref 65–99)
POTASSIUM: 2.9 mmol/L — AB (ref 3.5–5.1)
SODIUM: 138 mmol/L (ref 135–145)

## 2017-06-17 NOTE — Progress Notes (Signed)
Discharge instructions gone over with patient and spouse, verbalized understanding. IV removed, patient tolerated procedure well. 

## 2017-06-17 NOTE — Discharge Summary (Signed)
Physician Discharge Summary  Scott [name]:[id number] DOB: [birth year] DOA: 06/15/2017  PCP: System, Pcp Not In  Admit date: 06/15/2017 Discharge date: 06/17/2017  Time spent: 45 minutes  Recommendations for Outpatient Follow-up:  -To be discharged home today. -Advised to follow up with cardiology within 1 week following discharge.   Discharge Diagnoses:  Principal Problem:   Acute on chronic combined systolic and diastolic CHF (congestive heart failure) (HCC) Active Problems:   Hypothyroidism   Essential hypertension   Coronary artery disease   Atrial fibrillation (HCC)   Acute on chronic systolic CHF (congestive heart failure) (HCC)   Discharge Condition: Stable and improved  Filed Weights   06/15/17 1812 06/16/17 0609 06/17/17 0504  Weight: 86 kg (189 lb 9.6 oz) 84.4 kg (186 lb 1.1 oz) 83.3 kg (183 lb 11.2 oz)    History of present illness:  As per Dr. Antionette Char on 9/7:  Scott [name] is a 83 y.o. male with medical history significant for coronary artery disease status post CABG last month, atrial fibrillation, chronic systolic CHF, hypertension, and hyperlipidemia, presented to the emergency department for evaluation of dyspnea. Patient has been suffering from exertional dyspnea since the recent CABG, and despite increasing his Lasix for the past week, he has gained 4 pounds. He saw his cardiologist in follow-up 2 days ago, diuretics were adjusted with tentative plan for cardioversion once his volume status is stabilized. He denies any chest pain or nausea. Reports some orthopnea. No fevers or chills.  ED Course: Upon arrival to the ED, patient is found to be afebrile, saturating adequately on room air, and with vitals otherwise stable. Patient was noted to desaturate into the 80s while standing at the bedside to urinate. EKG reveals atrial fibrillation with RBBB. Chest x-ray is notable for improved aeration at the right base and persistent left and residual right opacities,  likely representing asymmetric edema. Small pleural effusions are noted. Chemistry panel reveals a potassium of 3.3, BUN 23, and creatinine 1.53, up from 0.6 in August, not slightly from 1.27 two days earlier. CBC is notable for stable mild normocytic anemia with hemoglobin of 12.2. Troponin is at the borderline of elevation at 0.03, and BNP is elevated to 976. Patient was treated with Lasix 40 mg IV in the ED. He remained hemodynamically stable and has not been in respiratory distress while at rest. He will be admitted to the telemetry unit for ongoing evaluation and management of shortness of breath and weight gain, secondary to acute on chronic systolic CHF.  Hospital Course:   Acute on chronic combined CHF -Ejection fraction of 25%, normal wall thickness elevated ventricular end-diastolic filling pressure, no mention of diastolic function. -Volume status is much improved, he is now off oxygen and shortness of breath has resolved. -Has diuresed a total of 4.2 L since admission. Renal function is stable. -Continue beta blocker, aspirin, statin. Not on ACE inhibitor/ARB due to renal dysfunction.  CKD Stage III -Cr remains at baseline. Is 1.5 on DC.  Hypothyroidism -Continue Synthroid  Atrial fibrillation -Currently rate controlled on Coreg, amiodarone, anticoagulated on Eliquis  Procedures:  None   Consultations:  None  Discharge Instructions  Discharge Instructions    Diet - low sodium heart healthy    Complete by:  As directed    Increase activity slowly    Complete by:  As directed      Allergies as of 06/17/2017      Reactions   Nifedipine    UNSPECIFIED REACTION  Rabeprazole Sodium Nausea Only      Medication List    STOP taking these medications   metolazone 2.5 MG tablet Commonly known as:  ZAROXOLYN     TAKE these medications   acetaminophen 500 MG tablet Commonly known as:  TYLENOL Take 2 tablets (1,000 mg total) by mouth every 6 (six) hours as  needed.   allopurinol 100 MG tablet Commonly known as:  ZYLOPRIM Take 1 tablet (100 mg total) by mouth daily.   amiodarone 200 MG tablet Commonly known as:  PACERONE Take 1 tablet (200 mg total) by mouth daily.   apixaban 5 MG Tabs tablet Commonly known as:  ELIQUIS Take 1 tablet (5 mg total) by mouth 2 (two) times daily.   aspirin 81 MG chewable tablet Chew 81 mg by mouth daily.   CALCIUM CARBONATE PO Take 600 mg by mouth 2 (two) times daily.   carvedilol 6.25 MG tablet Commonly known as:  COREG Take 6.25 mg by mouth 2 (two) times daily with a meal.   fluticasone 0.05 % cream Commonly known as:  CUTIVATE Apply topically 2 (two) times daily.   isosorbide mononitrate 30 MG 24 hr tablet Commonly known as:  IMDUR Take 30 mg by mouth daily.   levothyroxine 150 MCG tablet Commonly known as:  SYNTHROID, LEVOTHROID Take 150 mcg by mouth daily before breakfast.   Lutein 20 MG Caps Take 1 capsule by mouth every morning.   multivitamin tablet Take 1 tablet by mouth daily.   nitroGLYCERIN 0.4 MG SL tablet Commonly known as:  NITROSTAT Place 0.4 mg under the tongue every 5 (five) minutes as needed for chest pain.   Potassium Chloride ER 20 MEQ Tbcr Take 20 mEq by mouth daily.   pravastatin 40 MG tablet Commonly known as:  PRAVACHOL Take 40 mg by mouth at bedtime.   TH VITAMIN B12 100 MCG tablet Generic drug:  cyanocobalamin Take 1 tablet by mouth daily. Take 1 tablet by mouth in the evening.   torsemide 20 MG tablet Commonly known as:  DEMADEX Take 1 tablet (20 mg total) by mouth 2 (two) times daily.   traMADol 50 MG tablet Commonly known as:  ULTRAM Take 1 tablet (50 mg total) by mouth every 6 (six) hours as needed for moderate pain.   Ubiquinol 100 MG Caps Take 1 capsule by mouth every morning.   VITAMIN D-1000 MAX ST 1000 units tablet Generic drug:  Cholecalciferol Take 2,000 Units by mouth every morning.            Discharge Care Instructions         Start     Ordered   06/17/17 0000  Increase activity slowly     06/17/17 1242   06/17/17 0000  Diet - low sodium heart healthy     06/17/17 1242     Allergies  Allergen Reactions  . Nifedipine     UNSPECIFIED REACTION   . Rabeprazole Sodium Nausea Only   Follow-up Information    Antoine Poche, MD. Schedule an appointment as soon as possible for a visit in 1 week(s).   Specialty:  Cardiology Contact information: [address] [place] [address] [phone]            The results of significant diagnostics from this hospitalization (including imaging, microbiology, ancillary and laboratory) are listed below for reference.    Significant Diagnostic Studies: Dg Chest 2 View  Result Date: 06/15/2017 CLINICAL DATA:  Shortness of breath. EXAM: CHEST  2 VIEW COMPARISON:  06/12/2017 FINDINGS: Sequelae of prior CABG are again identified. The cardiac silhouette remains enlarged. Aortic atherosclerosis is noted. Bibasilar opacities are again seen. Right basilar aeration has improved, however there is persistent patchy airspace and mild interstitial opacity in the left lung base. Small bilateral pleural effusions persist, likely slightly decreased in size on the right. No pneumothorax is identified. No acute osseous abnormality is seen. IMPRESSION: 1. Improved aeration of the right lung base. 2. Persistent left and residual right basilar opacities may reflect atelectasis, asymmetric edema, or infection. 3. Small pleural effusions, slightly decreased in size on the right. Electronically Signed   By: Sebastian Ache M.D.   On: 06/15/2017 14:14   Dg Chest 2 View  Result Date: 06/12/2017 CLINICAL DATA:  Shortness of breath and dry cough. EXAM: CHEST  2 VIEW COMPARISON:  05/24/2017 FINDINGS: The cardio pericardial silhouette is enlarged. Extensive calcification again noted in the wall the left ventricle consistent with prior infarct. Patient is status post CABG. Bibasilar atelectasis  with small bilateral pleural effusions again noted, slightly progressed on the left. No overt pulmonary edema. The visualized bony structures of the thorax are intact. IMPRESSION: 1. Persistent bibasilar atelectasis with small bilateral pleural effusions, slightly progressed on the left. Electronically Signed   By: Kennith Center M.D.   On: 06/12/2017 10:31   Dg Chest 2 View  Result Date: 05/24/2017 CLINICAL DATA:  Shortness of Breath EXAM: CHEST  2 VIEW COMPARISON:  05/22/2017 FINDINGS: Prior CABG periods have small bilateral pleural effusions, right greater than left with bibasilar atelectasis or infiltrates. Calcification on the anterior wall of the heart again noted, stable. No acute bony abnormality. IMPRESSION: Continued bilateral effusions with bibasilar atelectasis or infiltrates. Stable cardiomegaly. Electronically Signed   By: Charlett Nose M.D.   On: 05/24/2017 09:25   Dg Chest 2 View  Result Date: 05/22/2017 CLINICAL DATA:  Shortness of breath with lower extremity edema EXAM: CHEST  2 VIEW COMPARISON:  May 13, 2017 FINDINGS: There is interstitial pulmonary edema. There is a small right pleural effusion. There is atelectatic change in both mid and lower lung zones. There is mild airspace consolidation in the left base and medial right base regions. There is cardiomegaly with mild pulmonary venous hypertension. There is aortic atherosclerosis. Patient is status post coronary artery bypass grafting. No evident adenopathy. There is degenerative change in the thoracic spine. There is calcification along the anterior left ventricular wall. There is calcification left carotid artery. IMPRESSION: Evidence of a degree of congestive heart failure. Bibasilar atelectasis. Mild consolidation in the lung bases may be due to atelectasis but may also represent patchy bibasilar pneumonia. Stable cardiomegaly. Calcification along the anterior left ventricular wall is consistent with prior myocardial infarct in  this area. There is aortic atherosclerosis. There is left carotid artery calcification. Aortic Atherosclerosis (ICD10-I70.0). Electronically Signed   By: Bretta Bang III M.D.   On: 05/22/2017 21:14   Dg Abdomen 1 View  Result Date: 05/22/2017 CLINICAL DATA:  Constipation for 1 week EXAM: ABDOMEN - 1 VIEW COMPARISON:  None. FINDINGS: There is moderate stool in the colon. There is no appreciable bowel dilatation or air-fluid level to suggest bowel obstruction. No free air. There are surgical clips in the right upper quadrant. IMPRESSION: Moderate stool in colon.  No bowel obstruction or free air. Electronically Signed   By: Bretta Bang III M.D.   On: 05/22/2017 21:11    Microbiology: No results found for this or any previous visit (from  the past 240 hour(s)).   Labs: Basic Metabolic Panel:  Recent Labs Lab 06/13/17 1409 06/15/17 1237 06/16/17 0645 06/17/17 0745  NA 138 137 139 138  K 3.7 3.3* 2.9* 2.9*  CL 97* 96* 93* 90*  CO2 31 31 36* 37*  GLUCOSE 133* 127* 101* 108*  BUN 19 23* 22* 25*  CREATININE 1.27* 1.53* 1.49* 1.51*  CALCIUM 8.8* 8.7* 8.8* 8.9   Liver Function Tests: No results for input(s): AST, ALT, ALKPHOS, BILITOT, PROT, ALBUMIN in the last 168 hours. No results for input(s): LIPASE, AMYLASE in the last 168 hours. No results for input(s): AMMONIA in the last 168 hours. CBC:  Recent Labs Lab 06/15/17 1238  WBC 6.6  NEUTROABS 4.6  HGB 12.2*  HCT 38.2*  MCV 91.6  PLT 211   Cardiac Enzymes:  Recent Labs Lab 06/15/17 1237  TROPONINI 0.03*   BNP: BNP (last 3 results)  Recent Labs  05/22/17 2039 05/22/17 2358 06/15/17 1233  BNP 884.0* 908.0* 976.0*    ProBNP (last 3 results) No results for input(s): PROBNP in the last 8760 hours.  CBG: No results for input(s): GLUCAP in the last 168 hours.     SignedChaya Jan  [facility] Pager: [phone] 06/17/2017, 12:42 PM

## 2017-06-18 ENCOUNTER — Telehealth: Payer: Self-pay | Admitting: Physician Assistant

## 2017-06-18 NOTE — Telephone Encounter (Signed)
Patient's wife is calling regarding to medication dosages post d/c [facility]. / tg

## 2017-06-18 NOTE — Telephone Encounter (Signed)
Returned call to pt's wife, she was confused about husband's discharge instructions. I went over them with her and also informed her of some information on a low sodium diet. She voiced understanding and stated he has a follow up appointment Wednesday.

## 2017-06-20 ENCOUNTER — Ambulatory Visit (INDEPENDENT_AMBULATORY_CARE_PROVIDER_SITE_OTHER): Payer: Medicare (Managed Care) | Admitting: Physician Assistant

## 2017-06-20 ENCOUNTER — Telehealth: Payer: Self-pay | Admitting: *Deleted

## 2017-06-20 ENCOUNTER — Other Ambulatory Visit (HOSPITAL_COMMUNITY)
Admission: RE | Admit: 2017-06-20 | Discharge: 2017-06-20 | Disposition: A | Discharge status: Home / Self Care | Payer: Medicare (Managed Care) | Source: Ambulatory Visit | Attending: Physician Assistant | Admitting: Physician Assistant

## 2017-06-20 ENCOUNTER — Encounter: Payer: Self-pay | Admitting: Physician Assistant

## 2017-06-20 VITALS — BP 120/58 | HR 72 | Ht 70.0 in | Wt 189.0 lb

## 2017-06-20 DIAGNOSIS — I481 Persistent atrial fibrillation: Secondary | ICD-10-CM | POA: Diagnosis not present

## 2017-06-20 DIAGNOSIS — I5042 Chronic combined systolic (congestive) and diastolic (congestive) heart failure: Secondary | ICD-10-CM

## 2017-06-20 DIAGNOSIS — I255 Ischemic cardiomyopathy: Secondary | ICD-10-CM | POA: Diagnosis not present

## 2017-06-20 DIAGNOSIS — I5043 Acute on chronic combined systolic (congestive) and diastolic (congestive) heart failure: Secondary | ICD-10-CM | POA: Diagnosis present

## 2017-06-20 DIAGNOSIS — I4819 Other persistent atrial fibrillation: Secondary | ICD-10-CM

## 2017-06-20 DIAGNOSIS — I420 Dilated cardiomyopathy: Secondary | ICD-10-CM | POA: Diagnosis present

## 2017-06-20 DIAGNOSIS — I25110 Atherosclerotic heart disease of native coronary artery with unstable angina pectoris: Secondary | ICD-10-CM

## 2017-06-20 LAB — BASIC METABOLIC PANEL
ANION GAP: 12 (ref 5–15)
BUN: 21 mg/dL — ABNORMAL HIGH (ref 6–20)
CO2: 34 mmol/L — AB (ref 22–32)
Calcium: 8.7 mg/dL — ABNORMAL LOW (ref 8.9–10.3)
Chloride: 89 mmol/L — ABNORMAL LOW (ref 101–111)
Creatinine, Ser: 1.42 mg/dL — ABNORMAL HIGH (ref 0.61–1.24)
GFR calc Af Amer: 49 mL/min — ABNORMAL LOW (ref >60)
GFR calc non Af Amer: 43 mL/min — ABNORMAL LOW (ref >60)
GLUCOSE: 113 mg/dL — AB (ref 65–99)
POTASSIUM: 3.3 mmol/L — AB (ref 3.5–5.1)
Sodium: 135 mmol/L (ref 135–145)

## 2017-06-20 MED ORDER — POTASSIUM CHLORIDE CRYS ER 20 MEQ PO TBCR: 20.0000 meq | EXTENDED_RELEASE_TABLET | Freq: Two times a day (BID) | ORAL | 3 refills | Status: DC

## 2017-06-20 NOTE — Addendum Note (Signed)
Addended by: Kerney Elbe on: 06/20/2017 04:28 PM   Modules accepted: Orders

## 2017-06-20 NOTE — Patient Instructions (Signed)
Medication Instructions:  Your physician recommends that you continue on your current medications as directed. Please refer to the Current Medication list given to you today.   Labwork: Your physician recommends that you return for lab work in: NOW    Testing/Procedures: Your physician has recommended that you have a Cardioversion (DCCV). Electrical Cardioversion uses a jolt of electricity to your heart either through paddles or wired patches attached to your chest. This is a controlled, usually prescheduled, procedure. Defibrillation is done under light anesthesia in the hospital, and you usually go home the day of the procedure. This is done to get your heart back into a normal rhythm. You are not awake for the procedure. Please see the instruction sheet given to you today.    Follow-Up: Your physician recommends that you schedule a follow-up appointment in 2 Weeks after your Cardioversion.    Any Other Special Instructions Will Be Listed Below (If Applicable). Thank you for choosing [facility]!      If you need a refill on your cardiac medications before your next appointment, please call your pharmacy.

## 2017-06-20 NOTE — Telephone Encounter (Signed)
Called pt and wife. Given cardioversion time and date and instructions. Wife voiced understanding of instructions and medication changes.

## 2017-06-20 NOTE — Progress Notes (Signed)
Cardiology Office Note    Date:  06/20/2017   ID:  Scott [name], DOB December 19, 1926, MRN [id number]  PCP:  System, Pcp Not In  Cardiologist: will be seeing Dr. Lucy Chris, [place] / Dr. Purvis Sheffield while here  Chief Complaint  Patient presents with  . Follow-up    History of Present Illness:  Scott [name] is a 83 y.o. male with known CAD. He suffered in MI in 1987 with subsequent PCI with stent placement in 1999 and 2013 or 2014. He also has a history of CHF, OSA, Hypertension, Hypothyroidism. He is here visiting from [place]. 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 He underwent CABG x 3 utilizing LIMA to OM1, Free RIMA to Ramus Intermediate, and Right radial artery to PDA. Postop had bradycardia so Coreg was stopped then developed atrial fibrillation and was placed on amiodarone.   Patient was discharge 05/13/17. [facility] with acute respiratory failure with hypoxia secondary to CHF and question of pneumonia. BNP was over 900. He may gotten extra salt in his diet. His creatinine began to increase and BP was low so ACE inhibitor was held. Ultimate plan for outpatient DC CV once his volume is stabilized. 2-D echo 05/23/17 estimated EF 25% with moderate aortic stenosis. See report below for details.Crt 1.47 at discharge.   I saw the patient in f/u 05/30/17 and he was feeling better. Thought he was in NSR but was still in afib. Crt 1.44 that day down from 1.47.    Patient saw Dr. Dorris Fetch yest and felt to be fluid overloaded with small bilateral effusions and edema. His lasix wasn't changed but he felt like the patient would benefit from cardioversion.    I saw the patient 06/13/17 and he was very short of breath. His weight was up 4 pounds, he had trouble walking and was having orthopnea. Dr. Purvis Sheffield recommended changing his Lasix to torsemide 20 mg twice a day  and adding metolazone only if he didn't improve by Friday. It was also felt that he might improve if he had a cardioversion which was a plan once he was no longer orthopneic.  He went to the emergency room 06/15/17 for worsening dyspnea and no improvement. He took the metolazone morning but didn't start urinating until after he got to the emergency room. He was desaturating in the 80s while standing at the bedside. He diuresed 4.2 L and creatinine was 1.5 at discharge. He was sent home on Demadex 20 mg twice a day. Discharge weight 183 pounds down 6 pounds from admission weight it 189 pounds. Our scales weigh him at 198 pounds 06/13/17.  Patient comes in today accompanied by his wife and sister-in-law. He is feeling much better. His weight has been stable on their scales at home. He hasn't gained any since discharge. He is 189 pounds on our scales. He still has a low dyspnea on exertion but overall is looking and feeling much better. Heart rate is still irregular.   Past Medical History:  Diagnosis Date  . Angina pectoris (HCC)   . Chronic combined systolic and diastolic heart failure, NYHA class 2 (HCC) 2014   EF reportedly anywhere from 25-40%  . Coronary artery disease involving native coronary artery    Reportedly multivessel  . Essential hypertension   . Gout   . History of MI (myocardial infarction)   . Hyperlipemia   .  Hyperlipidemia   . Hypothyroidism   . Ischemic dilated cardiomyopathy (HCC)   . Multi-vessel coronary artery stenosis   . Varicose veins of both lower extremities     Past Surgical History:  Procedure Laterality Date  . CHOLECYSTECTOMY    . CORONARY ARTERY BYPASS GRAFT N/A 05/09/2017   Procedure: CORONARY ARTERY BYPASS GRAFTING (CABG) times 3 using the right radial artery and bilateral internal mammary arteries.;  Surgeon: Loreli Slot, MD;  Location: [facility] OR;  Service: Open Heart Surgery;  Laterality: N/A;  . LEFT HEART CATH AND CORONARY ANGIOGRAPHY N/A 04/30/2017    Procedure: Left Heart Cath and Coronary Angiography;  Surgeon: Runell Gess, MD;  Location: [facility] INVASIVE CV LAB;  Service: Cardiovascular;  Laterality: N/A;  . RADIAL ARTERY HARVEST Right 05/09/2017   Procedure: RADIAL ARTERY HARVEST;  Surgeon: Loreli Slot, MD;  Location: [facility] OR;  Service: Open Heart Surgery;  Laterality: Right;  . TEE WITHOUT CARDIOVERSION N/A 05/04/2017   Procedure: TRANSESOPHAGEAL ECHOCARDIOGRAM (TEE);  Surgeon: Quintella Reichert, MD;  Location: [facility] ENDOSCOPY;  Service: Cardiovascular;  Laterality: N/A;  . TEE WITHOUT CARDIOVERSION N/A 05/09/2017   Procedure: TRANSESOPHAGEAL ECHOCARDIOGRAM (TEE);  Surgeon: Loreli Slot, MD;  Location: [facility] OR;  Service: Open Heart Surgery;  Laterality: N/A;  . VASCULAR SURGERY     Vein stripping    Current Medications: No outpatient prescriptions have been marked as taking for the 06/20/17 encounter (Office Visit) with Dyann Kief, PA-C.     Allergies:   Nifedipine and Rabeprazole sodium   Social History   Social History  . Marital status: Married    Spouse name: N/A  . Number of children: 3  . Years of education: N/A   Social History Main Topics  . Smoking status: Never Smoker  . Smokeless tobacco: Never Used  . Alcohol use Yes     Comment: Social  . Drug use: No  . Sexual activity: No   Other Topics Concern  . None   Social History Narrative   Remarried, with current wife 1 year. Has 3 children with his former wife. Visiting from [place].     Family History:  The patient's family history includes Bladder Cancer in his brother and father; Heart disease in his mother; Hypertension in his son.   ROS:   Please see the history of present illness.    Review of Systems  Constitution: Positive for weakness and malaise/fatigue.  HENT: Positive for hearing loss.   Cardiovascular: Positive for dyspnea on exertion and irregular heartbeat.  Respiratory: Negative.   Endocrine: Negative.     Hematologic/Lymphatic: Negative.   Musculoskeletal: Negative.   Gastrointestinal: Positive for constipation.  Genitourinary: Negative.    All other systems reviewed and are negative.   PHYSICAL EXAM:   VS:  BP (!) 120/58   Pulse 72   Ht  (1.778 m)   Wt 189 lb (85.7 kg)   SpO2 98%   BMI 27.12 kg/m   Physical Exam  GEN: Well nourished, well developed, in no acute distress  Neck: no JVD, carotid bruits, or masses Cardiac: Irregular irregular with 1/6 systolic murmur at the left sternal border Respiratory:  Decreased breath sounds at the right base, no Rales GI: soft, nontender, nondistended, + BS Ext: without cyanosis, clubbing, or edema, Good distal pulses bilaterally Neuro:  Alert and Oriented x 3, Strength and sensation are intact Psych: euthymic mood, full affect  Wt Readings from Last 3 Encounters:  06/20/17 189 lb (85.7 kg)  06/17/17 183 lb 11.2 oz (83.3 kg)  06/13/17 198 lb 3.2 oz (89.9 kg)      Studies/Labs Reviewed:   EKG:  EKG is not ordered today.  The ekg reviewed from 05/18/17 and showed atrial fibrillation  Recent Labs: 04/27/2017: TSH 0.713 05/23/2017: Magnesium 2.1 05/24/2017: ALT 21 06/15/2017: B Natriuretic Peptide 976.0; Hemoglobin 12.2; Platelets 211 06/17/2017: BUN 25; Creatinine, Ser 1.51; Potassium 2.9; Sodium 138   Lipid Panel No results found for: CHOL, TRIG, HDL, CHOLHDL, VLDL, LDLCALC, LDLDIRECT  Additional studies/ records that were reviewed today include:   Echo 05/23/17- Study Conclusions   - Left ventricle: RWMA;s hard to see even with definity Septal and   apical akineis mid and basal inferior wall hypokinesis. The   cavity size was moderately dilated. Wall thickness was normal.   The estimated ejection fraction was 25%. Doppler parameters are   consistent with both elevated ventricular end-diastolic filling   pressure and elevated left atrial filling pressure. - Aortic valve: Given low EF overall AS at least moderate and    morphologically significant calcification and thickening with   restricted leaflet motion. Valve area (VTI): 1 cm^2. Valve area   (Vmax): 1.26 cm^2. Valve area (Vmean): 1.01 cm^2. - Left atrium: The atrium was moderately dilated. - Atrial septum: No defect or patent foramen ovale was identified. - Pulmonary arteries: PA peak pressure: 42 mm Hg (S). - Pericardium, extracardiac: A trivial pericardial effusion was   identified. There was a left pleural effusion.    ASSESSMENT:    1. Chronic combined systolic and diastolic CHF (congestive heart failure) (HCC)   2. Ischemic dilated cardiomyopathy (HCC)   3. Coronary artery disease involving native coronary artery of native heart with unstable angina pectoris (HCC)   4. Persistent atrial fibrillation (HCC)      PLAN:  In order of problems listed above: Chronic combined systolic and diastolic CHF ejection fraction 25% and difficult to diurese as an outpatient prompting another hospitalization. Suspect his atrial fibrillation is contributing to his CHF and Dr.Koneswaran recommends cardioversion once he's stable from CHF standpoint. Discharge weight 183 pounds. 189 pounds on our scales which is down 9 pounds since last office visit. Heart failure is well compensated. Potassium was 2.9 at discharge. Needs to be rechecked.  Ischemic cardiomyopathy ejection fraction 25% echoes 05/23/17 lower than previously at 30-35%.  CAD status post CABG 3 05/09/17, really slow to recover with several admissions for heart failure and not tolerating atrial fibrillation.  Persistent atrial fibrillation on amiodarone and Eliquis plan is for DCCV once CHF stable patient hasn't used any doses of Eliquis. Plan for cardioversion tomorrow if potassium is normal. Check bmet today. Follow-up with me in 1-2 weeks. Patient hoping to return to [place] on by October 11.    Medication Adjustments/Labs and Tests Ordered: Current medicines are reviewed at length with the patient  today.  Concerns regarding medicines are outlined above.  Medication changes, Labs and Tests ordered today are listed in the Patient Instructions below. Patient Instructions  Medication Instructions:  Your physician recommends that you continue on your current medications as directed. Please refer to the Current Medication list given to you today.   Labwork: Your physician recommends that you return for lab work in: NOW    Testing/Procedures: Your physician has recommended that you have a Cardioversion (DCCV). Electrical Cardioversion uses a jolt of electricity to your heart either through paddles or wired patches attached to your chest. This is a controlled, usually prescheduled, procedure. Defibrillation is  done under light anesthesia in the hospital, and you usually go home the day of the procedure. This is done to get your heart back into a normal rhythm. You are not awake for the procedure. Please see the instruction sheet given to you today.    Follow-Up: Your physician recommends that you schedule a follow-up appointment in 2 Weeks after your Cardioversion.    Any Other Special Instructions Will Be Listed Below (If Applicable). Thank you for choosing [facility]!      If you need a refill on your cardiac medications before your next appointment, please call your pharmacy.      Elson Clan, PA-C  06/20/2017 2:53 PM    [facility] [address] [place], [place], [place]  [address] Phone: [phone]; Fax: [phone]

## 2017-06-21 ENCOUNTER — Encounter (HOSPITAL_COMMUNITY): Payer: Self-pay

## 2017-06-21 ENCOUNTER — Ambulatory Visit (HOSPITAL_COMMUNITY): Payer: Medicare (Managed Care) | Admitting: Anesthesiology

## 2017-06-21 ENCOUNTER — Other Ambulatory Visit: Payer: Self-pay

## 2017-06-21 ENCOUNTER — Telehealth: Payer: Self-pay

## 2017-06-21 ENCOUNTER — Telehealth: Payer: Self-pay | Admitting: *Deleted

## 2017-06-21 ENCOUNTER — Ambulatory Visit (HOSPITAL_COMMUNITY)
Admission: RE | Admit: 2017-06-21 | Discharge: 2017-06-21 | Disposition: A | Discharge status: Home / Self Care | Payer: Medicare (Managed Care) | Source: Ambulatory Visit | Attending: Cardiovascular Disease | Admitting: Cardiovascular Disease

## 2017-06-21 ENCOUNTER — Encounter (HOSPITAL_COMMUNITY)
Admission: RE | Disposition: A | Discharge status: Home / Self Care | Payer: Self-pay | Source: Ambulatory Visit | Attending: Cardiovascular Disease

## 2017-06-21 DIAGNOSIS — E039 Hypothyroidism, unspecified: Secondary | ICD-10-CM | POA: Insufficient documentation

## 2017-06-21 DIAGNOSIS — I110 Hypertensive heart disease with heart failure: Secondary | ICD-10-CM | POA: Diagnosis not present

## 2017-06-21 DIAGNOSIS — I255 Ischemic cardiomyopathy: Secondary | ICD-10-CM | POA: Diagnosis not present

## 2017-06-21 DIAGNOSIS — I252 Old myocardial infarction: Secondary | ICD-10-CM | POA: Insufficient documentation

## 2017-06-21 DIAGNOSIS — I420 Dilated cardiomyopathy: Secondary | ICD-10-CM | POA: Diagnosis not present

## 2017-06-21 DIAGNOSIS — E785 Hyperlipidemia, unspecified: Secondary | ICD-10-CM | POA: Diagnosis not present

## 2017-06-21 DIAGNOSIS — I4891 Unspecified atrial fibrillation: Secondary | ICD-10-CM | POA: Insufficient documentation

## 2017-06-21 DIAGNOSIS — Z951 Presence of aortocoronary bypass graft: Secondary | ICD-10-CM | POA: Diagnosis not present

## 2017-06-21 DIAGNOSIS — I4819 Other persistent atrial fibrillation: Secondary | ICD-10-CM

## 2017-06-21 DIAGNOSIS — Z955 Presence of coronary angioplasty implant and graft: Secondary | ICD-10-CM | POA: Insufficient documentation

## 2017-06-21 DIAGNOSIS — I5042 Chronic combined systolic (congestive) and diastolic (congestive) heart failure: Secondary | ICD-10-CM | POA: Diagnosis not present

## 2017-06-21 DIAGNOSIS — M109 Gout, unspecified: Secondary | ICD-10-CM | POA: Insufficient documentation

## 2017-06-21 DIAGNOSIS — I481 Persistent atrial fibrillation: Secondary | ICD-10-CM | POA: Insufficient documentation

## 2017-06-21 DIAGNOSIS — I2510 Atherosclerotic heart disease of native coronary artery without angina pectoris: Secondary | ICD-10-CM | POA: Insufficient documentation

## 2017-06-21 DIAGNOSIS — I5043 Acute on chronic combined systolic (congestive) and diastolic (congestive) heart failure: Secondary | ICD-10-CM

## 2017-06-21 DIAGNOSIS — G4733 Obstructive sleep apnea (adult) (pediatric): Secondary | ICD-10-CM | POA: Insufficient documentation

## 2017-06-21 HISTORY — PX: CARDIOVERSION: SHX1299

## 2017-06-21 SURGERY — CARDIOVERSION
Anesthesia: Monitor Anesthesia Care

## 2017-06-21 MED ORDER — HYDROCORTISONE 1 % EX CREA
1.0000 "application " | TOPICAL_CREAM | Freq: Three times a day (TID) | CUTANEOUS | Status: DC | PRN
  Filled 2017-06-21: qty 28

## 2017-06-21 MED ORDER — SODIUM CHLORIDE 0.9% FLUSH: 3.0000 mL | Freq: Two times a day (BID) | INTRAVENOUS | Status: DC

## 2017-06-21 MED ORDER — CARVEDILOL 3.125 MG PO TABS: 3.1250 mg | ORAL_TABLET | Freq: Two times a day (BID) | ORAL | 3 refills | Status: AC

## 2017-06-21 MED ORDER — PROPOFOL 10 MG/ML IV BOLUS
INTRAVENOUS | Status: AC
  Filled 2017-06-21: qty 20

## 2017-06-21 MED ORDER — SODIUM CHLORIDE 0.9 % IV SOLN: 250.0000 mL | INTRAVENOUS | Status: DC

## 2017-06-21 MED ORDER — SODIUM CHLORIDE 0.9% FLUSH: 3.0000 mL | INTRAVENOUS | Status: DC | PRN

## 2017-06-21 MED ORDER — PROPOFOL 500 MG/50ML IV EMUL
INTRAVENOUS | Status: DC | PRN
  Administered 2017-06-21: 125 ug/kg/min via INTRAVENOUS

## 2017-06-21 MED ORDER — LACTATED RINGERS IV SOLN
INTRAVENOUS | Status: DC
  Administered 2017-06-21 (×2): via INTRAVENOUS

## 2017-06-21 NOTE — H&P (View-Only) (Signed)
 Cardiology Office Note    Date:  06/20/2017   ID:  Scott [name], DOB 02/13/1927, MRN [id number]  PCP:  System, Pcp Not In  Cardiologist: will be seeing Dr. Steven Cook, [place] / Dr. Koneswaran while here  Chief Complaint  Patient presents with  . Follow-up    History of Present Illness:  Scott [name] is a 83 y.o. male with known CAD. He suffered in MI in 1987 with subsequent PCI with stent placement in 1999 and 2013 or 2014. He also has a history of CHF, OSA, Hypertension, Hypothyroidism. He is here visiting from [place]. 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 He underwent CABG x 3 utilizing LIMA to OM1, Free RIMA to Ramus Intermediate, and Right radial artery to PDA. Postop had bradycardia so Coreg was stopped then developed atrial fibrillation and was placed on amiodarone.   Patient was discharge 05/13/17. He was readmitted[facility] 05/22/17 with acute respiratory failure with hypoxia secondary to CHF and question of pneumonia. BNP was over 900. He may gotten extra salt in his diet. His creatinine began to increase and BP was low so ACE inhibitor was held. Ultimate plan for outpatient DC CV once his volume is stabilized. 2-D echo 05/23/17 estimated EF 25% with moderate aortic stenosis. See report below for details.Crt 1.47 at discharge.   I saw the patient in f/u 05/30/17 and he was feeling better. Thought he was in NSR but was still in afib. Crt 1.44 that day down from 1.47.    Patient saw Dr. Hendrickson yest and felt to be fluid overloaded with small bilateral effusions and edema. His lasix wasn't changed but he felt like the patient would benefit from cardioversion.    I saw the patient 06/13/17 and he was very short of breath. His weight was up 4 pounds, he had trouble walking and was having orthopnea. Dr. Koneswaran recommended changing his Lasix to torsemide 20 mg twice a day  and adding metolazone only if he didn't improve by Friday. It was also felt that he might improve if he had a cardioversion which was a plan once he was no longer orthopneic.  He went to the emergency room 06/15/17 for worsening dyspnea and no improvement. He took the metolazone morning but didn't start urinating until after he got to the emergency room. He was desaturating in the 80s while standing at the bedside. He diuresed 4.2 L and creatinine was 1.5 at discharge. He was sent home on Demadex 20 mg twice a day. Discharge weight 183 pounds down 6 pounds from admission weight it 189 pounds. Our scales weigh him at 198 pounds 06/13/17.  Patient comes in today accompanied by his wife and sister-in-law. He is feeling much better. His weight has been stable on their scales at home. He hasn't gained any since discharge. He is 189 pounds on our scales. He still has a low dyspnea on exertion but overall is looking and feeling much better. Heart rate is still irregular.   Past Medical History:  Diagnosis Date  . Angina pectoris (HCC)   . Chronic combined systolic and diastolic heart failure, NYHA class 2 (HCC) 2014   EF reportedly anywhere from 25-40%  . Coronary artery disease involving native coronary artery    Reportedly multivessel  . Essential hypertension   . Gout   . History of MI (myocardial infarction)   . Hyperlipemia   .   Hyperlipidemia   . Hypothyroidism   . Ischemic dilated cardiomyopathy (HCC)   . Multi-vessel coronary artery stenosis   . Varicose veins of both lower extremities     Past Surgical History:  Procedure Laterality Date  . CHOLECYSTECTOMY    . CORONARY ARTERY BYPASS GRAFT N/A 05/09/2017   Procedure: CORONARY ARTERY BYPASS GRAFTING (CABG) times 3 using the right radial artery and bilateral internal mammary arteries.;  Surgeon: Hendrickson, Steven C, MD;  Location: [facility] OR;  Service: Open Heart Surgery;  Laterality: N/A;  . LEFT HEART CATH AND CORONARY ANGIOGRAPHY N/A 04/30/2017    Procedure: Left Heart Cath and Coronary Angiography;  Surgeon: Berry, Jonathan J, MD;  Location: [facility] INVASIVE CV LAB;  Service: Cardiovascular;  Laterality: N/A;  . RADIAL ARTERY HARVEST Right 05/09/2017   Procedure: RADIAL ARTERY HARVEST;  Surgeon: Hendrickson, Steven C, MD;  Location: [facility] OR;  Service: Open Heart Surgery;  Laterality: Right;  . TEE WITHOUT CARDIOVERSION N/A 05/04/2017   Procedure: TRANSESOPHAGEAL ECHOCARDIOGRAM (TEE);  Surgeon: Turner, Traci R, MD;  Location: [facility] ENDOSCOPY;  Service: Cardiovascular;  Laterality: N/A;  . TEE WITHOUT CARDIOVERSION N/A 05/09/2017   Procedure: TRANSESOPHAGEAL ECHOCARDIOGRAM (TEE);  Surgeon: Hendrickson, Steven C, MD;  Location: [facility] OR;  Service: Open Heart Surgery;  Laterality: N/A;  . VASCULAR SURGERY     Vein stripping    Current Medications: No outpatient prescriptions have been marked as taking for the 06/20/17 encounter (Office Visit) with Cerrone Debold M, PA-C.     Allergies:   Nifedipine and Rabeprazole sodium   Social History   Social History  . Marital status: Married    Spouse name: N/A  . Number of children: 3  . Years of education: N/A   Social History Main Topics  . Smoking status: Never Smoker  . Smokeless tobacco: Never Used  . Alcohol use Yes     Comment: Social  . Drug use: No  . Sexual activity: No   Other Topics Concern  . None   Social History Narrative   Remarried, with current wife 1 year. Has 3 children with his former wife. Visiting from [place].     Family History:  The patient's family history includes Bladder Cancer in his brother and father; Heart disease in his mother; Hypertension in his son.   ROS:   Please see the history of present illness.    Review of Systems  Constitution: Positive for weakness and malaise/fatigue.  HENT: Positive for hearing loss.   Cardiovascular: Positive for dyspnea on exertion and irregular heartbeat.  Respiratory: Negative.   Endocrine: Negative.     Hematologic/Lymphatic: Negative.   Musculoskeletal: Negative.   Gastrointestinal: Positive for constipation.  Genitourinary: Negative.    All other systems reviewed and are negative.   PHYSICAL EXAM:   VS:  BP (!) 120/58   Pulse 72   Ht 5' 10" (1.778 m)   Wt 189 lb (85.7 kg)   SpO2 98%   BMI 27.12 kg/m   Physical Exam  GEN: Well nourished, well developed, in no acute distress  Neck: no JVD, carotid bruits, or masses Cardiac: Irregular irregular with 1/6 systolic murmur at the left sternal border Respiratory:  Decreased breath sounds at the right base, no Rales GI: soft, nontender, nondistended, + BS Ext: without cyanosis, clubbing, or edema, Good distal pulses bilaterally Neuro:  Alert and Oriented x 3, Strength and sensation are intact Psych: euthymic mood, full affect  Wt Readings from Last 3 Encounters:  06/20/17 189 lb (85.7 kg)    06/17/17 183 lb 11.2 oz (83.3 kg)  06/13/17 198 lb 3.2 oz (89.9 kg)      Studies/Labs Reviewed:   EKG:  EKG is not ordered today.  The ekg reviewed from 05/18/17 and showed atrial fibrillation  Recent Labs: 04/27/2017: TSH 0.713 05/23/2017: Magnesium 2.1 05/24/2017: ALT 21 06/15/2017: B Natriuretic Peptide 976.0; Hemoglobin 12.2; Platelets 211 06/17/2017: BUN 25; Creatinine, Ser 1.51; Potassium 2.9; Sodium 138   Lipid Panel No results found for: CHOL, TRIG, HDL, CHOLHDL, VLDL, LDLCALC, LDLDIRECT  Additional studies/ records that were reviewed today include:   Echo 05/23/17- Study Conclusions   - Left ventricle: RWMA;s hard to see even with definity Septal and   apical akineis mid and basal inferior wall hypokinesis. The   cavity size was moderately dilated. Wall thickness was normal.   The estimated ejection fraction was 25%. Doppler parameters are   consistent with both elevated ventricular end-diastolic filling   pressure and elevated left atrial filling pressure. - Aortic valve: Given low EF overall AS at least moderate and    morphologically significant calcification and thickening with   restricted leaflet motion. Valve area (VTI): 1 cm^2. Valve area   (Vmax): 1.26 cm^2. Valve area (Vmean): 1.01 cm^2. - Left atrium: The atrium was moderately dilated. - Atrial septum: No defect or patent foramen ovale was identified. - Pulmonary arteries: PA peak pressure: 42 mm Hg (S). - Pericardium, extracardiac: A trivial pericardial effusion was   identified. There was a left pleural effusion.    ASSESSMENT:    1. Chronic combined systolic and diastolic CHF (congestive heart failure) (HCC)   2. Ischemic dilated cardiomyopathy (HCC)   3. Coronary artery disease involving native coronary artery of native heart with unstable angina pectoris (HCC)   4. Persistent atrial fibrillation (HCC)      PLAN:  In order of problems listed above: Chronic combined systolic and diastolic CHF ejection fraction 25% and difficult to diurese as an outpatient prompting another hospitalization. Suspect his atrial fibrillation is contributing to his CHF and Dr.Koneswaran recommends cardioversion once he's stable from CHF standpoint. Discharge weight 183 pounds. 189 pounds on our scales which is down 9 pounds since last office visit. Heart failure is well compensated. Potassium was 2.9 at discharge. Needs to be rechecked.  Ischemic cardiomyopathy ejection fraction 25% echoes 05/23/17 lower than previously at 30-35%.  CAD status post CABG 3 05/09/17, really slow to recover with several admissions for heart failure and not tolerating atrial fibrillation.  Persistent atrial fibrillation on amiodarone and Eliquis plan is for DCCV once CHF stable patient hasn't used any doses of Eliquis. Plan for cardioversion tomorrow if potassium is normal. Check bmet today. Follow-up with me in 1-2 weeks. Patient hoping to return to [place] on by October 11.    Medication Adjustments/Labs and Tests Ordered: Current medicines are reviewed at length with the patient  today.  Concerns regarding medicines are outlined above.  Medication changes, Labs and Tests ordered today are listed in the Patient Instructions below. Patient Instructions  Medication Instructions:  Your physician recommends that you continue on your current medications as directed. Please refer to the Current Medication list given to you today.   Labwork: Your physician recommends that you return for lab work in: NOW    Testing/Procedures: Your physician has recommended that you have a Cardioversion (DCCV). Electrical Cardioversion uses a jolt of electricity to your heart either through paddles or wired patches attached to your chest. This is a controlled, usually prescheduled, procedure. Defibrillation is   done under light anesthesia in the hospital, and you usually go home the day of the procedure. This is done to get your heart back into a normal rhythm. You are not awake for the procedure. Please see the instruction sheet given to you today.    Follow-Up: Your physician recommends that you schedule a follow-up appointment in 2 Weeks after your Cardioversion.    Any Other Special Instructions Will Be Listed Below (If Applicable). Thank you for choosing [facility]!      If you need a refill on your cardiac medications before your next appointment, please call your pharmacy.      Signed, Melena Hayes, PA-C  06/20/2017 2:53 PM    [facility] [address], [place], [place]  [address] Phone: [phone]; Fax: [phone]    

## 2017-06-21 NOTE — Discharge Instructions (Signed)
Electrical Cardioversion, Care After °This sheet gives you information about how to care for yourself after your procedure. Your health care provider may also give you more specific instructions. If you have problems or questions, contact your health care provider. °What can I expect after the procedure? °After the procedure, it is common to have: °· Some redness on the skin where the shocks were given. ° °Follow these instructions at home: °· Do not drive for 24 hours if you were given a medicine to help you relax (sedative). °· Take over-the-counter and prescription medicines only as told by your health care provider. °· Ask your health care provider how to check your pulse. Check it often. °· Rest for 48 hours after the procedure or as told by your health care provider. °· Avoid or limit your caffeine use as told by your health care provider. °Contact a health care provider if: °· You feel like your heart is beating too quickly or your pulse is not regular. °· You have a serious muscle cramp that does not go away. °Get help right away if: °· You have discomfort in your chest. °· You are dizzy or you feel faint. °· You have trouble breathing or you are short of breath. °· Your speech is slurred. °· You have trouble moving an arm or leg on one side of your body. °· Your fingers or toes turn cold or blue. °This information is not intended to replace advice given to you by your health care provider. Make sure you discuss any questions you have with your health care provider. °Document Released: 07/16/2013 Document Revised: 04/28/2016 Document Reviewed: 03/31/2016 °Elsevier Interactive Patient Education © 2018 [employer] ° °

## 2017-06-21 NOTE — Telephone Encounter (Signed)
-----   Message from Laqueta Linden, MD sent at 06/21/2017 12:13 PM EDT ----- Regarding: reduce Coreg today I just did a cardioversion. Please have him reduce Coreg to 3.125 mg bid. Thx.

## 2017-06-21 NOTE — Anesthesia Preprocedure Evaluation (Signed)
Anesthesia Evaluation  Patient identified by MRN, date of birth, ID band Patient awake    Reviewed: Allergy & Precautions, NPO status , Patient's Chart, lab work & pertinent test results  Airway Mallampati: II  TM Distance: >3 FB Neck ROM: Full   Comment: Prominent upper teeth.  Dental  (+) Teeth Intact, Dental Advisory Given   Pulmonary  Hx acute respiratory failure with hypoxia    breath sounds clear to auscultation       Cardiovascular hypertension, Pt. on medications + angina + CAD, + Past MI, + CABG, + Peripheral Vascular Disease and +CHF  negative cardio ROS  + dysrhythmias Atrial Fibrillation  Rhythm:Regular Rate:Normal     Neuro/Psych negative neurological ROS     GI/Hepatic negative GI ROS, Neg liver ROS,   Endo/Other  Hypothyroidism   Renal/GU negative Renal ROS     Musculoskeletal negative musculoskeletal ROS (+)   Abdominal   Peds  Hematology negative hematology ROS (+)   Anesthesia Other Findings Day of surgery medications reviewed with the patient.  Reproductive/Obstetrics                             Anesthesia Physical Anesthesia Plan  ASA: III  Anesthesia Plan: MAC   Post-op Pain Management:    Induction: Intravenous  PONV Risk Score and Plan:   Airway Management Planned: Simple Face Mask  Additional Equipment:   Intra-op Plan:   Post-operative Plan:   Informed Consent: I have reviewed the patients History and Physical, chart, labs and discussed the procedure including the risks, benefits and alternatives for the proposed anesthesia with the patient or authorized representative who has indicated his/her understanding and acceptance.     Plan Discussed with:   Anesthesia Plan Comments:         Anesthesia Quick Evaluation

## 2017-06-21 NOTE — Telephone Encounter (Signed)
Dr Purvis Sheffield says pt aware per PACU nurse, e-scribed Coreg to [facility] in [place], [place] for pt

## 2017-06-21 NOTE — Transfer of Care (Signed)
Immediate Anesthesia Transfer of Care Note  Patient: Scott [name]  Procedure(s) Performed: Procedure(s) with comments: CARDIOVERSION (N/A) - per office, new potassium level 3.3, per Dr. Patsey Berthold ok to book  Patient Location: PACU  Anesthesia Type:MAC  Level of Consciousness: awake, alert , oriented and patient cooperative  Airway & Oxygen Therapy: Patient Spontanous Breathing and Patient connected to nasal cannula oxygen  Post-op Assessment: Report given to RN and Post -op Vital signs reviewed and stable  Post vital signs: Reviewed and stable  Last Vitals:  Vitals:   06/21/17 1031  Temp: 37.1 C  SpO2: 95%    Last Pain:  Vitals:   06/21/17 1031  TempSrc: Oral         Complications: No apparent anesthesia complications

## 2017-06-21 NOTE — Telephone Encounter (Signed)
Pt and wife notified of lab work and decrease in Coreg to 3.125 mg two times daily.

## 2017-06-21 NOTE — Progress Notes (Signed)
Electrical Cardioversion Procedure Note Bexton Charron [id number] [birth year]  Procedure: Electrical Cardioversion Indications:  Atrial Fibrillation  Procedure Details Consent: Risks of procedure as well as the alternatives and risks of each were explained to the (patient/caregiver).  Consent for procedure obtained. Time Out: Verified patient identification, verified procedure, site/side was marked, verified correct patient position, special equipment/implants available, medications/allergies/relevent history reviewed, required imaging and test results available.  Performed  Patient placed on cardiac monitor, pulse oximetry, supplemental oxygen as necessary.  Sedation given: propofol Pacer pads placed anterior and posterior chest.  Cardioverted 1 time(s).  Cardioverted at 120J.  Evaluation Findings: Post procedure EKG shows: NSR Complications: None Patient did tolerate procedure well.   Ramiro Harvest 06/21/2017, 12:40 PM

## 2017-06-21 NOTE — Anesthesia Postprocedure Evaluation (Signed)
Anesthesia Post Note  Patient: Laine Weldon  Procedure(s) Performed: Procedure(s) (LRB): CARDIOVERSION (N/A)  Patient location during evaluation: PACU Anesthesia Type: MAC Level of consciousness: awake and alert, oriented and patient cooperative Pain management: pain level controlled Respiratory status: spontaneous breathing, nonlabored ventilation, respiratory function stable and patient connected to nasal cannula oxygen Cardiovascular status: blood pressure returned to baseline and stable Postop Assessment: no headache, no apparent nausea or vomiting and adequate PO intake Anesthetic complications: no     Last Vitals:  Vitals:   06/21/17 1031  Temp: 37.1 C  SpO2: 95%    Last Pain:  Vitals:   06/21/17 1031  TempSrc: Oral                 Nashayla Telleria

## 2017-06-21 NOTE — Procedures (Addendum)
Elective direct current cardioversion  Indication: Persistent atrial fibrillation  Description of procedure: After informed consent was obtained and preprocedure evaluation by Anesthesia, patient was taken to the procedure room. Timeout performed. Sedation was managed completely by the Anesthesia service, please refer to their documentation for details. Anterior and posterior chest pads were placed and connected to a biphasic defibrillator. A single synchronized shock of 120J was delivered resulting in restoration of sinus rhythm. The patient remained hemodynamically stable throughout, and there were no immediate complications noted. Follow-up ECG obtained.  I will maintain amiodarone for now and reduce Coreg to 3.125 mg BID.  Prentice Docker, M.D., F.A.C.C.

## 2017-06-21 NOTE — Interval H&P Note (Signed)
History and Physical Interval Note: No changes. Proceed with cardioversion as planned.  06/21/2017 10:16 AM  Scott [name]  has presented today for surgery, with the diagnosis of a-fib  The various methods of treatment have been discussed with the patient and family. After consideration of risks, benefits and other options for treatment, the patient has consented to  Procedure(s) with comments: CARDIOVERSION (N/A) - per office, new potassium level 3.3, per Dr. Jayme Cloud ok to book as a surgical intervention .  The patient's history has been reviewed, patient examined, no change in status, stable for surgery.  I have reviewed the patient's chart and labs.  Questions were answered to the patient's satisfaction.     Prentice Docker

## 2017-06-21 NOTE — Telephone Encounter (Signed)
-----   Message from Dyann Kief, PA-C sent at 06/21/2017 10:41 AM EDT ----- Already taken care of. For DCCV today. bmet next week

## 2017-06-22 ENCOUNTER — Encounter (HOSPITAL_COMMUNITY): Payer: Self-pay | Admitting: Cardiovascular Disease

## 2017-06-22 ENCOUNTER — Telehealth: Payer: Self-pay | Admitting: Physician Assistant

## 2017-06-22 NOTE — Telephone Encounter (Signed)
I spoke with with wife and she will adjust meds

## 2017-06-22 NOTE — Telephone Encounter (Signed)
Increase to 40 mg BID x 2 days and follow symptoms and weight.

## 2017-06-22 NOTE — Telephone Encounter (Signed)
Wife states pt's weight today was 187 lbs, weights this past week were: Monday 184.8 lbs, Tues 183,Wed 183, Thur 185,   Wt at OV on 06/20/17 189 lbs, he is taking Torsemide 20 mg BID   Wife reports he does have some SOB, right foot was slightly swollen today

## 2017-06-22 NOTE — Telephone Encounter (Signed)
Per phone call from pt's wife--weight was up this morning to 187lb

## 2017-06-26 ENCOUNTER — Ambulatory Visit (INDEPENDENT_AMBULATORY_CARE_PROVIDER_SITE_OTHER): Payer: Self-pay | Admitting: Thoracic Surgery (Cardiothoracic Vascular Surgery)

## 2017-06-26 ENCOUNTER — Encounter: Payer: Self-pay | Admitting: Thoracic Surgery (Cardiothoracic Vascular Surgery)

## 2017-06-26 VITALS — BP 104/58 | HR 64 | Resp 16 | Ht 70.0 in | Wt 189.0 lb

## 2017-06-26 DIAGNOSIS — Z951 Presence of aortocoronary bypass graft: Secondary | ICD-10-CM

## 2017-06-26 DIAGNOSIS — I5042 Chronic combined systolic (congestive) and diastolic (congestive) heart failure: Secondary | ICD-10-CM

## 2017-06-26 NOTE — Progress Notes (Signed)
[address]       [place] [address]             [phone]    HPI: Mr. Cuen returns for a scheduled follow-up visit  He is an 83 year old man who was visiting family in [place] when he developed dyspnea. He ruled in for an MI with a troponin of 37.5. His cardiac history dates back to an MI 30 years ago. His preop EF was 25. He underwent coronary bypass grafting 3 on 05/09/2017.  His postoperative course was complicated by atrial fibrillation. While in atrial fibrillation, he had a lot of issues with congestive heart failure. I last saw him in the office on 06/12/2017 and he was having a lot of shortness of breath and swelling in his legs. He saw cardiology on 06/13/2017 and underwent a successful cardioversion on 06/15/2017. Since then has shortness of breath is improved. His weight still fluctuates and his Lasix was increased back to 40 mg twice a day last week. His weight has been stable since then. His exercise tolerance is improving.  He does not have any postoperative pain.  Past Medical History:  Diagnosis Date  . Angina pectoris (HCC)   . Chronic combined systolic and diastolic heart failure, NYHA class 2 (HCC) 2014   EF reportedly anywhere from 25-40%  . Coronary artery disease involving native coronary artery    Reportedly multivessel  . Essential hypertension   . Gout   . History of MI (myocardial infarction)   . Hyperlipemia   . Hyperlipidemia   . Hypothyroidism   . Ischemic dilated cardiomyopathy (HCC)   . Multi-vessel coronary artery stenosis   . Varicose veins of both lower extremities      Current Outpatient Prescriptions  Medication Sig Dispense Refill  . acetaminophen (TYLENOL) 500 MG tablet Take 2 tablets (1,000 mg total) by mouth every 6 (six) hours as needed. 100 tablet 2  . allopurinol (ZYLOPRIM) 100 MG tablet Take 1 tablet (100 mg total) by mouth daily. 30 tablet 1  . amiodarone (PACERONE) 200 MG tablet Take 1 tablet  (200 mg total) by mouth daily. 30 tablet 11  . apixaban (ELIQUIS) 5 MG TABS tablet Take 1 tablet (5 mg total) by mouth 2 (two) times daily. 60 tablet 3  . aspirin 81 MG chewable tablet Chew 81 mg by mouth daily.    Marland Kitchen CALCIUM CARBONATE PO Take 600 mg by mouth 2 (two) times daily.    . carvedilol (COREG) 3.125 MG tablet Take 1 tablet (3.125 mg total) by mouth 2 (two) times daily. 180 tablet 3  . Cholecalciferol (VITAMIN D-1000 MAX ST) 1000 units tablet Take 2,000 Units by mouth every morning.    . cyanocobalamin (TH VITAMIN B12) 100 MCG tablet Take 1 tablet by mouth daily. Take 1 tablet by mouth in the evening.    . fluticasone (CUTIVATE) 0.05 % cream Apply topically 2 (two) times daily.    . isosorbide mononitrate (IMDUR) 30 MG 24 hr tablet Take 30 mg by mouth daily.    Marland Kitchen levothyroxine (SYNTHROID, LEVOTHROID) 150 MCG tablet Take 150 mcg by mouth daily before breakfast.    . Lutein 20 MG CAPS Take 1 capsule by mouth every morning.    . Multiple Vitamin (MULTIVITAMIN) tablet Take 1 tablet by mouth daily.    . nitroGLYCERIN (NITROSTAT) 0.4 MG SL tablet Place 0.4 mg under the tongue every 5 (five) minutes as needed for chest pain.    Marland Kitchen  Potassium Chloride ER 20 MEQ TBCR Take 20 mEq by mouth daily. 30 tablet 3  . pravastatin (PRAVACHOL) 40 MG tablet Take 40 mg by mouth at bedtime.    . torsemide (DEMADEX) 20 MG tablet Take 1 tablet (20 mg total) by mouth 2 (two) times daily. 180 tablet 3  . traMADol (ULTRAM) 50 MG tablet Take 1 tablet (50 mg total) by mouth every 6 (six) hours as needed for moderate pain. 30 tablet 0  . Ubiquinol 100 MG CAPS Take 1 capsule by mouth every morning.     No current facility-administered medications for this visit.     Physical Exam 83 year old man in no acute distress Alert and oriented 3 with no focal deficits Lungs diminished at bases, otherwise clear Cardiac regular rate and rhythm normal S1 and S2 2/6 systolic murmur Sternum stable, incision healing well Trace  peripheral edema   Impression: Mr. Boen is an 83 year old gentleman from [place] who was visiting [place] when he had sudden onset of dyspnea shortly after arriving. He ruled in for a myocardial infarction with a troponin of 37. He had severe three-vessel coronary disease and severe left ventricular dysfunction with ejection fraction of 25%. He underwent coronary bypass grafting 3 on 05/09/2017.  Coronary artery disease- status post coronary bypass grafting 3. No anginal symptoms at present.  Ischemic cardiomyopathy- status post revascularization. Congestive heart failure improved after cardioversion.  Atrial fibrillation- postoperative. Converted to sinus rhythm with cardioversion. His rhythm is regular today. He is on apixaban.  Plan: Mr. Dimmick seems to have "turned the corner" at this point. He is about 6 weeks out from surgery which is usually when that occurs. \ He currently is in sinus rhythm after cardioversion. He has a follow-up appointment with Dr. Purvis Sheffield in a couple of weeks.  He is not having any significant postoperative pain.  He may resume normal activities. There is no restriction from a surgical standpoint for him to return home. He may do so when cleared by cardiology.  Loreli Slot, MD [facility] [phone]

## 2017-06-28 ENCOUNTER — Other Ambulatory Visit (HOSPITAL_COMMUNITY)
Admission: RE | Admit: 2017-06-28 | Discharge: 2017-06-28 | Disposition: A | Discharge status: Home / Self Care | Payer: Medicare (Managed Care) | Source: Ambulatory Visit | Attending: Physician Assistant | Admitting: Physician Assistant

## 2017-06-28 DIAGNOSIS — I509 Heart failure, unspecified: Secondary | ICD-10-CM | POA: Insufficient documentation

## 2017-06-28 LAB — BASIC METABOLIC PANEL
ANION GAP: 9 (ref 5–15)
BUN: 15 mg/dL (ref 6–20)
CALCIUM: 8.7 mg/dL — AB (ref 8.9–10.3)
CO2: 29 mmol/L (ref 22–32)
Chloride: 98 mmol/L — ABNORMAL LOW (ref 101–111)
Creatinine, Ser: 1.35 mg/dL — ABNORMAL HIGH (ref 0.61–1.24)
GFR, EST AFRICAN AMERICAN: 52 mL/min — AB (ref >60)
GFR, EST NON AFRICAN AMERICAN: 45 mL/min — AB (ref >60)
Glucose, Bld: 118 mg/dL — ABNORMAL HIGH (ref 65–99)
Potassium: 3.8 mmol/L (ref 3.5–5.1)
Sodium: 136 mmol/L (ref 135–145)

## 2017-07-04 ENCOUNTER — Encounter: Payer: Self-pay | Admitting: Physician Assistant

## 2017-07-04 ENCOUNTER — Ambulatory Visit (INDEPENDENT_AMBULATORY_CARE_PROVIDER_SITE_OTHER): Payer: Medicare (Managed Care) | Admitting: Physician Assistant

## 2017-07-04 VITALS — BP 116/68 | HR 66 | Ht 70.0 in | Wt 187.0 lb

## 2017-07-04 DIAGNOSIS — I420 Dilated cardiomyopathy: Secondary | ICD-10-CM | POA: Diagnosis not present

## 2017-07-04 DIAGNOSIS — I481 Persistent atrial fibrillation: Secondary | ICD-10-CM

## 2017-07-04 DIAGNOSIS — I2510 Atherosclerotic heart disease of native coronary artery without angina pectoris: Secondary | ICD-10-CM | POA: Diagnosis not present

## 2017-07-04 DIAGNOSIS — I255 Ischemic cardiomyopathy: Secondary | ICD-10-CM

## 2017-07-04 DIAGNOSIS — I5042 Chronic combined systolic (congestive) and diastolic (congestive) heart failure: Secondary | ICD-10-CM

## 2017-07-04 DIAGNOSIS — I4819 Other persistent atrial fibrillation: Secondary | ICD-10-CM

## 2017-07-04 NOTE — Progress Notes (Deleted)
Cardiology Office Note    Date:  07/04/2017   ID:  Scott [name], DOB [birth year], MRN [id number]  PCP:  System, Pcp Not In  Cardiologist: will be seeing Dr. Whitney Post, [place] / Dr. Purvis Sheffield while here   Chief Complaint  Patient presents with  . Follow-up    History of Present Illness:  Scott [name] is a 83 y.o. male with known CAD. He suffered in MI in 1987 with subsequent PCI with stent placement in 1999 and 2013 or 2014. He also has a history of CHF, OSA, Hypertension, Hypothyroidism. He is here visiting from [place]. 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 He underwent CABG x 3 utilizing LIMA to OM1, Free RIMA to Ramus Intermediate, and Right radial artery to PDA. Postop had bradycardia so Coreg was stopped then developed atrial fibrillation and was placed on amiodarone.   Patient was discharge 05/13/17. [facility] with acute respiratory failure with hypoxia secondary to CHF and question of pneumonia. BNP was over 900. He may gotten extra salt in his diet. His creatinine began to increase and BP was low so ACE inhibitor was held. Ultimate plan for outpatient DC CV once his volume is stabilized. 2-D echo 05/23/17 estimated EF 25% with moderate aortic stenosis. See report below for details.Crt 1.47 at discharge.   I saw the patient 06/13/17 and he was very short of breath. His weight was up 4 pounds, he had trouble walking and was having orthopnea. Dr. Purvis Sheffield recommended changing his Lasix to torsemide 20 mg twice a day and adding metolazone only if he didn't improve by Friday. It was also felt that he might improve if he had a cardioversion which was a plan once he was no longer orthopneic.   He went to the emergency room 06/15/17 for worsening dyspnea and no improvement. He took the metolazone morning but didn't start urinating until after he got to the  emergency room. He was desaturating in the 80s while standing at the bedside. He diuresed 4.2 L and creatinine was 1.5 at discharge. He was sent home on Demadex 20 mg twice a day. Discharge weight 183 pounds down 6 pounds from admission weight it 189 pounds. Our scales weigh him at 198 pounds 06/13/17.   Patient underwent successful cardioversion 06/21/17. Amiodarone was continued on Coreg was reduced to 3.125 mg twice a day. Following day he called in with increased shortness of breath and Lasix was increased to 2 days. He saw Dr. Dorris Fetch 06/26/17 who felt that the patient turned the corner and he could travel back to [place] at any time.    Past Medical History:  Diagnosis Date  . Angina pectoris (HCC)   . Chronic combined systolic and diastolic heart failure, NYHA class 2 (HCC) 2014   EF reportedly anywhere from 25-40%  . Coronary artery disease involving native coronary artery    Reportedly multivessel  . Essential hypertension   . Gout   . History of MI (myocardial infarction)   . Hyperlipemia   . Hyperlipidemia   . Hypothyroidism   . Ischemic dilated cardiomyopathy (HCC)   . Multi-vessel coronary artery stenosis   . Varicose veins of both lower extremities     Past Surgical History:  Procedure Laterality Date  . CARDIOVERSION N/A 06/21/2017   Procedure: CARDIOVERSION;  Surgeon: Laqueta Linden, MD;  Location: [facility];  Service: Cardiovascular;  Laterality: N/A;  per office, new potassium level 3.3, per Dr. Jayme Cloud ok to book  . CHOLECYSTECTOMY    . CORONARY ARTERY BYPASS GRAFT N/A 05/09/2017   Procedure: CORONARY ARTERY BYPASS GRAFTING (CABG) times 3 using the right radial artery and bilateral internal mammary arteries.;  Surgeon: Loreli Slot, MD;  Location: [facility] OR;  Service: Open Heart Surgery;  Laterality: N/A;  . LEFT HEART CATH AND CORONARY ANGIOGRAPHY N/A 04/30/2017   Procedure: Left Heart Cath and Coronary Angiography;  Surgeon: Runell Gess, MD;  Location:  [facility] INVASIVE CV LAB;  Service: Cardiovascular;  Laterality: N/A;  . RADIAL ARTERY HARVEST Right 05/09/2017   Procedure: RADIAL ARTERY HARVEST;  Surgeon: Loreli Slot, MD;  Location: [facility] OR;  Service: Open Heart Surgery;  Laterality: Right;  . TEE WITHOUT CARDIOVERSION N/A 05/04/2017   Procedure: TRANSESOPHAGEAL ECHOCARDIOGRAM (TEE);  Surgeon: Quintella Reichert, MD;  Location: [facility] ENDOSCOPY;  Service: Cardiovascular;  Laterality: N/A;  . TEE WITHOUT CARDIOVERSION N/A 05/09/2017   Procedure: TRANSESOPHAGEAL ECHOCARDIOGRAM (TEE);  Surgeon: Loreli Slot, MD;  Location: [facility] OR;  Service: Open Heart Surgery;  Laterality: N/A;  . VASCULAR SURGERY     Vein stripping    Current Medications: Current Meds  Medication Sig  . acetaminophen (TYLENOL) 500 MG tablet Take 2 tablets (1,000 mg total) by mouth every 6 (six) hours as needed.  Marland Kitchen allopurinol (ZYLOPRIM) 100 MG tablet Take 1 tablet (100 mg total) by mouth daily.  Marland Kitchen amiodarone (PACERONE) 200 MG tablet Take 1 tablet (200 mg total) by mouth daily.  Marland Kitchen apixaban (ELIQUIS) 5 MG TABS tablet Take 1 tablet (5 mg total) by mouth 2 (two) times daily.  Marland Kitchen aspirin 81 MG chewable tablet Chew 81 mg by mouth daily.  Marland Kitchen CALCIUM CARBONATE PO Take 600 mg by mouth 2 (two) times daily.  . carvedilol (COREG) 3.125 MG tablet Take 1 tablet (3.125 mg total) by mouth 2 (two) times daily.  . Cholecalciferol (VITAMIN D-1000 MAX ST) 1000 units tablet Take 2,000 Units by mouth every morning.  . cyanocobalamin (TH VITAMIN B12) 100 MCG tablet Take 1 tablet by mouth daily. Take 1 tablet by mouth in the evening.  . fluticasone (CUTIVATE) 0.05 % cream Apply topically 2 (two) times daily.  . isosorbide mononitrate (IMDUR) 30 MG 24 hr tablet Take 30 mg by mouth daily.  Marland Kitchen levothyroxine (SYNTHROID, LEVOTHROID) 150 MCG tablet Take 150 mcg by mouth daily before breakfast.  . Lutein 20 MG CAPS Take 1 capsule by mouth every morning.  . Multiple Vitamin (MULTIVITAMIN) tablet Take 1  tablet by mouth daily.  . nitroGLYCERIN (NITROSTAT) 0.4 MG SL tablet Place 0.4 mg under the tongue every 5 (five) minutes as needed for chest pain.  Marland Kitchen Potassium Chloride ER 20 MEQ TBCR Take 20 mEq by mouth daily.  . pravastatin (PRAVACHOL) 40 MG tablet Take 40 mg by mouth at bedtime.  . torsemide (DEMADEX) 20 MG tablet Take 1 tablet (20 mg total) by mouth 2 (two) times daily.  . traMADol (ULTRAM) 50 MG tablet Take 1 tablet (50 mg total) by mouth every 6 (six) hours as needed for moderate pain.  Marland Kitchen Ubiquinol 100 MG CAPS Take 1 capsule by mouth every morning.     Allergies:   Nifedipine and Rabeprazole sodium   Social History   Social History  . Marital status: Married    Spouse name: N/A  . Number of children: 3  . Years of education: N/A   Social History Main Topics  .  Smoking status: Never Smoker  . Smokeless tobacco: Never Used  . Alcohol use Yes     Comment: Social  . Drug use: No  . Sexual activity: No   Other Topics Concern  . None   Social History Narrative   Remarried, with current wife 1 year. Has 3 children with his former wife. Visiting from [place].     Family History:  The patient's family history includes Bladder Cancer in his brother and father; Heart disease in his mother; Hypertension in his son.   ROS:   Please see the history of present illness.    Review of Systems  Constitution: Negative.  HENT: Negative.   Cardiovascular: Negative.   Respiratory: Negative.   Endocrine: Negative.   Hematologic/Lymphatic: Negative.   Musculoskeletal: Negative.   Gastrointestinal: Negative.   Genitourinary: Negative.   Neurological: Negative.    All other systems reviewed and are negative.   PHYSICAL EXAM:   VS:  BP 116/68   Pulse 66   Ht  (1.778 m)   Wt 187 lb (84.8 kg)   SpO2 95%   BMI 26.83 kg/m   Physical Exam  GEN: Well nourished, well developed, in no acute distress  HEENT: normal  Neck: no JVD, carotid bruits, or masses Cardiac:RRR; no  murmurs, rubs, or gallops  Respiratory:  clear to auscultation bilaterally, normal work of breathing GI: soft, nontender, nondistended, + BS Ext: without cyanosis, clubbing, or edema, Good distal pulses bilaterally MS: no deformity or atrophy  Skin: warm and dry, no rash Neuro:  Alert and Oriented x 3, Strength and sensation are intact Psych: euthymic mood, full affect  Wt Readings from Last 3 Encounters:  07/04/17 187 lb (84.8 kg)  06/26/17 189 lb (85.7 kg)  06/20/17 189 lb (85.7 kg)      Studies/Labs Reviewed:   EKG:  EKG is*** ordered today.  The ekg ordered today demonstrates ***  Recent Labs: 04/27/2017: TSH 0.713 05/23/2017: Magnesium 2.1 05/24/2017: ALT 21 06/15/2017: B Natriuretic Peptide 976.0; Hemoglobin 12.2; Platelets 211 06/28/2017: BUN 15; Creatinine, Ser 1.35; Potassium 3.8; Sodium 136   Lipid Panel No results found for: CHOL, TRIG, HDL, CHOLHDL, VLDL, LDLCALC, LDLDIRECT  Additional studies/ records that were reviewed today include:    Echo 05/23/17- Study Conclusions   - Left ventricle: RWMA;s hard to see even with definity Septal and   apical akineis mid and basal inferior wall hypokinesis. The   cavity size was moderately dilated. Wall thickness was normal.   The estimated ejection fraction was 25%. Doppler parameters are   consistent with both elevated ventricular end-diastolic filling   pressure and elevated left atrial filling pressure. - Aortic valve: Given low EF overall AS at least moderate and   morphologically significant calcification and thickening with   restricted leaflet motion. Valve area (VTI): 1 cm^2. Valve area   (Vmax): 1.26 cm^2. Valve area (Vmean): 1.01 cm^2. - Left atrium: The atrium was moderately dilated. - Atrial septum: No defect or patent foramen ovale was identified. - Pulmonary arteries: PA peak pressure: 42 mm Hg (S). - Pericardium, extracardiac: A trivial pericardial effusion was   identified. There was a left pleural  effusion.   ASSESSMENT:    1. Chronic combined systolic and diastolic CHF (congestive heart failure) (HCC)   2. Ischemic dilated cardiomyopathy (HCC)   3. Coronary artery disease involving native coronary artery of native heart without angina pectoris   4. Persistent atrial fibrillation (HCC)      PLAN:  In order  of problems listed above:  Chronic combined systolic and diastolic CHF ejection fraction 25% with recurrent hospitalizations requiring IV diureses. Afib was most likely contributing to this but now S/P DCCV. They are compliant with 2 gm Na diet.   Ischemic cardiomyopathy ejection fraction 25% echoes 05/23/17 lower than previously at 30-35%.   CAD status post CABG 3 05/09/17, really slow to recover with several admissions for heart failure and didn't tolerate atrial fibrillation.   Persistent atrial fibrillation on amiodarone and Eliquis S/P DCCV  06/21/17 maintained on Amiodarone and Coreg reduced to 3.125 mg BID       Medication Adjustments/Labs and Tests Ordered: Current medicines are reviewed at length with the patient today.  Concerns regarding medicines are outlined above.  Medication changes, Labs and Tests ordered today are listed in the Patient Instructions below. Patient Instructions  Medication Instructions:  Your physician recommends that you continue on your current medications as directed. Please refer to the Current Medication list given to you today.   Labwork: NONE   Testing/Procedures: Your physician has requested that you have an echocardiogram. Echocardiography is a painless test that uses sound waves to create images of your heart. It provides your doctor with information about the size and shape of your heart and how well your heart's chambers and valves are working. This procedure takes approximately one hour. There are no restrictions for this procedure.    Follow-Up: Your physician recommends that you schedule a follow-up appointment with Dr.  Purvis Sheffield   Any Other Special Instructions Will Be Listed Below (If Applicable).     If you need a refill on your cardiac medications before your next appointment, please call your pharmacy. Thank you for choosing [facility]!       Elson Clan, PA-C  07/04/2017 2:30 PM    [facility] [address] [place], [place], [place]  [address] Phone: [phone]; Fax: [phone]

## 2017-07-04 NOTE — Progress Notes (Deleted)
Date:  07/04/2017   ID:  Scott [name], DOB 12/03/1926, MRN [id number]  PCP:  System, Pcp Not In  Cardiologist: will be seeing Dr. Whitney Post, [place] / Dr. Purvis Sheffield while here   No chief complaint on file.   History of Present Illness:  Scott [name] is a 83 y.o. male with known CAD. He suffered in MI in 1987 with subsequent PCI with stent placement in 1999 and 2013 or 2014. He also has a history of CHF, OSA, Hypertension, Hypothyroidism. He is here visiting from [place]. 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 He underwent CABG x 3 utilizing LIMA to OM1, Free RIMA to Ramus Intermediate, and Right radial artery to PDA. Postop had bradycardia so Coreg was stopped then developed atrial fibrillation and was placed on amiodarone.   Patient was discharge 05/13/17. [facility] with acute respiratory failure with hypoxia secondary to CHF and question of pneumonia. BNP was over 900. He may gotten extra salt in his diet. His creatinine began to increase and BP was low so ACE inhibitor was held. Ultimate plan for outpatient DC CV once his volume is stabilized. 2-D echo 05/23/17 estimated EF 25% with moderate aortic stenosis. See report below for details.Crt 1.47 at discharge.   I saw the patient 06/13/17 and he was very short of breath. His weight was up 4 pounds, he had trouble walking and was having orthopnea. Dr. Purvis Sheffield recommended changing his Lasix to torsemide 20 mg twice a day and adding metolazone only if he didn't improve by Friday. It was also felt that he might improve if he had a cardioversion which was a plan once he was no longer orthopneic.   He went to the emergency room 06/15/17 for worsening dyspnea and no improvement. He took the metolazone morning but didn't start urinating until after he got to the emergency room. He was desaturating in the 80s while standing  at the bedside. He diuresed 4.2 L and creatinine was 1.5 at discharge. He was sent home on Demadex 20 mg twice a day. Discharge weight 183 pounds down 6 pounds from admission weight it 189 pounds. Our scales weigh him at 198 pounds 06/13/17.   Patient underwent successful cardioversion 06/21/17. Amiodarone was continued on Coreg was reduced to 3.125 mg twice a day. Following day he called in with increased shortness of breath and Lasix was increased to 2 days. He saw Dr. Dorris Fetch 06/26/17 who felt that the patient turned the corner and he could travel back to [place] at any time.    Past Medical History:  Diagnosis Date  . Angina pectoris (HCC)   . Chronic combined systolic and diastolic heart failure, NYHA class 2 (HCC) 2014   EF reportedly anywhere from 25-40%  . Coronary artery disease involving native coronary artery    Reportedly multivessel  . Essential hypertension   . Gout   . History of MI (myocardial infarction)   . Hyperlipemia   . Hyperlipidemia   . Hypothyroidism   . Ischemic dilated cardiomyopathy (HCC)   . Multi-vessel coronary artery stenosis   . Varicose veins of both lower extremities     Past Surgical History:  Procedure Laterality Date  . CARDIOVERSION N/A 06/21/2017   Procedure: CARDIOVERSION;  Surgeon: Laqueta Linden, MD;  Location: [facility];  Service: Cardiovascular;  Laterality: N/A;  per office, new potassium level 3.3, per Dr. Jayme Cloud  ok to book  . CHOLECYSTECTOMY    . CORONARY ARTERY BYPASS GRAFT N/A 05/09/2017   Procedure: CORONARY ARTERY BYPASS GRAFTING (CABG) times 3 using the right radial artery and bilateral internal mammary arteries.;  Surgeon: Loreli Slot, MD;  Location: [facility] OR;  Service: Open Heart Surgery;  Laterality: N/A;  . LEFT HEART CATH AND CORONARY ANGIOGRAPHY N/A 04/30/2017   Procedure: Left Heart Cath and Coronary Angiography;  Surgeon: Runell Gess, MD;  Location[facility] INVASIVE CV LAB;  Service: Cardiovascular;  Laterality: N/A;   . RADIAL ARTERY HARVEST Right 05/09/2017   Procedure: RADIAL ARTERY HARVEST;  Surgeon: Loreli Slot, MD;  Location: [facility] OR;  Service: Open Heart Surgery;  Laterality: Right;  . TEE WITHOUT CARDIOVERSION N/A 05/04/2017   Procedure: TRANSESOPHAGEAL ECHOCARDIOGRAM (TEE);  Surgeon: Quintella Reichert, MD;  Location: [facility] ENDOSCOPY;  Service: Cardiovascular;  Laterality: N/A;  . TEE WITHOUT CARDIOVERSION N/A 05/09/2017   Procedure: TRANSESOPHAGEAL ECHOCARDIOGRAM (TEE);  Surgeon: Loreli Slot, MD;  Location: [facility] OR;  Service: Open Heart Surgery;  Laterality: N/A;  . VASCULAR SURGERY     Vein stripping    Current Medications: No outpatient prescriptions have been marked as taking for the 07/04/17 encounter (Appointment) with Dyann Kief, PA-C.     Allergies:   Nifedipine and Rabeprazole sodium   Social History   Social History  . Marital status: Married    Spouse name: N/A  . Number of children: 3  . Years of education: N/A   Social History Main Topics  . Smoking status: Never Smoker  . Smokeless tobacco: Never Used  . Alcohol use Yes     Comment: Social  . Drug use: No  . Sexual activity: No   Other Topics Concern  . Not on file   Social History Narrative   Remarried, with current wife 1 year. Has 3 children with his former wife. Visiting from [place].     Family History:  The patient's family history includes Bladder Cancer in his brother and father; Heart disease in his mother; Hypertension in his son.   ROS:   Please see the history of present illness.    Review of Systems  Constitution: Negative.  HENT: Negative.   Cardiovascular: Negative.   Respiratory: Negative.   Endocrine: Negative.   Hematologic/Lymphatic: Negative.   Musculoskeletal: Negative.   Gastrointestinal: Negative.   Genitourinary: Negative.   Neurological: Negative.    All other systems reviewed and are negative.   PHYSICAL EXAM:   VS:  There were no vitals taken for this visit.   Physical Exam  GEN: Well nourished, well developed, in no acute distress  HEENT: normal  Neck: no JVD, carotid bruits, or masses Cardiac:RRR; no murmurs, rubs, or gallops  Respiratory:  clear to auscultation bilaterally, normal work of breathing GI: soft, nontender, nondistended, + BS Ext: without cyanosis, clubbing, or edema, Good distal pulses bilaterally MS: no deformity or atrophy  Skin: warm and dry, no rash Neuro:  Alert and Oriented x 3, Strength and sensation are intact Psych: euthymic mood, full affect  Wt Readings from Last 3 Encounters:  06/26/17 189 lb (85.7 kg)  06/20/17 189 lb (85.7 kg)  06/17/17 183 lb 11.2 oz (83.3 kg)      Studies/Labs Reviewed:   EKG:  EKG is*** ordered today.  The ekg ordered today demonstrates ***  Recent Labs: 04/27/2017: TSH 0.713 05/23/2017: Magnesium 2.1 05/24/2017: ALT 21 06/15/2017: B Natriuretic Peptide 976.0; Hemoglobin 12.2; Platelets 211 06/28/2017: BUN 15;  Creatinine, Ser 1.35; Potassium 3.8; Sodium 136   Lipid Panel No results found for: CHOL, TRIG, HDL, CHOLHDL, VLDL, LDLCALC, LDLDIRECT  Additional studies/ records that were reviewed today include:    Echo 05/23/17- Study Conclusions   - Left ventricle: RWMA;s hard to see even with definity Septal and   apical akineis mid and basal inferior wall hypokinesis. The   cavity size was moderately dilated. Wall thickness was normal.   The estimated ejection fraction was 25%. Doppler parameters are   consistent with both elevated ventricular end-diastolic filling   pressure and elevated left atrial filling pressure. - Aortic valve: Given low EF overall AS at least moderate and   morphologically significant calcification and thickening with   restricted leaflet motion. Valve area (VTI): 1 cm^2. Valve area   (Vmax): 1.26 cm^2. Valve area (Vmean): 1.01 cm^2. - Left atrium: The atrium was moderately dilated. - Atrial septum: No defect or patent foramen ovale was identified. -  Pulmonary arteries: PA peak pressure: 42 mm Hg (S). - Pericardium, extracardiac: A trivial pericardial effusion was   identified. There was a left pleural effusion.   ASSESSMENT:    1. Acute on chronic combined systolic and diastolic CHF (congestive heart failure) (HCC)   2. Ischemic dilated cardiomyopathy (HCC)   3. Coronary artery disease involving native coronary artery of native heart without angina pectoris   4. Persistent atrial fibrillation (HCC)      PLAN:  In order of problems listed above:  Chronic combined systolic and diastolic CHF ejection fraction 25% with recurrent hospitalizations requiring IV diureses. Afib was most likely contributing to this but now S/P DCCV. They are compliant with 2 gm Na diet.   Ischemic cardiomyopathy ejection fraction 25% echoes 05/23/17 lower than previously at 30-35%.   CAD status post CABG 3 05/09/17, really slow to recover with several admissions for heart failure and didn't tolerate atrial fibrillation.   Persistent atrial fibrillation on amiodarone and Eliquis S/P DCCV  06/21/17 maintained on Amiodarone and Coreg reduced to 3.125 mg BID       Medication Adjustments/Labs and Tests Ordered: Current medicines are reviewed at length with the patient today.  Concerns regarding medicines are outlined above.  Medication changes, Labs and Tests ordered today are listed in the Patient Instructions below. There are no Patient Instructions on file for this visit.   Elson Clan, PA-C  07/04/2017 10:22 AM    [facility] [address] [place], [place], [place]  [address] Phone: [phone]; Fax: [phone]

## 2017-07-04 NOTE — Patient Instructions (Signed)
Medication Instructions:  Your physician recommends that you continue on your current medications as directed. Please refer to the Current Medication list given to you today.   Labwork: NONE   Testing/Procedures: Your physician has requested that you have an echocardiogram. Echocardiography is a painless test that uses sound waves to create images of your heart. It provides your doctor with information about the size and shape of your heart and how well your heart's chambers and valves are working. This procedure takes approximately one hour. There are no restrictions for this procedure.    Follow-Up: Your physician recommends that you schedule a follow-up appointment with Dr. Purvis Sheffield   Any Other Special Instructions Will Be Listed Below (If Applicable).     If you need a refill on your cardiac medications before your next appointment, please call your pharmacy. Thank you for choosing [facility]!

## 2017-07-04 NOTE — Progress Notes (Signed)
Cardiology Office Note    Date:  07/04/2017   ID:  Scott [name], DOB 06/18/1927, MRN [id number]  PCP:  System, Pcp Not In  Cardiologist: will be seeing Dr. Whitney Post, [place] / Dr. Purvis Sheffield while here   Chief Complaint  Patient presents with  . Follow-up    History of Present Illness:  Scott [name] is a 83 y.o. male with known CAD. He suffered in MI in 1987 with subsequent PCI with stent placement in 1999 and 2013 or 2014. He also has a history of CHF, OSA, Hypertension, Hypothyroidism. He is here visiting from [place]. 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 He underwent CABG x 3 utilizing LIMA to OM1, Free RIMA to Ramus Intermediate, and Right radial artery to PDA. Postop had bradycardia so Coreg was stopped then developed atrial fibrillation and was placed on amiodarone.   Patient was discharge 05/13/17. [facility] with acute respiratory failure with hypoxia secondary to CHF and question of pneumonia. BNP was over 900. He may gotten extra salt in his diet. His creatinine began to increase and BP was low so ACE inhibitor was held. Ultimate plan for outpatient DC CV once his volume is stabilized. 2-D echo 05/23/17 estimated EF 25% with moderate aortic stenosis. See report below for details.Crt 1.47 at discharge.   I saw the patient 06/13/17 and he was very short of breath. His weight was up 4 pounds, he had trouble walking and was having orthopnea. Dr. Purvis Sheffield recommended changing his Lasix to torsemide 20 mg twice a day and adding metolazone only if he didn't improve by Friday. It was also felt that he might improve if he had a cardioversion which was a plan once he was no longer orthopneic.   He went to the emergency room 06/15/17 for worsening dyspnea and no improvement. He took the metolazone morning but didn't start urinating until after he got to the  emergency room. He was desaturating in the 80s while standing at the bedside. He diuresed 4.2 L and creatinine was 1.5 at discharge. He was sent home on Demadex 20 mg twice a day. Discharge weight 183 pounds down 6 pounds from admission weight it 189 pounds. Our scales weigh him at 198 pounds 06/13/17.   Patient underwent successful cardioversion 06/21/17. Amiodarone was continued on Coreg was reduced to 3.125 mg twice a day. Following day he called in with increased shortness of breath and Lasix was increased to 2 days. He saw Dr. Dorris Fetch 06/26/17 who felt that the patient turned the corner and he could travel back to [place] at any time.  Patient comes in today for follow-up after cardioversion. He feels better than he has in a long time. His breathing has improved greatly and he is walking more. They have a trip to [place] planned tomorrow.    Past Medical History:  Diagnosis Date  . Angina pectoris (HCC)   . Chronic combined systolic and diastolic heart failure, NYHA class 2 (HCC) 2014   EF reportedly anywhere from 25-40%  . Coronary artery disease involving native coronary artery    Reportedly multivessel  . Essential hypertension   . Gout   . History of MI (myocardial infarction)   . Hyperlipemia   . Hyperlipidemia   . Hypothyroidism   . Ischemic dilated cardiomyopathy (HCC)   . Multi-vessel coronary artery stenosis   . Varicose veins  of both lower extremities     Past Surgical History:  Procedure Laterality Date  . CARDIOVERSION N/A 06/21/2017   Procedure: CARDIOVERSION;  Surgeon: Laqueta Linden, MD;  Location: [facility];  Service: Cardiovascular;  Laterality: N/A;  per office, new potassium level 3.3, per Dr. Jayme Cloud ok to book  . CHOLECYSTECTOMY    . CORONARY ARTERY BYPASS GRAFT N/A 05/09/2017   Procedure: CORONARY ARTERY BYPASS GRAFTING (CABG) times 3 using the right radial artery and bilateral internal mammary arteries.;  Surgeon: Loreli Slot, MD;  Location:  [facility] OR;  Service: Open Heart Surgery;  Laterality: N/A;  . LEFT HEART CATH AND CORONARY ANGIOGRAPHY N/A 04/30/2017   Procedure: Left Heart Cath and Coronary Angiography;  Surgeon: Runell Gess, MD;  Location: [facility] INVASIVE CV LAB;  Service: Cardiovascular;  Laterality: N/A;  . RADIAL ARTERY HARVEST Right 05/09/2017   Procedure: RADIAL ARTERY HARVEST;  Surgeon: Loreli Slot, MD;  Location: [facility] OR;  Service: Open Heart Surgery;  Laterality: Right;  . TEE WITHOUT CARDIOVERSION N/A 05/04/2017   Procedure: TRANSESOPHAGEAL ECHOCARDIOGRAM (TEE);  Surgeon: Quintella Reichert, MD;  Location: [facility] ENDOSCOPY;  Service: Cardiovascular;  Laterality: N/A;  . TEE WITHOUT CARDIOVERSION N/A 05/09/2017   Procedure: TRANSESOPHAGEAL ECHOCARDIOGRAM (TEE);  Surgeon: Loreli Slot, MD;  Location: [facility] OR;  Service: Open Heart Surgery;  Laterality: N/A;  . VASCULAR SURGERY     Vein stripping    Current Medications: Current Meds  Medication Sig  . acetaminophen (TYLENOL) 500 MG tablet Take 2 tablets (1,000 mg total) by mouth every 6 (six) hours as needed.  Marland Kitchen allopurinol (ZYLOPRIM) 100 MG tablet Take 1 tablet (100 mg total) by mouth daily.  Marland Kitchen amiodarone (PACERONE) 200 MG tablet Take 1 tablet (200 mg total) by mouth daily.  Marland Kitchen apixaban (ELIQUIS) 5 MG TABS tablet Take 1 tablet (5 mg total) by mouth 2 (two) times daily.  Marland Kitchen aspirin 81 MG chewable tablet Chew 81 mg by mouth daily.  Marland Kitchen CALCIUM CARBONATE PO Take 600 mg by mouth 2 (two) times daily.  . carvedilol (COREG) 3.125 MG tablet Take 1 tablet (3.125 mg total) by mouth 2 (two) times daily.  . Cholecalciferol (VITAMIN D-1000 MAX ST) 1000 units tablet Take 2,000 Units by mouth every morning.  . cyanocobalamin (TH VITAMIN B12) 100 MCG tablet Take 1 tablet by mouth daily. Take 1 tablet by mouth in the evening.  . fluticasone (CUTIVATE) 0.05 % cream Apply topically 2 (two) times daily.  . isosorbide mononitrate (IMDUR) 30 MG 24 hr tablet Take 30 mg by mouth daily.  Marland Kitchen  levothyroxine (SYNTHROID, LEVOTHROID) 150 MCG tablet Take 150 mcg by mouth daily before breakfast.  . Lutein 20 MG CAPS Take 1 capsule by mouth every morning.  . Multiple Vitamin (MULTIVITAMIN) tablet Take 1 tablet by mouth daily.  . nitroGLYCERIN (NITROSTAT) 0.4 MG SL tablet Place 0.4 mg under the tongue every 5 (five) minutes as needed for chest pain.  Marland Kitchen Potassium Chloride ER 20 MEQ TBCR Take 20 mEq by mouth daily.  . pravastatin (PRAVACHOL) 40 MG tablet Take 40 mg by mouth at bedtime.  . torsemide (DEMADEX) 20 MG tablet Take 1 tablet (20 mg total) by mouth 2 (two) times daily.  . traMADol (ULTRAM) 50 MG tablet Take 1 tablet (50 mg total) by mouth every 6 (six) hours as needed for moderate pain.  Marland Kitchen Ubiquinol 100 MG CAPS Take 1 capsule by mouth every morning.     Allergies:   Nifedipine and Rabeprazole  sodium   Social History   Social History  . Marital status: Married    Spouse name: N/A  . Number of children: 3  . Years of education: N/A   Social History Main Topics  . Smoking status: Never Smoker  . Smokeless tobacco: Never Used  . Alcohol use Yes     Comment: Social  . Drug use: No  . Sexual activity: No   Other Topics Concern  . None   Social History Narrative   Remarried, with current wife 1 year. Has 3 children with his former wife. Visiting from [place].     Family History:  The patient's family history includes Bladder Cancer in his brother and father; Heart disease in his mother; Hypertension in his son.   ROS:   Please see the history of present illness.    Review of Systems  Constitution: Negative.  HENT: Negative.   Cardiovascular: Positive for dyspnea on exertion.  Respiratory: Negative.   Endocrine: Negative.   Hematologic/Lymphatic: Negative.   Musculoskeletal: Negative.   Gastrointestinal: Positive for constipation.  Genitourinary: Negative.   Neurological: Negative.    All other systems reviewed and are negative.   PHYSICAL EXAM:   VS:  BP  116/68   Pulse 66   Ht  (1.778 m)   Wt 187 lb (84.8 kg)   SpO2 95%   BMI 26.83 kg/m   Physical Exam  GEN: Well nourished, well developed, in no acute distress  Neck: no JVD, carotid bruits, or masses Cardiac:RRR; no murmurs, rubs, or gallops  Respiratory:  clear to auscultation bilaterally, normal work of breathing GI: soft, nontender, nondistended, + BS Ext: without cyanosis, clubbing, or edema, Good distal pulses bilaterally Neuro:  Alert and Oriented x 3 Psych: euthymic mood, full affect  Wt Readings from Last 3 Encounters:  07/04/17 187 lb (84.8 kg)  06/26/17 189 lb (85.7 kg)  06/20/17 189 lb (85.7 kg)      Studies/Labs Reviewed:   EKG:  EKG is  ordered today.  The ekg ordered today demonstrates Normal sinus rhythm  Recent Labs: 04/27/2017: TSH 0.713 05/23/2017: Magnesium 2.1 05/24/2017: ALT 21 06/15/2017: B Natriuretic Peptide 976.0; Hemoglobin 12.2; Platelets 211 06/28/2017: BUN 15; Creatinine, Ser 1.35; Potassium 3.8; Sodium 136   Lipid Panel No results found for: CHOL, TRIG, HDL, CHOLHDL, VLDL, LDLCALC, LDLDIRECT  Additional studies/ records that were reviewed today include:    Echo 05/23/17- Study Conclusions   - Left ventricle: RWMA;s hard to see even with definity Septal and   apical akineis mid and basal inferior wall hypokinesis. The   cavity size was moderately dilated. Wall thickness was normal.   The estimated ejection fraction was 25%. Doppler parameters are   consistent with both elevated ventricular end-diastolic filling   pressure and elevated left atrial filling pressure. - Aortic valve: Given low EF overall AS at least moderate and   morphologically significant calcification and thickening with   restricted leaflet motion. Valve area (VTI): 1 cm^2. Valve area   (Vmax): 1.26 cm^2. Valve area (Vmean): 1.01 cm^2. - Left atrium: The atrium was moderately dilated. - Atrial septum: No defect or patent foramen ovale was identified. - Pulmonary  arteries: PA peak pressure: 42 mm Hg (S). - Pericardium, extracardiac: A trivial pericardial effusion was   identified. There was a left pleural effusion.   ASSESSMENT:    1. Chronic combined systolic and diastolic CHF (congestive heart failure) (HCC)   2. Ischemic dilated cardiomyopathy (HCC)   3. Coronary artery  disease involving native coronary artery of native heart without angina pectoris   4. Persistent atrial fibrillation (HCC)      PLAN:  In order of problems listed above:  Chronic combined systolic and diastolic CHF ejection fraction 25% with recurrent hospitalizations requiring IV diureses. Afib was most likely contributing to this but now S/P DCCV. They are compliant with 2 gm Na diet. He is well compensated for the first time today. No changes. We'll recheck renal function at follow-up office visit 07/16/17.   Ischemic cardiomyopathy ejection fraction 25% echoes 05/23/17 lower than previously at 30-35%. Will order follow-up echo now that he is in normal sinus rhythm.   CAD status post CABG 3 05/09/17, really slow to recover with several admissions for heart failure and didn't tolerate atrial fibrillation.   Persistent atrial fibrillation on amiodarone and Eliquis S/P DCCV  06/21/17 maintained on Amiodarone and Coreg reduced to 3.125 mg BID. Maintaining normal sinus rhythm today. We'll need thyroid, liver functions and chest x-rays periodically for surveillance on amiodarone.       Medication Adjustments/Labs and Tests Ordered: Current medicines are reviewed at length with the patient today.  Concerns regarding medicines are outlined above.  Medication changes, Labs and Tests ordered today are listed in the Patient Instructions below. Patient Instructions  Medication Instructions:  Your physician recommends that you continue on your current medications as directed. Please refer to the Current Medication list given to you today.   Labwork: NONE   Testing/Procedures: Your  physician has requested that you have an echocardiogram. Echocardiography is a painless test that uses sound waves to create images of your heart. It provides your doctor with information about the size and shape of your heart and how well your heart's chambers and valves are working. This procedure takes approximately one hour. There are no restrictions for this procedure.    Follow-Up: Your physician recommends that you schedule a follow-up appointment with Dr. Purvis Sheffield   Any Other Special Instructions Will Be Listed Below (If Applicable).     If you need a refill on your cardiac medications before your next appointment, please call your pharmacy. Thank you for choosing  [facility]!       Signed, Jacolyn Reedy, PA-C  07/04/2017 2:28 PM    [facility] [address] [place], [place], [place]  [address] Phone: [phone]; Fax: [phone]

## 2017-07-12 ENCOUNTER — Ambulatory Visit (HOSPITAL_COMMUNITY)
Admission: RE | Admit: 2017-07-12 | Discharge: 2017-07-12 | Disposition: A | Discharge status: Home / Self Care | Payer: Medicare (Managed Care) | Source: Ambulatory Visit | Attending: Physician Assistant | Admitting: Physician Assistant

## 2017-07-12 DIAGNOSIS — I5042 Chronic combined systolic (congestive) and diastolic (congestive) heart failure: Secondary | ICD-10-CM

## 2017-07-12 DIAGNOSIS — I481 Persistent atrial fibrillation: Secondary | ICD-10-CM | POA: Diagnosis not present

## 2017-07-12 DIAGNOSIS — I4819 Other persistent atrial fibrillation: Secondary | ICD-10-CM

## 2017-07-12 DIAGNOSIS — I082 Rheumatic disorders of both aortic and tricuspid valves: Secondary | ICD-10-CM | POA: Diagnosis not present

## 2017-07-12 DIAGNOSIS — I110 Hypertensive heart disease with heart failure: Secondary | ICD-10-CM | POA: Insufficient documentation

## 2017-07-12 DIAGNOSIS — I219 Acute myocardial infarction, unspecified: Secondary | ICD-10-CM | POA: Insufficient documentation

## 2017-07-12 MED ORDER — PERFLUTREN LIPID MICROSPHERE
1.0000 mL | INTRAVENOUS | Status: AC | PRN
  Administered 2017-07-12: 1 mL via INTRAVENOUS
  Administered 2017-07-12: 2 mL via INTRAVENOUS
  Filled 2017-07-12: qty 10

## 2017-07-12 NOTE — Progress Notes (Signed)
*  PRELIMINARY RESULTS* Echocardiogram 2D Echocardiogramwith definity has been performed.  Scott [name] 07/12/2017, 3:04 PM

## 2017-07-16 ENCOUNTER — Ambulatory Visit (INDEPENDENT_AMBULATORY_CARE_PROVIDER_SITE_OTHER): Payer: Medicare (Managed Care) | Admitting: Cardiovascular Disease

## 2017-07-16 ENCOUNTER — Encounter: Payer: Self-pay | Admitting: Cardiovascular Disease

## 2017-07-16 VITALS — BP 124/68 | HR 70 | Ht 70.0 in | Wt 183.0 lb

## 2017-07-16 DIAGNOSIS — I481 Persistent atrial fibrillation: Secondary | ICD-10-CM

## 2017-07-16 DIAGNOSIS — I10 Essential (primary) hypertension: Secondary | ICD-10-CM

## 2017-07-16 DIAGNOSIS — E785 Hyperlipidemia, unspecified: Secondary | ICD-10-CM | POA: Diagnosis not present

## 2017-07-16 DIAGNOSIS — I25708 Atherosclerosis of coronary artery bypass graft(s), unspecified, with other forms of angina pectoris: Secondary | ICD-10-CM | POA: Diagnosis not present

## 2017-07-16 DIAGNOSIS — I5042 Chronic combined systolic (congestive) and diastolic (congestive) heart failure: Secondary | ICD-10-CM

## 2017-07-16 DIAGNOSIS — Z951 Presence of aortocoronary bypass graft: Secondary | ICD-10-CM | POA: Diagnosis not present

## 2017-07-16 DIAGNOSIS — I350 Nonrheumatic aortic (valve) stenosis: Secondary | ICD-10-CM

## 2017-07-16 DIAGNOSIS — I4819 Other persistent atrial fibrillation: Secondary | ICD-10-CM

## 2017-07-16 NOTE — Patient Instructions (Signed)
Enjoy your trip home !   We have enjoyed caring for you  !!     Thank you for choosing [facility] !

## 2017-07-16 NOTE — Progress Notes (Signed)
SUBJECTIVE: The patient presents for follow-up of chronic combined systolic and diastolic heart failure with severe left ventricular dysfunction, coronary artery disease with history of 3 vessel CABG, and persistent atrial fibrillation.  He is originally from [place], [place].  Echocardiogram 07/12/17: Moderately reduced left ventricle systolic function, LVEF 30-35%, wall motion abnormalities consistent with coronary artery disease, grade 1 diastolic dysfunction, moderate to severe aortic stenosis, mild biatrial dilatation, mildly reduced right ventricular systolic function, pulmonary pressures 34 mmHg.  Echocardiogram on 05/23/17 demonstrated severely reduced left ventricular systolic function, LVEF 25%.  He is doing very well and denies chest pain, shortness of breath, leg swelling, palpitations, orthopnea, and paroxysmal nocturnal dyspnea.  He is tolerating his medications without difficulty.  He has an appointment with a cardiologist, Dr. Whitney Post with [facility] on 07/30/17 in [place], [place]. He is scheduled to see his PCP on 07/20/17.  He plans to return to [place] for another family reunion in 2 years.  He and his wife are both very appreciative of the care they received while here in [place].   Review of Systems: As per "subjective", otherwise negative.  Allergies  Allergen Reactions  . Nifedipine     UNSPECIFIED REACTION   . Rabeprazole Sodium Nausea Only    Current Outpatient Prescriptions  Medication Sig Dispense Refill  . acetaminophen (TYLENOL) 500 MG tablet Take 2 tablets (1,000 mg total) by mouth every 6 (six) hours as needed. 100 tablet 2  . allopurinol (ZYLOPRIM) 100 MG tablet Take 1 tablet (100 mg total) by mouth daily. 30 tablet 1  . amiodarone (PACERONE) 200 MG tablet Take 1 tablet (200 mg total) by mouth daily. 30 tablet 11  . apixaban (ELIQUIS) 5 MG TABS tablet Take 1 tablet (5 mg total) by mouth 2 (two) times daily. 60 tablet 3  . aspirin 81 MG  chewable tablet Chew 81 mg by mouth daily.    Marland Kitchen CALCIUM CARBONATE PO Take 600 mg by mouth 2 (two) times daily.    . carvedilol (COREG) 3.125 MG tablet Take 1 tablet (3.125 mg total) by mouth 2 (two) times daily. 180 tablet 3  . Cholecalciferol (VITAMIN D-1000 MAX ST) 1000 units tablet Take 2,000 Units by mouth every morning.    . cyanocobalamin (TH VITAMIN B12) 100 MCG tablet Take 1 tablet by mouth daily. Take 1 tablet by mouth in the evening.    . fluticasone (CUTIVATE) 0.05 % cream Apply topically 2 (two) times daily.    . isosorbide mononitrate (IMDUR) 30 MG 24 hr tablet Take 30 mg by mouth daily.    Marland Kitchen levothyroxine (SYNTHROID, LEVOTHROID) 150 MCG tablet Take 150 mcg by mouth daily before breakfast.    . Lutein 20 MG CAPS Take 1 capsule by mouth every morning.    . Multiple Vitamin (MULTIVITAMIN) tablet Take 1 tablet by mouth daily.    . nitroGLYCERIN (NITROSTAT) 0.4 MG SL tablet Place 0.4 mg under the tongue every 5 (five) minutes as needed for chest pain.    Marland Kitchen Potassium Chloride ER 20 MEQ TBCR Take 20 mEq by mouth daily. 30 tablet 3  . pravastatin (PRAVACHOL) 40 MG tablet Take 40 mg by mouth at bedtime.    . torsemide (DEMADEX) 20 MG tablet Take 1 tablet (20 mg total) by mouth 2 (two) times daily. 180 tablet 3  . traMADol (ULTRAM) 50 MG tablet Take 1 tablet (50 mg total) by mouth every 6 (six) hours as needed for moderate pain. 30 tablet 0  .  Ubiquinol 100 MG CAPS Take 1 capsule by mouth every morning.     No current facility-administered medications for this visit.     Past Medical History:  Diagnosis Date  . Angina pectoris (HCC)   . Chronic combined systolic and diastolic heart failure, NYHA class 2 (HCC) 2014   EF reportedly anywhere from 25-40%  . Coronary artery disease involving native coronary artery    Reportedly multivessel  . Essential hypertension   . Gout   . History of MI (myocardial infarction)   . Hyperlipemia   . Hyperlipidemia   . Hypothyroidism   . Ischemic  dilated cardiomyopathy (HCC)   . Multi-vessel coronary artery stenosis   . Varicose veins of both lower extremities     Past Surgical History:  Procedure Laterality Date  . CARDIOVERSION N/A 06/21/2017   Procedure: CARDIOVERSION;  Surgeon: Laqueta Linden, MD;  Location: [facility];  Service: Cardiovascular;  Laterality: N/A;  per office, new potassium level 3.3, per Dr. Jayme Cloud ok to book  . CHOLECYSTECTOMY    . CORONARY ARTERY BYPASS GRAFT N/A 05/09/2017   Procedure: CORONARY ARTERY BYPASS GRAFTING (CABG) times 3 using the right radial artery and bilateral internal mammary arteries.;  Surgeon: Loreli Slot, MD;  Location: [facility] OR;  Service: Open Heart Surgery;  Laterality: N/A;  . LEFT HEART CATH AND CORONARY ANGIOGRAPHY N/A 04/30/2017   Procedure: Left Heart Cath and Coronary Angiography;  Surgeon: Runell Gess, MD;  Location: [facility] INVASIVE CV LAB;  Service: Cardiovascular;  Laterality: N/A;  . RADIAL ARTERY HARVEST Right 05/09/2017   Procedure: RADIAL ARTERY HARVEST;  Surgeon: Loreli Slot, MD;  Location: [facility] OR;  Service: Open Heart Surgery;  Laterality: Right;  . TEE WITHOUT CARDIOVERSION N/A 05/04/2017   Procedure: TRANSESOPHAGEAL ECHOCARDIOGRAM (TEE);  Surgeon: Quintella Reichert, MD;  Location: [facility] ENDOSCOPY;  Service: Cardiovascular;  Laterality: N/A;  . TEE WITHOUT CARDIOVERSION N/A 05/09/2017   Procedure: TRANSESOPHAGEAL ECHOCARDIOGRAM (TEE);  Surgeon: Loreli Slot, MD;  Location[facility] OR;  Service: Open Heart Surgery;  Laterality: N/A;  . VASCULAR SURGERY     Vein stripping    Social History   Social History  . Marital status: Married    Spouse name: N/A  . Number of children: 3  . Years of education: N/A   Occupational History  . Not on file.   Social History Main Topics  . Smoking status: Never Smoker  . Smokeless tobacco: Never Used  . Alcohol use Yes     Comment: Social  . Drug use: No  . Sexual activity: No   Other Topics Concern  . Not on  file   Social History Narrative   Remarried, with current wife 1 year. Has 3 children with his former wife. Visiting from [place].     Vitals:   07/16/17 1257  BP: 124/68  Pulse: 70  SpO2: 95%  Weight: 183 lb (83 kg)  Height: 5\' 10"  (1.778 m)    Wt Readings from Last 3 Encounters:  07/16/17 183 lb (83 kg)  07/04/17 187 lb (84.8 kg)  06/26/17 189 lb (85.7 kg)     PHYSICAL EXAM General: NAD HEENT: Normal. Neck: No JVD, no thyromegaly. Lungs: Clear to auscultation bilaterally with normal respiratory effort. CV: Nondisplaced PMI.  Regular rate and rhythm, normal S1/S2, no S3/S4, 2/6 ejection systolic murmur over right upper sternal border. No pretibial or periankle edema.  No carotid bruit.   Abdomen: Soft, nontender, no distention.  Neurologic: Alert and oriented.  Psych: Normal affect. Skin: Normal. Musculoskeletal: No gross deformities.    ECG: Most recent ECG reviewed.   Labs: Lab Results  Component Value Date/Time   K 3.8 06/28/2017 02:55 PM   BUN 15 06/28/2017 02:55 PM   CREATININE 1.35 (H) 06/28/2017 02:55 PM   ALT 21 05/24/2017 08:00 AM   TSH 0.713 04/27/2017 03:12 PM   HGB 12.2 (L) 06/15/2017 12:38 PM     Lipids: No results found for: LDLCALC, LDLDIRECT, CHOL, TRIG, HDL     ASSESSMENT AND PLAN:  1. Chronic combined systolic and diastolic heart failure: Euvolemic and symptomatically stable on torsemide 20 mg twice daily. Continue carvedilol. LVEF has improved to 30-35%. BUN 15, creatinine 1.35 on 06/28/17. One could consider adding an ACE inhibitor or angiotensin receptor blocker if blood pressure tolerates.  2. Coronary artery disease with non-STEMI and 3 vessel CABG: Symptomatically stable. Continue aspirin, pravastatin, nitrates, and carvedilol.  3. Persistent atrial fibrillation: Continue amiodarone and Eliquis. Will need periodic monitoring of thyroid function, liver function, and PFTs.  4. Hypertension: Controlled. No changes.  5.  Hyperlipidemia: Continue pravastatin 40 mg.  6. Aortic stenosis: Possibly moderate to severe by echocardiogram on 07/12/17. He may have low output low gradient aortic stenosis. This will need to be carefully monitored. He may be a TAVR candidate in the future.     Disposition: Follow up with Dr. Whitney Post in [place], [place], on 07/30/17. Follow up with PCP on 07/20/17. I will provide a letter for upcoming air travel.   Prentice Docker, M.D., F.A.C.C.

## 2017-07-17 ENCOUNTER — Telehealth: Payer: Self-pay | Admitting: Cardiovascular Disease

## 2017-07-17 NOTE — Telephone Encounter (Signed)
Verified potassium dose as 20 meq daily

## 2017-07-17 NOTE — Telephone Encounter (Signed)
Pt's wife has a question about how much potassium he's supposed to be taking

## 2017-10-06 IMAGING — DX DG CHEST 2V
2 series · 2 of 2 positions shown · non-contrast
Comparison: 06/12/2017

CLINICAL DATA: Shortness of breath.

EXAM:
CHEST  2 VIEW

[chest lat]
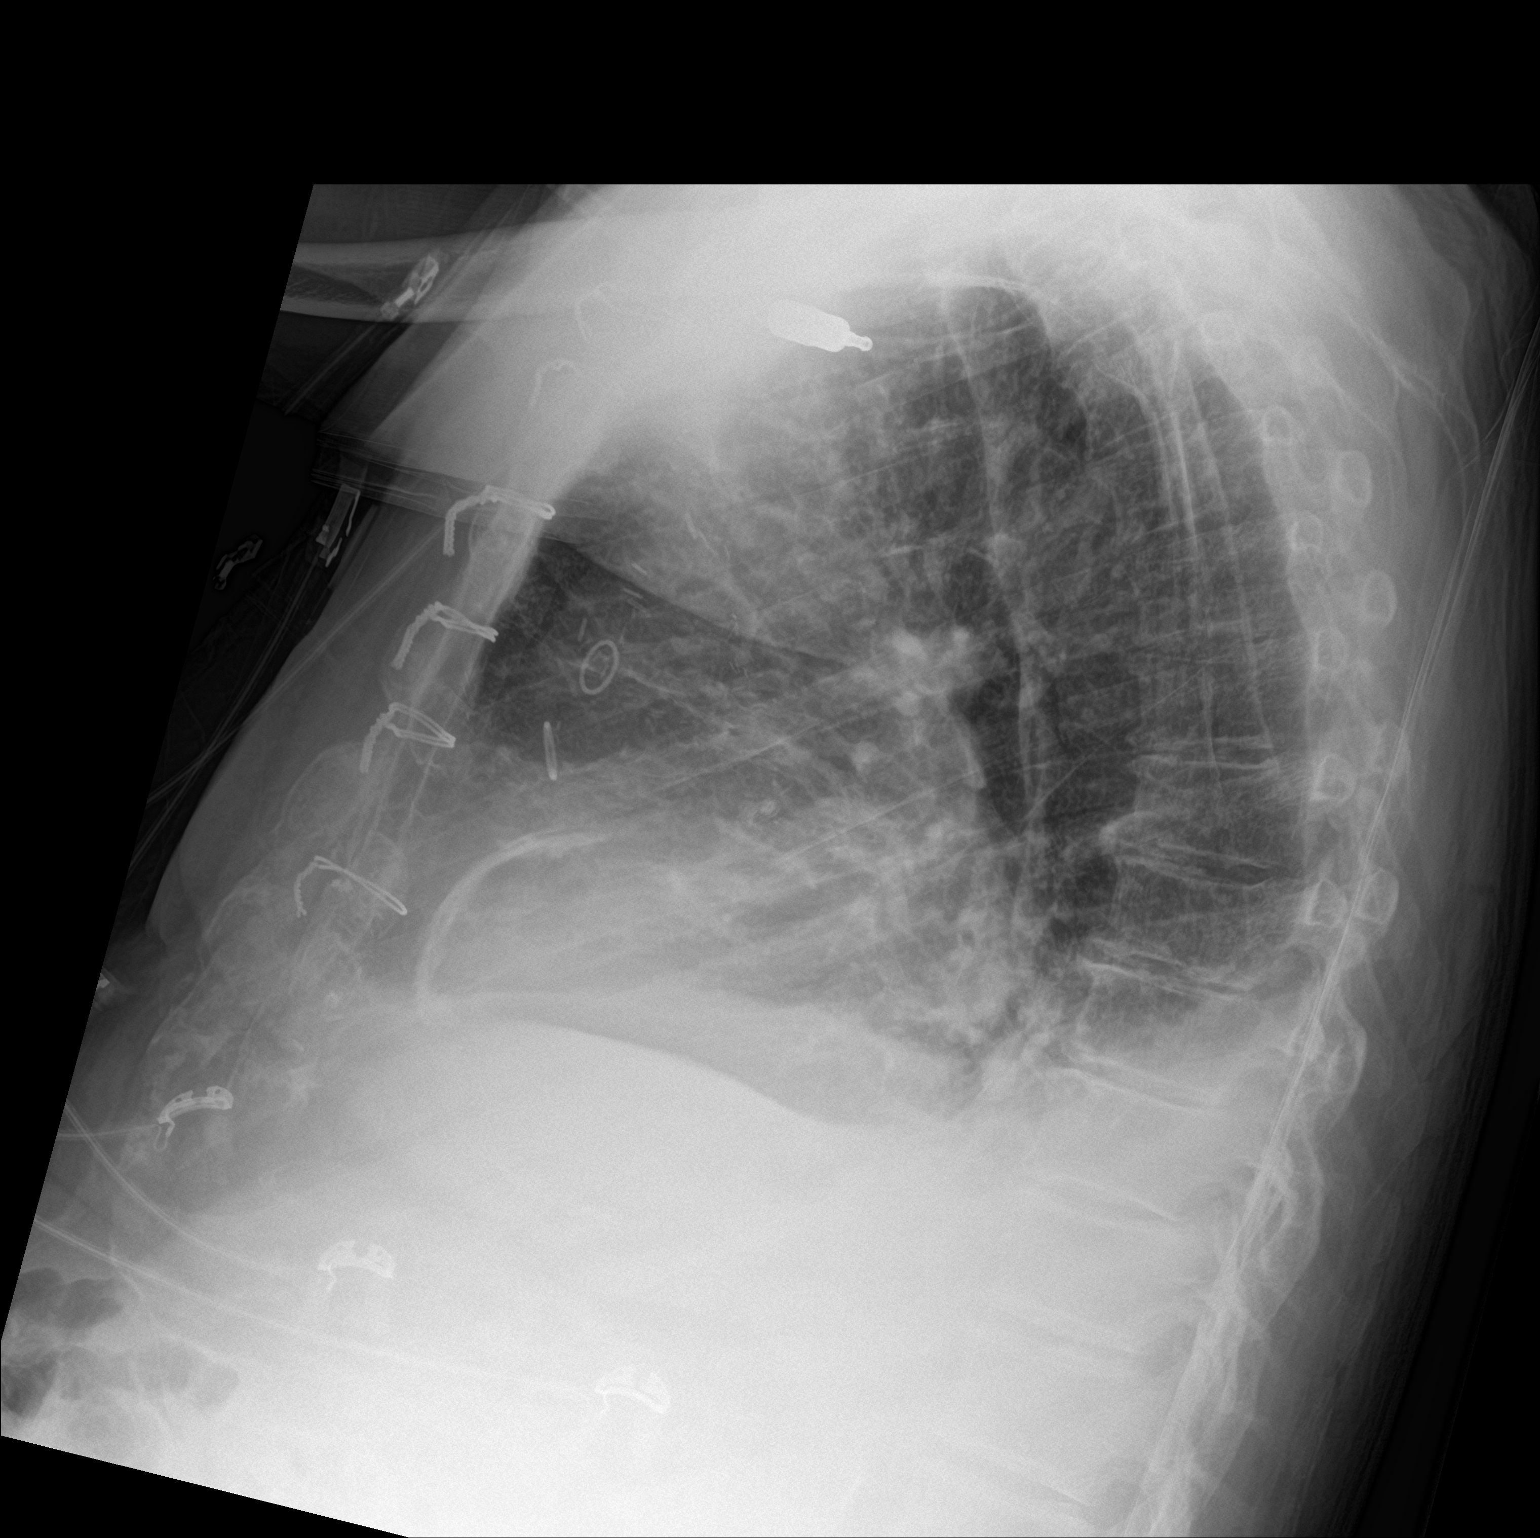

[chest ap]
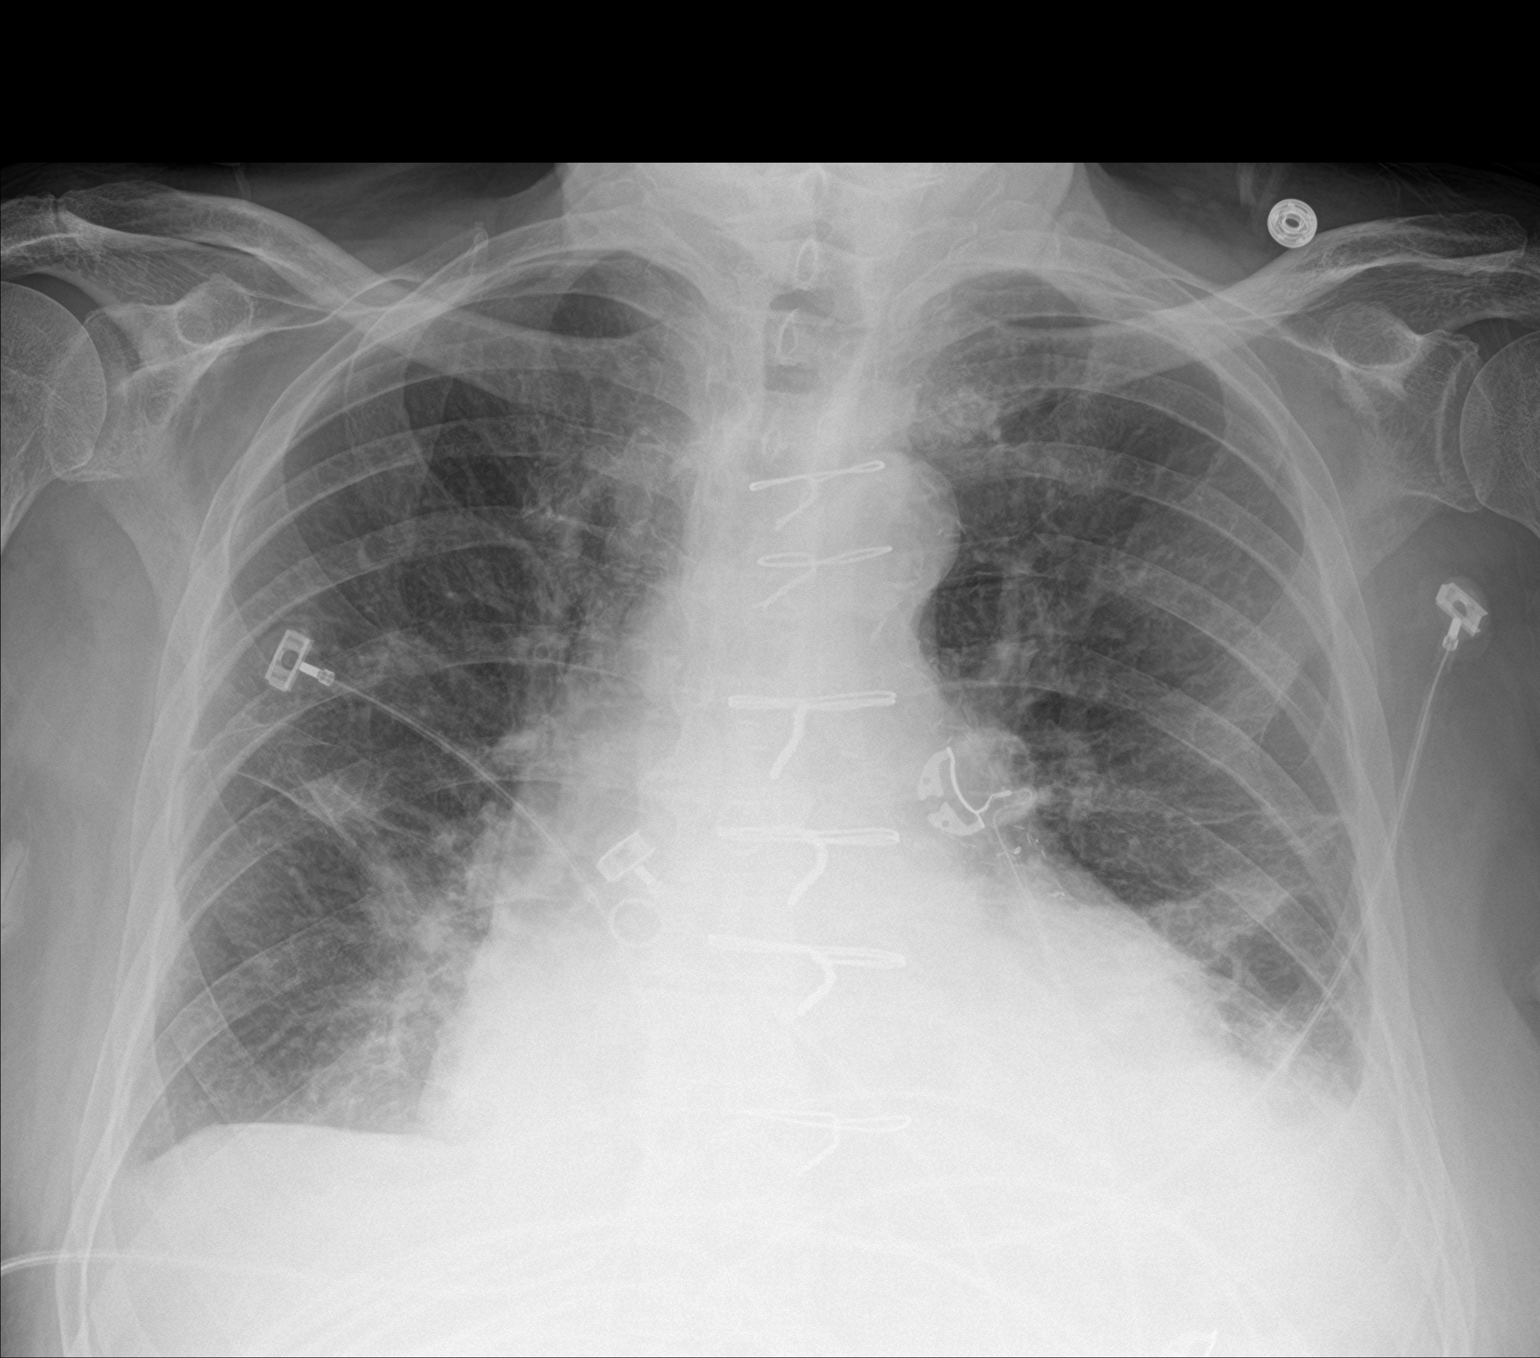

[2 of 2 positions shown; findings below may reference images not displayed]

FINDINGS: Sequelae of prior CABG are again identified. The cardiac silhouette
remains enlarged. Aortic atherosclerosis is noted. Bibasilar
opacities are again seen. Right basilar aeration has improved,
however there is persistent patchy airspace and mild interstitial
opacity in the left lung base. Small bilateral pleural effusions
persist, likely slightly decreased in size on the right. No
pneumothorax is identified. No acute osseous abnormality is seen.
IMPRESSION: 1. Improved aeration of the right lung base.
2. Persistent left and residual right basilar opacities may reflect
atelectasis, asymmetric edema, or infection.
3. Small pleural effusions, slightly decreased in size on the right.

## 2021-04-08 DEATH — deceased
